# Patient Record
Sex: Female | Born: 1935 | Race: White | Hispanic: No | Marital: Married | Smoking: Never smoker
Health system: Southern US, Community
[De-identification: ages and names within clinical notes are randomized; demographics above are authoritative.]

## PROBLEM LIST (undated history)

## (undated) DIAGNOSIS — N393 Stress incontinence (female) (male): Secondary | ICD-10-CM

## (undated) DIAGNOSIS — M1990 Unspecified osteoarthritis, unspecified site: Secondary | ICD-10-CM

## (undated) DIAGNOSIS — S83289A Other tear of lateral meniscus, current injury, unspecified knee, initial encounter: Secondary | ICD-10-CM

## (undated) DIAGNOSIS — M25461 Effusion, right knee: Secondary | ICD-10-CM

## (undated) DIAGNOSIS — I10 Essential (primary) hypertension: Secondary | ICD-10-CM

## (undated) DIAGNOSIS — I739 Peripheral vascular disease, unspecified: Secondary | ICD-10-CM

## (undated) DIAGNOSIS — H269 Unspecified cataract: Secondary | ICD-10-CM

## (undated) DIAGNOSIS — Z8673 Personal history of transient ischemic attack (TIA), and cerebral infarction without residual deficits: Secondary | ICD-10-CM

## (undated) DIAGNOSIS — I341 Nonrheumatic mitral (valve) prolapse: Secondary | ICD-10-CM

## (undated) DIAGNOSIS — E785 Hyperlipidemia, unspecified: Secondary | ICD-10-CM

## (undated) DIAGNOSIS — K219 Gastro-esophageal reflux disease without esophagitis: Secondary | ICD-10-CM

## (undated) HISTORY — PX: BREAST CYST EXCISION: SHX579

## (undated) SURGERY — HEMIARTHROPLASTY, HIP, DIRECT ANTERIOR APPROACH, FOR FRACTURE
Anesthesia: Choice | Laterality: Right

---

## 1971-07-21 HISTORY — PX: VAGINAL HYSTERECTOMY: SUR661

## 1973-07-20 HISTORY — PX: CHOLECYSTECTOMY: SHX55

## 1998-10-29 ENCOUNTER — Encounter: Payer: Self-pay | Admitting: Internal Medicine

## 1998-10-29 ENCOUNTER — Ambulatory Visit (HOSPITAL_COMMUNITY): Admission: RE | Admit: 1998-10-29 | Discharge: 1998-10-29 | Payer: Self-pay | Admitting: Internal Medicine

## 1999-05-04 ENCOUNTER — Emergency Department (HOSPITAL_COMMUNITY): Admission: EM | Admit: 1999-05-04 | Discharge: 1999-05-04 | Payer: Self-pay | Admitting: Emergency Medicine

## 1999-05-27 ENCOUNTER — Encounter: Payer: Self-pay | Admitting: Internal Medicine

## 1999-05-27 ENCOUNTER — Encounter: Admission: RE | Admit: 1999-05-27 | Discharge: 1999-05-27 | Payer: Self-pay | Admitting: Internal Medicine

## 2000-01-26 ENCOUNTER — Ambulatory Visit (HOSPITAL_COMMUNITY): Admission: RE | Admit: 2000-01-26 | Discharge: 2000-01-26 | Payer: Self-pay | Admitting: Internal Medicine

## 2000-01-26 ENCOUNTER — Encounter: Payer: Self-pay | Admitting: Internal Medicine

## 2001-01-26 ENCOUNTER — Ambulatory Visit (HOSPITAL_COMMUNITY): Admission: RE | Admit: 2001-01-26 | Discharge: 2001-01-26 | Payer: Self-pay | Admitting: Internal Medicine

## 2001-01-26 ENCOUNTER — Encounter: Payer: Self-pay | Admitting: Internal Medicine

## 2002-05-19 ENCOUNTER — Encounter: Payer: Self-pay | Admitting: Family Medicine

## 2002-05-19 ENCOUNTER — Ambulatory Visit (HOSPITAL_COMMUNITY): Admission: RE | Admit: 2002-05-19 | Discharge: 2002-05-19 | Payer: Self-pay | Admitting: Family Medicine

## 2003-08-15 ENCOUNTER — Ambulatory Visit (HOSPITAL_COMMUNITY): Admission: RE | Admit: 2003-08-15 | Discharge: 2003-08-15 | Payer: Self-pay | Admitting: Internal Medicine

## 2003-10-14 ENCOUNTER — Inpatient Hospital Stay (HOSPITAL_COMMUNITY): Admission: EM | Admit: 2003-10-14 | Discharge: 2003-10-17 | Payer: Self-pay | Admitting: Emergency Medicine

## 2003-10-16 ENCOUNTER — Encounter (INDEPENDENT_AMBULATORY_CARE_PROVIDER_SITE_OTHER): Payer: Self-pay | Admitting: *Deleted

## 2004-01-24 ENCOUNTER — Ambulatory Visit (HOSPITAL_COMMUNITY): Admission: RE | Admit: 2004-01-24 | Discharge: 2004-01-24 | Payer: Self-pay | Admitting: Internal Medicine

## 2004-09-26 ENCOUNTER — Ambulatory Visit (HOSPITAL_COMMUNITY): Admission: RE | Admit: 2004-09-26 | Discharge: 2004-09-26 | Payer: Self-pay | Admitting: Internal Medicine

## 2005-06-24 ENCOUNTER — Encounter: Admission: RE | Admit: 2005-06-24 | Discharge: 2005-06-24 | Payer: Self-pay | Admitting: Internal Medicine

## 2005-10-20 ENCOUNTER — Encounter: Admission: RE | Admit: 2005-10-20 | Discharge: 2005-10-20 | Payer: Self-pay | Admitting: Internal Medicine

## 2005-11-02 ENCOUNTER — Ambulatory Visit (HOSPITAL_COMMUNITY): Admission: RE | Admit: 2005-11-02 | Discharge: 2005-11-02 | Payer: Self-pay | Admitting: Internal Medicine

## 2006-12-01 ENCOUNTER — Ambulatory Visit (HOSPITAL_COMMUNITY): Admission: RE | Admit: 2006-12-01 | Discharge: 2006-12-01 | Payer: Self-pay | Admitting: Internal Medicine

## 2006-12-28 ENCOUNTER — Encounter: Admission: RE | Admit: 2006-12-28 | Discharge: 2006-12-28 | Payer: Self-pay | Admitting: Internal Medicine

## 2008-04-11 ENCOUNTER — Ambulatory Visit (HOSPITAL_COMMUNITY): Admission: RE | Admit: 2008-04-11 | Discharge: 2008-04-11 | Payer: Self-pay | Admitting: Internal Medicine

## 2009-12-05 ENCOUNTER — Ambulatory Visit (HOSPITAL_COMMUNITY): Admission: RE | Admit: 2009-12-05 | Discharge: 2009-12-05 | Payer: Self-pay | Admitting: Internal Medicine

## 2009-12-12 ENCOUNTER — Encounter: Admission: RE | Admit: 2009-12-12 | Discharge: 2009-12-12 | Payer: Self-pay | Admitting: Internal Medicine

## 2010-08-09 ENCOUNTER — Encounter: Payer: Self-pay | Admitting: Internal Medicine

## 2010-08-10 ENCOUNTER — Encounter: Payer: Self-pay | Admitting: Internal Medicine

## 2010-12-05 NOTE — Consult Note (Signed)
NAMETAMICO, MUNDO                           ACCOUNT NO.:  [id number]   MEDICAL RECORD NO.:  [id number]                   PATIENT TYPE:  INP   LOCATION:  3030                                 FACILITY:  [facility]   PHYSICIAN:  Meade Maw, M.D.                 DATE OF BIRTH:  [birth year]   DATE OF CONSULTATION:  DATE OF DISCHARGE:                                   CONSULTATION   REFERRING PHYSICIAN:  Pramod P. Pearlean Brownie, MD   INDICATION FOR CONSULTATION:  Recent embolic stroke evaluation, per cardiac  source.   Lindsey Barnett is a 77 year old female who on October 14, 2003, presented with  sudden onset of right-sided hemiparesis. She was diagnosed with a left CVA.  Apparently she had a syncopal episode in November and December 2004, and was  never evaluated. She had no prior awareness of palpitations or  tachyarrhythmias prior to her syncopal episodes. She has had a previous  cardiac evaluation by Dr. Verdis Prime with a left heart catheterization in  1998. This was performed for chest pain and at this time she was found to  have normal course, normal EF at 76%. She has had recent fatigue and dyspnea  on exertion that has progressed over the past six months. She also notes a  generalized inferior chest tenderness almost daily. She feels that she is  increasingly fatigued at the end of a busy day. This episode of right  hemiparesis occurred while she was standing and cooking in her kitchen.  She  has been independent in performing her housework.   PAST MEDICAL HISTORY:  1. Hypertension.  2. GERD.  3. Status post cholecystectomy.  4. Status post hysterectomy .   OUTPATIENT MEDICATIONS:  1. Diovan 320 mg daily.  2. Prilosec p.r.n.  3. Aspirin 325 mg daily.  4. Premarin.   ALLERGIES:  CONTRAST DYE and CODEINE.   CURRENT MEDICATIONS:  1. Diovan 320 mg daily.  2. Restoril 2 mg p.o. q.h.s. p.r.n.  3. Tylenol p.r.n.  4. Reglan p.r.n.   FAMILY HISTORY:  Father passed in 36 from a  myocardial infarction. Mother  passed in 82 from multiple myocardial infarctions. One brother passed from  a myocardial infarction. One sister passed with myocardial infarction. She  has one son who has hypertension and dyslipidemia.   SOCIAL HISTORY:  She was married, mother of three, and retired from  __________ [place]. No history of tobacco.   REVIEW OF SYSTEMS:  She has had fatigue, chest tightness as noted, dyspnea  with exertion. No change in bowel or bladder habits. No peripheral edema.   PHYSICAL EXAMINATION:  GENERAL: A large female in no acute distress,  pleasant, conversation is appropriate.  VITAL SIGNS: Blood pressure 146/83, heart rate 55, respiratory rate 20, O2  saturation is 92% on room air.  HEENT:  Unremarkable.  NECK: Good carotid upstrokes. No carotid bruits.  PULMONARY: Breath sounds  that are equal and clear to auscultation. No use of  accessory muscles.  CARDIOVASCULAR: Normal S1 and S2. Regular rate and rhythm. No murmurs, rubs,  or gallops.  ABDOMEN: Soft and nontender.  No unusual bruits or pulsations are noted.  EXTREMITIES: No peripheral edema.  SKIN: Warm and dry.  NEUROLOGIC: Nonfocal.   ECG reveals sinus bradycardia at 57 per minute. Telemetry reveals sinus  rhythm at 50-60. Chest x-ray reveals no acute disease. CT scan of the head  is normal.  2-D echo is pending. MRA of the head reveals left hemispheric  CVA.   LABORATORY DATA:  White count 6.4, hospital course 37, platelet count  292,000. Potassium 3.6, creatinine 0.8, glucose 92, PTT 32, cysteine levels  pending. LFTs normal.   IMPRESSION:  1. Left cerebrovascular accident, treatment per radiology. The patient is     currently on IV heparin.  2. Chest pain, dyspnea on exertion. We will schedule for an adenosine     Cardiolite. Check her TSH.  3. Hypertension. Blood pressure is marginally controlled. We will continue     to monitor.  4. Syncope of questionable etiology. A stress  Cardiolite and echo as noted     above. Will defer to neurology as to whether the patient needs a carotid     study.                                               Meade Maw, M.D.    HP/MEDQ  D:  10/15/2003  T:  10/15/2003  Job:  [id number]   cc:   Pramod P. Pearlean Brownie, MD  Fax: [phone]   Lyn Records III, M.D.  [address]  [address]  [place]  [place] [address]  Fax: [phone]

## 2010-12-05 NOTE — H&P (Signed)
NAME:  TERI, LEGACY                           ACCOUNT NO.:  [id number]   MEDICAL RECORD NO.:  [id number]                   PATIENT TYPE:  INP   LOCATION:  3030                                 FACILITY:  [facility]   PHYSICIAN:  Marlan Palau, M.D.               DATE OF BIRTH:  1934-08-18   DATE OF ADMISSION:  10/14/2003  DATE OF DISCHARGE:                                HISTORY & PHYSICAL   HISTORY OF PRESENT ILLNESS:  Lindsey Barnett is a 76 year old, right handed  female born 1935-06-24 with a history of hypertension.  This patient  has been on chronic Premarin therapy and comes in today for an evaluation of  sudden onset of right sided numbness and weakness that began at home while  cooking.  The patient had maximal deficit at onset, was unable to walk  initially, denies headache, denies visual field changes and she did have  trouble with speech.  The patient gradually improved over one hour to an  hour and a half with only minimal residual tingling sensations on the left  forearm and left leg.  The patient has regained her strength and most of her  speech back.  The patient denies any similar episodes like this in the past.  CT scan of the brain is unremarkable on admission.  This patient comes into  the hospital for evaluation of a probable embolic TIA to the left brain.   PAST MEDICAL HISTORY:  1. History of new onset of the right hemiparesis/right hemisensory deficit     that is resolving, consistent with an embolic TIA.  2. Hypertension.  3. Gastroesophageal reflux disease.  4. Status post gallbladder surgery.  5. History of hysterectomy.   MEDICATIONS:  1. Diovan, unknown dose.  2. Premarin 0.9 mg daily.  3. Aspirin 81 mg a day.   ALLERGIES:  The patient states now CODEINE and IVP DYE.   She does not smoke or drink.   SOCIAL HISTORY:  This patient is married, lives in the [place] area.  She  has three children, one son age 4 with an MI.  She has another son  with  hypercholesterolemia.   FAMILY HISTORY:  Mother died with an MI.  Father died with an MI.  She has a  brother who died with an MI and a sister who died with an MI.  She has  another brother who is living with Crohn's disease.  Two sisters living, one  with multiple hip surgeries.   REVIEW OF SYMPTOMS:  Noted for no fever or chills.  The patient has had  problems with cough associated with gastroesophageal reflux disease.  She  coughs at night.  She does note some shortness of breath and dyspnea on  exertion.  She denies chest pain.  She has had pressure sensations in her  upper neck today.  She denies abdominal pain.  She has had  some problems  with constipation in the past.  She denies any trouble controlling the  bladder.  The patient has had a near syncopal or syncopal event in early  December 2004.   PHYSICAL EXAMINATION:  VITAL SIGNS:  Blood pressure 179/89; heart rate 58;  respiratory rate 18; temperature afebrile.  GENERAL:  This patient is a fairly well-developed white female who is alert  and cooperative at the time of the examination.  HEENT:  Atraumatic eyes.  Pupils equal, round and reactive to light.  Discs  are flat bilaterally.  NECK:  Supple.  No carotid bruits.  RESPIRATORY:  Clear.  CARDIOVASCULAR:  Regular rate and rhythm.  No obvious murmurs or rubs noted.  ABDOMEN:  Positive bowel sounds.  No organomegaly or tenderness noted.  EXTREMITIES:  Without significant edema.  NEUROLOGIC:  Cranial nerves as above.  Facial symmetry is present.  The  patient has good sensation and states pinprick and soft touch bilaterally.  There is good strength in the facial muscles and the muscles of the head,  trunk and shoulders bilaterally.  Speech is well enunciated and not aphasic.  Motor testing reveals 5/5 strength in all fours.  Good symmetric motor tones  noted throughout.  Sensory testing reveals minimal change in the left  forearm and left pretibial area on the right  compared to the left, but  vibratory sensation is symmetric in all fours.  The patient has good finger-  nose-finger and toe to finger bilaterally.  The patient was not ambulated.  No drift is seen.  Deep tendon reflexes remain symmetric and normal.  Toes  are downgoing bilaterally.   CT of the head is unremarkable as above.  Laboratory values reveal a sodium  of 138, potassium 3.6, chloride of 106, CO2 26, glucose 92, BUN of 10,  creatinine of 0.8, calcium 9.3, total protein 6.5, albumin 3.6, AST 27, ALT  of 19, alkaline phosphatase 45, total bili 0.8.  White count of 6.2,  hemoglobin 13.9, hematocrit 40.4, MCV of 89.5, platelets of 292.  INR of  0.9.   EKG reveals sinus bradycardia, first degree A-V block with a heart rate of  57, cannot rule out anterior infarct, __________ undetermined.  Chest x-ray  is pending.   IMPRESSION:  1. Probable transient ischemic attack event, embolic, involving the left     brain.  2. Hypertension.   This patient has had a resolving deficit at this point.  There is subjective  numbness of the right arm and leg, but for the most part, she is back to  normal.  Speech is somewhat hesitant, but not truly aphasic.  She has good  repetition, naming.  The patient will come in for further evaluation at this  point.   PLAN:  1. Admit to [facility], monitor bed.  2. MI of the brain.  3. MI angiogram intracranial and extracranial vessels.  4. A 2D echocardiogram.  5. IV heparin therapy.  I will follow the patient's course while in house.                                                Marlan Palau, M.D.    CKW/MEDQ  D:  10/14/2003  T:  10/15/2003  Job:  [id number]   cc:   Candyce Churn, M.D.  [address] [place]  [place] [address]  Fax: [phone]   [facility]  [address]  [address]

## 2010-12-05 NOTE — Discharge Summary (Signed)
NAME:  Lindsey, Barnett                           ACCOUNT NO.:  [id number]   MEDICAL RECORD NO.:  [id number]                   PATIENT TYPE:  INP   LOCATION:  3030                                 FACILITY:  [facility]   PHYSICIAN:  Pramod P. Pearlean Brownie, MD                 DATE OF BIRTH:  08/24/1934   DATE OF ADMISSION:  10/14/2003  DATE OF DISCHARGE:  10/17/2003                                 DISCHARGE SUMMARY   DIAGNOSES AT TIME OF DISCHARGE:  1. Right hemispheric transient ischemic attack.  2. Syncope, workup negative.  3. Chest pain and dyspnea on exertion, workup negative.  4. Hypokalemia, resolved.  5. Asymptomatic mild bradycardia.  6. Hyperlipidemia.  7. Hypertension.  8. Gastroesophageal reflux disease.  9. Status post cholecystectomy.  10.      Status post hysterectomy.   MEDICATIONS AT TIME OF DISCHARGE:  1. Aggrenox 1 daily x1 week, then increase to b.i.d.  2. Diovan 320 mg daily.  3. Hydrochlorothiazide 12.5 mg a day.  4. Zocor 20 mg a day.  5. Premarin as prior to admission.  6. Prilosec p.r.n.  7. Tylenol p.r.n. recommended taking prior to Aggrenox to prevent headache.   STUDIES PERFORMED:  1. CT of the head on admission was normal.  2. MRI of the brain was negative for acute infarct.  3. MRA of the brain was negative.  4. MRA of the neck was negative.  5. Chest x-ray showed no active disease.  6. Two-dimensional echocardiogram showed EF of 55% to 65% with no LV     regional wall motion abnormalities, no embolic source.  7. Transcranial Doppler not performed.  8. Carotid Doppler not performed.  9. EKG showed sinus bradycardia with first-degree A-V block, low-voltage     QRS, cannot rule out anterior infarct, age undetermined.  10.      Persantine Cardiolite shows significant gut uptake affecting     imaging, no ischemia, normal echo, no ST-T changes, no ectopy.   LABORATORY STUDIES:  Hemoglobin 13.9, hematocrit 41.2, white blood cells  7.33, platelets 323,000.   Heparin 0.57.  Chemistries normal.  Calcium  normal.  TSH normal.  Homocysteine normal.  Cholesterol 229, triglycerides  46, HDL 71 and LDL 149.  Liver function tests normal.   HISTORY OF PRESENT ILLNESS:  [name] is 76 year old right-handed  female with a history of hypertension.  She presents today for evaluation of  sudden-onset right-sided numbness and weakness that began at home while  cooking.  At maximal deficit, the patient was unable to walk initially,  denied headache, denied visual fields changes, but she did have trouble with  speech.  She gradually improved over 1 hour to 1 hour and a half with only  minimal residual tingling in her left forearm and left leg.  She has  regained most of her strength and speech back.  She denies similar episodes  in the past.  CT of the head was negative for hemorrhage.  She was admitted  for further stroke workup.  She was not a TPA or St. Jude candidate  secondary to lack of severity of symptoms.   HOSPITAL COURSE:  MRI was negative for acute stroke and it was felt that she  may have had a  TIA.  The patient was on aspirin prior to admission, so was  changed to Aggrenox for secondary stroke prevention.  Other risk factors  identified during hospitalization included hyperlipidemia for which she was  started on Zocor.  No other risk factors found.   The patient did have a history of recent syncope in the past that had not  been evaluated so a cardiology consult was called with Dr. Lyn Records,  who evaluated the patient.  Decision was to do a Persantine Cardiolite to  check for ischemia, which was negative, showing no ischemia or ectopy.  As  such, she needed no further cardiology workup.  Only addition made was that  of hydrochlorothiazide 12.5 mg daily.   CONDITION AT DISCHARGE:  Condition at discharge neurologically normal.   DISCHARGE PLAN:  1. Discharge home, no therapies needed.  2. Aggrenox for secondary stroke  prevention.  3. New statin for cholesterol control.  Will need followup in 6 weeks.  4. Follow up with Dr. Pearlean Brownie in 2 months; she is to call for an appointment.  5. Follow up with Dr. Kevan Ny in 1 month for cholesterol control.      Annie Main, N.P.                         Pramod P. Pearlean Brownie, MD    SB/MEDQ  D:  10/17/2003  T:  10/18/2003  Job:  [id number]   cc:   Pramod P. Pearlean Brownie, MD  Fax: [phone]   Candyce Churn, M.D.  [address] [place]  [place] [address]  Fax: [phone]   Lyn Records III, M.D.  [address]  [place]  [place] [address]  Fax: [phone]

## 2011-01-26 ENCOUNTER — Other Ambulatory Visit: Payer: Self-pay | Admitting: Internal Medicine

## 2011-01-26 DIAGNOSIS — Z1231 Encounter for screening mammogram for malignant neoplasm of breast: Secondary | ICD-10-CM

## 2011-01-29 ENCOUNTER — Ambulatory Visit
Admission: RE | Admit: 2011-01-29 | Discharge: 2011-01-29 | Disposition: A | Discharge status: Home / Self Care | Payer: Medicare Other | Source: Ambulatory Visit | Attending: Internal Medicine | Admitting: Internal Medicine

## 2011-01-29 DIAGNOSIS — Z1231 Encounter for screening mammogram for malignant neoplasm of breast: Secondary | ICD-10-CM

## 2011-02-03 ENCOUNTER — Other Ambulatory Visit: Payer: Self-pay | Admitting: Internal Medicine

## 2011-02-03 DIAGNOSIS — R928 Other abnormal and inconclusive findings on diagnostic imaging of breast: Secondary | ICD-10-CM

## 2011-02-04 ENCOUNTER — Ambulatory Visit
Admission: RE | Admit: 2011-02-04 | Discharge: 2011-02-04 | Disposition: A | Discharge status: Home / Self Care | Payer: Medicare Other | Source: Ambulatory Visit | Attending: Internal Medicine | Admitting: Internal Medicine

## 2011-02-04 DIAGNOSIS — R928 Other abnormal and inconclusive findings on diagnostic imaging of breast: Secondary | ICD-10-CM

## 2011-07-03 ENCOUNTER — Other Ambulatory Visit: Payer: Self-pay | Admitting: Internal Medicine

## 2011-07-03 DIAGNOSIS — N6009 Solitary cyst of unspecified breast: Secondary | ICD-10-CM

## 2011-08-05 ENCOUNTER — Ambulatory Visit
Admission: RE | Admit: 2011-08-05 | Discharge: 2011-08-05 | Disposition: A | Discharge status: Home / Self Care | Payer: Medicare Other | Source: Ambulatory Visit | Attending: Internal Medicine | Admitting: Internal Medicine

## 2011-08-05 DIAGNOSIS — N6009 Solitary cyst of unspecified breast: Secondary | ICD-10-CM

## 2011-08-31 ENCOUNTER — Other Ambulatory Visit: Payer: Self-pay | Admitting: Orthopedic Surgery

## 2011-09-11 ENCOUNTER — Encounter (HOSPITAL_BASED_OUTPATIENT_CLINIC_OR_DEPARTMENT_OTHER): Payer: Self-pay | Admitting: *Deleted

## 2011-09-11 NOTE — Progress Notes (Signed)
NPO AFTER MN. ARRIVES AT 0700. NEEDS ISTAT AND EKG. WILL TAKE PRILOSEC AM OF SURG W/ SIP OF WATER. 

## 2011-09-16 ENCOUNTER — Encounter (HOSPITAL_BASED_OUTPATIENT_CLINIC_OR_DEPARTMENT_OTHER): Payer: Self-pay | Admitting: *Deleted

## 2011-09-16 ENCOUNTER — Other Ambulatory Visit: Payer: Self-pay

## 2011-09-16 ENCOUNTER — Ambulatory Visit (HOSPITAL_BASED_OUTPATIENT_CLINIC_OR_DEPARTMENT_OTHER)
Admission: RE | Admit: 2011-09-16 | Discharge: 2011-09-16 | Disposition: A | Discharge status: Home / Self Care | Payer: Medicare Other | Source: Ambulatory Visit | Attending: Orthopedic Surgery | Admitting: Orthopedic Surgery

## 2011-09-16 ENCOUNTER — Encounter (HOSPITAL_BASED_OUTPATIENT_CLINIC_OR_DEPARTMENT_OTHER): Payer: Self-pay | Admitting: Anesthesiology

## 2011-09-16 ENCOUNTER — Encounter (HOSPITAL_BASED_OUTPATIENT_CLINIC_OR_DEPARTMENT_OTHER)
Admission: RE | Disposition: A | Discharge status: Home / Self Care | Payer: Self-pay | Source: Ambulatory Visit | Attending: Orthopedic Surgery

## 2011-09-16 ENCOUNTER — Ambulatory Visit (HOSPITAL_BASED_OUTPATIENT_CLINIC_OR_DEPARTMENT_OTHER): Payer: Medicare Other | Admitting: Anesthesiology

## 2011-09-16 DIAGNOSIS — M2240 Chondromalacia patellae, unspecified knee: Secondary | ICD-10-CM | POA: Insufficient documentation

## 2011-09-16 DIAGNOSIS — M25569 Pain in unspecified knee: Secondary | ICD-10-CM | POA: Insufficient documentation

## 2011-09-16 DIAGNOSIS — S83289A Other tear of lateral meniscus, current injury, unspecified knee, initial encounter: Secondary | ICD-10-CM | POA: Insufficient documentation

## 2011-09-16 DIAGNOSIS — K219 Gastro-esophageal reflux disease without esophagitis: Secondary | ICD-10-CM | POA: Insufficient documentation

## 2011-09-16 DIAGNOSIS — Z7982 Long term (current) use of aspirin: Secondary | ICD-10-CM | POA: Insufficient documentation

## 2011-09-16 DIAGNOSIS — E785 Hyperlipidemia, unspecified: Secondary | ICD-10-CM | POA: Insufficient documentation

## 2011-09-16 DIAGNOSIS — X58XXXA Exposure to other specified factors, initial encounter: Secondary | ICD-10-CM | POA: Insufficient documentation

## 2011-09-16 DIAGNOSIS — I10 Essential (primary) hypertension: Secondary | ICD-10-CM | POA: Insufficient documentation

## 2011-09-16 DIAGNOSIS — Z8673 Personal history of transient ischemic attack (TIA), and cerebral infarction without residual deficits: Secondary | ICD-10-CM | POA: Insufficient documentation

## 2011-09-16 DIAGNOSIS — Z79899 Other long term (current) drug therapy: Secondary | ICD-10-CM | POA: Insufficient documentation

## 2011-09-16 HISTORY — DX: Unspecified osteoarthritis, unspecified site: M19.90

## 2011-09-16 HISTORY — DX: Personal history of transient ischemic attack (TIA), and cerebral infarction without residual deficits: Z86.73

## 2011-09-16 HISTORY — DX: Hyperlipidemia, unspecified: E78.5

## 2011-09-16 HISTORY — DX: Essential (primary) hypertension: I10

## 2011-09-16 HISTORY — DX: Unspecified cataract: H26.9

## 2011-09-16 HISTORY — DX: Other tear of lateral meniscus, current injury, unspecified knee, initial encounter: S83.289A

## 2011-09-16 HISTORY — DX: Effusion, right knee: M25.461

## 2011-09-16 HISTORY — PX: KNEE ARTHROSCOPY: SHX127

## 2011-09-16 HISTORY — DX: Gastro-esophageal reflux disease without esophagitis: K21.9

## 2011-09-16 LAB — POCT I-STAT 4, (NA,K, GLUC, HGB,HCT)
Glucose, Bld: 84 mg/dL (ref 70–99)
HCT: 45 % (ref 36.0–46.0)
Hemoglobin: 15.3 g/dL — ABNORMAL HIGH (ref 12.0–15.0)
Potassium: 3.9 mEq/L (ref 3.5–5.1)
Sodium: 143 mEq/L (ref 135–145)

## 2011-09-16 SURGERY — ARTHROSCOPY, KNEE
Anesthesia: General | Site: Knee | Laterality: Right | Wound class: Clean

## 2011-09-16 MED ORDER — LIDOCAINE HCL (CARDIAC) 20 MG/ML IV SOLN
INTRAVENOUS | Status: DC | PRN
  Administered 2011-09-16: 80 mg via INTRAVENOUS

## 2011-09-16 MED ORDER — SODIUM CHLORIDE 0.9 % IR SOLN
Status: DC | PRN
  Administered 2011-09-16: 2

## 2011-09-16 MED ORDER — FENTANYL CITRATE 0.05 MG/ML IJ SOLN: 25.0000 ug | INTRAMUSCULAR | Status: DC | PRN

## 2011-09-16 MED ORDER — PROPOFOL 10 MG/ML IV EMUL
INTRAVENOUS | Status: DC | PRN
  Administered 2011-09-16: 180 mg via INTRAVENOUS

## 2011-09-16 MED ORDER — SODIUM CHLORIDE 0.9 % IV SOLN: INTRAVENOUS | Status: DC

## 2011-09-16 MED ORDER — METHOCARBAMOL 500 MG PO TABS
500.0000 mg | ORAL_TABLET | Freq: Three times a day (TID) | ORAL | Status: AC
  Administered 2011-09-16: 500 mg via ORAL

## 2011-09-16 MED ORDER — HYDROMORPHONE HCL 2 MG PO TABS
2.0000 mg | ORAL_TABLET | Freq: Four times a day (QID) | ORAL | Status: DC | PRN
  Administered 2011-09-16: 2 mg via ORAL

## 2011-09-16 MED ORDER — FENTANYL CITRATE 0.05 MG/ML IJ SOLN
INTRAMUSCULAR | Status: DC | PRN
  Administered 2011-09-16: 25 ug via INTRAVENOUS
  Administered 2011-09-16: 50 ug via INTRAVENOUS
  Administered 2011-09-16 (×2): 25 ug via INTRAVENOUS

## 2011-09-16 MED ORDER — HYDROMORPHONE HCL 2 MG PO TABS: 2.0000 mg | ORAL_TABLET | ORAL | Status: AC | PRN

## 2011-09-16 MED ORDER — DEXAMETHASONE SODIUM PHOSPHATE 10 MG/ML IJ SOLN
10.0000 mg | Freq: Once | INTRAMUSCULAR | Status: AC
  Administered 2011-09-16: 10 mg via INTRAVENOUS

## 2011-09-16 MED ORDER — METHOCARBAMOL 500 MG PO TABS: 500.0000 mg | ORAL_TABLET | Freq: Four times a day (QID) | ORAL | Status: AC

## 2011-09-16 MED ORDER — ONDANSETRON HCL 4 MG/2ML IJ SOLN
INTRAMUSCULAR | Status: DC | PRN
  Administered 2011-09-16: 4 mg via INTRAVENOUS

## 2011-09-16 MED ORDER — ACETAMINOPHEN 10 MG/ML IV SOLN
1000.0000 mg | Freq: Once | INTRAVENOUS | Status: AC
  Administered 2011-09-16: 1000 mg via INTRAVENOUS

## 2011-09-16 MED ORDER — CEFAZOLIN SODIUM 1-5 GM-% IV SOLN: 1.0000 g | INTRAVENOUS | Status: DC

## 2011-09-16 MED ORDER — TRAMADOL HCL 50 MG PO TABS: 50.0000 mg | ORAL_TABLET | Freq: Four times a day (QID) | ORAL | Status: AC | PRN

## 2011-09-16 MED ORDER — LACTATED RINGERS IV SOLN
INTRAVENOUS | Status: DC
  Administered 2011-09-16 (×2): via INTRAVENOUS

## 2011-09-16 MED ORDER — BUPIVACAINE-EPINEPHRINE 0.25% -1:200000 IJ SOLN
INTRAMUSCULAR | Status: DC | PRN
  Administered 2011-09-16: 20 mL

## 2011-09-16 MED ORDER — CHLORHEXIDINE GLUCONATE 4 % EX LIQD: 60.0000 mL | Freq: Once | CUTANEOUS | Status: DC

## 2011-09-16 MED ORDER — TRAMADOL HCL 50 MG PO TABS
50.0000 mg | ORAL_TABLET | Freq: Four times a day (QID) | ORAL | Status: DC | PRN
  Administered 2011-09-16: 50 mg via ORAL

## 2011-09-16 MED ORDER — PROMETHAZINE HCL 25 MG/ML IJ SOLN: 6.2500 mg | INTRAMUSCULAR | Status: DC | PRN

## 2011-09-16 SURGICAL SUPPLY — 36 items
BANDAGE ELASTIC 6 VELCRO ST LF (GAUZE/BANDAGES/DRESSINGS) ×2 IMPLANT
BLADE 4.2CUDA (BLADE) ×2 IMPLANT
BLADE CUDA SHAVER 3.5 (BLADE) IMPLANT
BLADE CUTTER GATOR 3.5 (BLADE) IMPLANT
CANISTER SUCT LVC 12 LTR MEDI- (MISCELLANEOUS) ×2 IMPLANT
CANISTER SUCTION 2500CC (MISCELLANEOUS) IMPLANT
CLOTH BEACON ORANGE TIMEOUT ST (SAFETY) ×2 IMPLANT
DRAPE ARTHROSCOPY W/POUCH 114 (DRAPES) ×2 IMPLANT
DRSG EMULSION OIL 3X3 NADH (GAUZE/BANDAGES/DRESSINGS) ×2 IMPLANT
DRSG PAD ABDOMINAL 8X10 ST (GAUZE/BANDAGES/DRESSINGS) ×2 IMPLANT
DURAPREP 26ML APPLICATOR (WOUND CARE) ×2 IMPLANT
ELECT MENISCUS 165MM 90D (ELECTRODE) IMPLANT
ELECT REM PT RETURN 9FT ADLT (ELECTROSURGICAL)
ELECTRODE REM PT RTRN 9FT ADLT (ELECTROSURGICAL) IMPLANT
GAUZE SPONGE 4X4 12PLY STRL LF (GAUZE/BANDAGES/DRESSINGS) ×2 IMPLANT
GLOVE BIO SURGEON STRL SZ8 (GLOVE) ×2 IMPLANT
GLOVE INDICATOR 6.5 STRL GRN (GLOVE) ×4 IMPLANT
GLOVE INDICATOR 8.0 STRL GRN (GLOVE) ×2 IMPLANT
GOWN PREVENTION PLUS LG XLONG (DISPOSABLE) ×4 IMPLANT
IV NS IRRIG 3000ML ARTHROMATIC (IV SOLUTION) ×2 IMPLANT
KNEE WRAP E Z 3 GEL PACK (MISCELLANEOUS) ×2 IMPLANT
PACK ARTHROSCOPY DSU (CUSTOM PROCEDURE TRAY) ×2 IMPLANT
PACK BASIN DAY SURGERY FS (CUSTOM PROCEDURE TRAY) ×2 IMPLANT
PADDING CAST ABS 4INX4YD NS (CAST SUPPLIES) ×1
PADDING CAST ABS COTTON 4X4 ST (CAST SUPPLIES) ×1 IMPLANT
PADDING CAST COTTON 6X4 STRL (CAST SUPPLIES) ×2 IMPLANT
PADDING WEBRIL 6 STERILE (GAUZE/BANDAGES/DRESSINGS) ×2 IMPLANT
PENCIL BUTTON HOLSTER BLD 10FT (ELECTRODE) IMPLANT
SET ARTHROSCOPY TUBING (MISCELLANEOUS) ×1
SET ARTHROSCOPY TUBING LN (MISCELLANEOUS) ×1 IMPLANT
SPONGE GAUZE 4X4 12PLY (GAUZE/BANDAGES/DRESSINGS) ×2 IMPLANT
SUT ETHILON 4 0 PS 2 18 (SUTURE) ×2 IMPLANT
TOWEL OR 17X24 6PK STRL BLUE (TOWEL DISPOSABLE) ×2 IMPLANT
WAND 30 DEG SABER W/CORD (SURGICAL WAND) IMPLANT
WAND 90 DEG TURBOVAC W/CORD (SURGICAL WAND) ×2 IMPLANT
WATER STERILE IRR 500ML POUR (IV SOLUTION) ×2 IMPLANT

## 2011-09-16 NOTE — Anesthesia Procedure Notes (Signed)
Procedure Name: LMA Insertion Date/Time: 09/16/2011 9:10 AM Performed by: Lorrin Jackson Pre-anesthesia Checklist: Patient identified, Emergency Drugs available, Suction available and Patient being monitored Patient Re-evaluated:Patient Re-evaluated prior to inductionOxygen Delivery Method: Circle System Utilized Preoxygenation: Pre-oxygenation with 100% oxygen Intubation Type: IV induction Ventilation: Mask ventilation without difficulty LMA: LMA with gastric port inserted LMA Size: 4.0 Number of attempts: 1 Placement Confirmation: positive ETCO2 Tube secured with: Tape Dental Injury: Teeth and Oropharynx as per pre-operative assessment  Comments: Inserted by Dr.Shuster

## 2011-09-16 NOTE — Anesthesia Postprocedure Evaluation (Signed)
  Anesthesia Post-op Note  Patient: Lindsey Barnett  Procedure(s) Performed: Procedure(s) (LRB): ARTHROSCOPY KNEE (Right)  Patient Location: PACU  Anesthesia Type: General  Level of Consciousness: oriented and sedated  Airway and Oxygen Therapy: Patient Spontanous Breathing  Post-op Pain: mild  Post-op Assessment: Post-op Vital signs reviewed, Patient's Cardiovascular Status Stable, Respiratory Function Stable and Patent Airway  Post-op Vital Signs: stable  Complications: No apparent anesthesia complications

## 2011-09-16 NOTE — Interval H&P Note (Signed)
History and Physical Interval Note:  09/16/2011 9:04 AM  Lindsey Barnett  has presented today for surgery, with the diagnosis of RIGHT KNEE LATERAL MENISCAL TEAR  The various methods of treatment have been discussed with the patient and family. After consideration of risks, benefits and other options for treatment, the patient has consented to  Procedure(s) (LRB): ARTHROSCOPY KNEE (Right) as a surgical intervention .  The patients' history has been reviewed, patient examined, no change in status, stable for surgery.  I have reviewed the patients' chart and labs.  Questions were answered to the patient's satisfaction.     Loanne Drilling

## 2011-09-16 NOTE — H&P (Signed)
  CC- Lindsey Barnett is a 77 y.o. female who presents with right knee pain.  HPI- . Knee Pain: Patient presents with knee pain involving the  right knee. Onset of the symptoms was several months ago. Inciting event: none known. Current symptoms include giving out, pain located laterally and stiffness. Pain is aggravated by rising after sitting, squatting and walking.  Patient has had no prior knee problems. Evaluation to date: MRI: abnormal lateral meniscal tear. Treatment to date: corticosteroid injection which was not very effective.  Past Medical History  Diagnosis Date  . Hypertension   . Hyperlipidemia   . History of TIA (transient ischemic attack) 2005--  NO RESIDUAL  . GERD (gastroesophageal reflux disease) OCCASIONAL  . Acute meniscal tear, lateral RIGHT  . Swelling of right knee joint   . Cataract immature   . Arthritis HIP    Past Surgical History  Procedure Date  . Cholecystectomy 1975  . Vaginal hysterectomy 1973    Prior to Admission medications   Medication Sig Start Date End Date Taking? Authorizing Provider  aspirin 81 MG tablet Take 81 mg by mouth daily.   Yes Historical Provider, MD  calcium carbonate (TUMS - DOSED IN MG ELEMENTAL CALCIUM) 500 MG chewable tablet Chew 1 tablet by mouth as needed.   Yes Historical Provider, MD  clopidogrel (PLAVIX) 75 MG tablet Take 75 mg by mouth daily.   Yes Historical Provider, MD  co-enzyme Q-10 30 MG capsule Take 30 mg by mouth 3 (three) times daily.   Yes Historical Provider, MD  estradiol (ESTRACE) 0.5 MG tablet Take 0.5 mg by mouth daily.   Yes Historical Provider, MD  Multiple Vitamin (MULTIVITAMIN) tablet Take 1 tablet by mouth daily.   Yes Historical Provider, MD  omeprazole (PRILOSEC) 20 MG capsule Take 20 mg by mouth as needed.   Yes Historical Provider, MD  rosuvastatin (CRESTOR) 10 MG tablet Take 10 mg by mouth daily.   Yes Historical Provider, MD  valsartan (DIOVAN) 160 MG tablet Take 160 mg by mouth daily.   Yes  Historical Provider, MD  vitamin B-12 (CYANOCOBALAMIN) 1000 MCG tablet Take 1,000 mcg by mouth 2 (two) times daily.   Yes Historical Provider, MD   Knee Exam antalgic gait, soft tissue tenderness over lateral joint line, collateral ligaments intact, negative Lachman sign, normal ipsilateral hip exam  Physical Examination: General appearance - alert, well appearing, and in no distress Mental status - alert, oriented to person, place, and time Chest - clear to auscultation, no wheezes, rales or rhonchi, symmetric air entry Heart - normal rate, regular rhythm, normal S1, S2, no murmurs, rubs, clicks or gallops Abdomen - soft, nontender, nondistended, no masses or organomegaly Neurological - alert, oriented, normal speech, no focal findings or movement disorder noted   Asessment/Plan---Right knee lateral meniscal tear- - Plan right knee arthroscopy with lateral meniscal debridement. Procedure risks and potential comps discussed with patient who elects to proceed. Goals are decreased pain and increased function with a high likelihood of achieving both

## 2011-09-16 NOTE — Op Note (Signed)
Preoperative diagnosis-  Right knee lateral meniscal tear  Postoperative diagnosis Right- knee lateral meniscal tear   Procedure- Right knee arthroscopy with lateral  Meniscal debridement   Surgeon- Gus Rankin. Vassie Kugel, MD  Anesthesia-General  EBL-  minimal Complications- None  Condition- PACU - hemodynamically stable.  Brief clinical note- -Lindsey Barnett is a 77 y.o.  female with a several month history of right knee pain and mechanical symptoms. Exam and history suggested lateral meniscal tear confirmed by MRI. The patient presents now for arthroscopy and debridement   Procedure in detail -       After successful administration of General anesthetic, a tourmiquet is placed high on the Right  thigh and the Right lower extremity is prepped and draped in the usual sterile fashion. Time out is performed by the surgical team. Standard superomedial and inferolateral portal sites are marked and incisions made with an 11 blade. The inflow cannula is passed through the superomedial portal and camera through the inferolateral portal and inflow is initiated. Arthroscopic visualization proceeds.      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 The medial and lateral gutters are visualized and there are  no loose bodies. Flexion and valgus force is applied to the knee and the medial compartment is entered. A spinal needle is passed into the joint through the site marked for the inferomedial portal. A small incision is made and the dilator passed into the joint. The findings for the medial compartment are normal .     The intercondylar notch is visualized and the ACL appears normal. 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 The tear is debrided to a stable  base with baskets and a shaver and sealed off with the Arthrocare. The cartilage on the femoral condyle was stable and did not need debridement.     The joint is again inspected and there are no other tears, defects or loose bodies identified. The arthroscopic equipment is then removed from the inferior portals which are closed with interrupted 4-0 nylon. 20 ml of .25% Marcaine with epinephrine are injected through the inflow cannula and the cannula is then removed and the portal closed with nylon. The incisions are cleaned and dried and a bulky sterile dressing is applied. The patient is then awakened and transported to recovery in stable condition.   09/16/2011, 9:42 AM

## 2011-09-16 NOTE — Anesthesia Preprocedure Evaluation (Signed)
Anesthesia Evaluation  Patient identified by MRN, date of birth, ID band Patient awake    Reviewed: Allergy & Precautions, H&P , NPO status , Patient's Chart, lab work & pertinent test results, reviewed documented beta blocker date and time   Airway Mallampati: II TM Distance: >3 FB Neck ROM: Full    Dental  (+) Teeth Intact and Dental Advisory Given   Pulmonary neg pulmonary ROS,  clear to auscultation        Cardiovascular hypertension, Pt. on medications Regular Normal Denies cardiac symptoms   Neuro/Psych Negative Neurological ROS  Negative Psych ROS   GI/Hepatic negative GI ROS, Neg liver ROS,   Endo/Other  Negative Endocrine ROS  Renal/GU negative Renal ROS  Genitourinary negative   Musculoskeletal negative musculoskeletal ROS (+)   Abdominal   Peds negative pediatric ROS (+)  Hematology negative hematology ROS (+)   Anesthesia Other Findings   Reproductive/Obstetrics negative OB ROS                           Anesthesia Physical Anesthesia Plan  ASA: II  Anesthesia Plan: General   Post-op Pain Management:    Induction: Intravenous  Airway Management Planned: LMA  Additional Equipment:   Intra-op Plan:   Post-operative Plan: Extubation in OR  Informed Consent:   Dental advisory given  Plan Discussed with: CRNA and Surgeon  Anesthesia Plan Comments:         Anesthesia Quick Evaluation

## 2011-09-16 NOTE — Transfer of Care (Signed)
Immediate Anesthesia Transfer of Care Note  Patient: Lindsey Barnett  Procedure(s) Performed: Procedure(s) (LRB): ARTHROSCOPY KNEE (Right)  Patient Location: Patient transported to PACU with oxygen via face mask at 4 Liters / Min  Anesthesia Type: General  Level of Consciousness: awake and alert   Airway & Oxygen Therapy: Patient Spontanous Breathing and Patient connected to face mask oxygen  Post-op Assessment: Report given to PACU RN and Post -op Vital signs reviewed and stable  Post vital signs: Reviewed and stable  Dentition: Teeth and oropharynx remain in pre-op condition  Complications: No apparent anesthesia complications

## 2011-09-17 ENCOUNTER — Encounter (HOSPITAL_BASED_OUTPATIENT_CLINIC_OR_DEPARTMENT_OTHER): Payer: Self-pay | Admitting: Orthopedic Surgery

## 2012-02-25 ENCOUNTER — Other Ambulatory Visit: Payer: Self-pay | Admitting: Internal Medicine

## 2012-02-25 DIAGNOSIS — N63 Unspecified lump in unspecified breast: Secondary | ICD-10-CM

## 2012-03-07 ENCOUNTER — Ambulatory Visit
Admission: RE | Admit: 2012-03-07 | Discharge: 2012-03-07 | Disposition: A | Discharge status: Home / Self Care | Payer: Medicare Other | Source: Ambulatory Visit | Attending: Internal Medicine | Admitting: Internal Medicine

## 2012-03-07 ENCOUNTER — Other Ambulatory Visit: Payer: Self-pay | Admitting: Internal Medicine

## 2012-03-07 DIAGNOSIS — N63 Unspecified lump in unspecified breast: Secondary | ICD-10-CM

## 2013-04-21 ENCOUNTER — Other Ambulatory Visit: Payer: Self-pay

## 2013-04-21 DIAGNOSIS — Z1231 Encounter for screening mammogram for malignant neoplasm of breast: Secondary | ICD-10-CM

## 2013-04-24 ENCOUNTER — Ambulatory Visit
Admission: RE | Admit: 2013-04-24 | Discharge: 2013-04-24 | Disposition: A | Discharge status: Home / Self Care | Payer: Medicare Other | Source: Ambulatory Visit

## 2013-04-24 DIAGNOSIS — Z1231 Encounter for screening mammogram for malignant neoplasm of breast: Secondary | ICD-10-CM

## 2013-08-30 ENCOUNTER — Other Ambulatory Visit: Payer: Self-pay | Admitting: Internal Medicine

## 2013-08-30 ENCOUNTER — Ambulatory Visit
Admission: RE | Admit: 2013-08-30 | Discharge: 2013-08-30 | Disposition: A | Discharge status: Home / Self Care | Payer: Medicare Other | Source: Ambulatory Visit | Attending: Internal Medicine | Admitting: Internal Medicine

## 2013-08-30 DIAGNOSIS — R05 Cough: Secondary | ICD-10-CM

## 2013-08-30 DIAGNOSIS — R059 Cough, unspecified: Secondary | ICD-10-CM

## 2014-01-02 ENCOUNTER — Other Ambulatory Visit: Payer: Self-pay | Admitting: Internal Medicine

## 2014-01-02 DIAGNOSIS — I672 Cerebral atherosclerosis: Secondary | ICD-10-CM

## 2014-01-09 ENCOUNTER — Ambulatory Visit
Admission: RE | Admit: 2014-01-09 | Discharge: 2014-01-09 | Disposition: A | Discharge status: Home / Self Care | Payer: Medicare Other | Source: Ambulatory Visit | Attending: Internal Medicine | Admitting: Internal Medicine

## 2014-01-09 DIAGNOSIS — I672 Cerebral atherosclerosis: Secondary | ICD-10-CM

## 2014-01-09 MED ORDER — GADOBENATE DIMEGLUMINE 529 MG/ML IV SOLN
15.0000 mL | Freq: Once | INTRAVENOUS | Status: AC | PRN
  Administered 2014-01-09: 15 mL via INTRAVENOUS

## 2014-05-18 ENCOUNTER — Other Ambulatory Visit: Payer: Self-pay

## 2014-05-18 DIAGNOSIS — Z1231 Encounter for screening mammogram for malignant neoplasm of breast: Secondary | ICD-10-CM

## 2014-06-05 ENCOUNTER — Ambulatory Visit: Payer: Medicare Other

## 2014-08-02 ENCOUNTER — Ambulatory Visit
Admission: RE | Admit: 2014-08-02 | Discharge: 2014-08-02 | Disposition: A | Discharge status: Home / Self Care | Payer: Medicare Other | Source: Ambulatory Visit

## 2014-08-02 DIAGNOSIS — Z1231 Encounter for screening mammogram for malignant neoplasm of breast: Secondary | ICD-10-CM

## 2015-01-14 ENCOUNTER — Telehealth: Payer: Self-pay | Admitting: Surgery

## 2015-01-14 ENCOUNTER — Encounter (HOSPITAL_COMMUNITY): Payer: Self-pay | Admitting: Emergency Medicine

## 2015-01-14 ENCOUNTER — Emergency Department (HOSPITAL_COMMUNITY)
Admission: EM | Admit: 2015-01-14 | Discharge: 2015-01-14 | Disposition: A | Discharge status: Home / Self Care | Payer: Medicare Other | Attending: Emergency Medicine | Admitting: Emergency Medicine

## 2015-01-14 ENCOUNTER — Emergency Department (HOSPITAL_COMMUNITY): Payer: Medicare Other

## 2015-01-14 DIAGNOSIS — M1990 Unspecified osteoarthritis, unspecified site: Secondary | ICD-10-CM | POA: Insufficient documentation

## 2015-01-14 DIAGNOSIS — E785 Hyperlipidemia, unspecified: Secondary | ICD-10-CM | POA: Insufficient documentation

## 2015-01-14 DIAGNOSIS — I10 Essential (primary) hypertension: Secondary | ICD-10-CM | POA: Insufficient documentation

## 2015-01-14 DIAGNOSIS — K219 Gastro-esophageal reflux disease without esophagitis: Secondary | ICD-10-CM | POA: Insufficient documentation

## 2015-01-14 DIAGNOSIS — Z7982 Long term (current) use of aspirin: Secondary | ICD-10-CM | POA: Diagnosis not present

## 2015-01-14 DIAGNOSIS — M62838 Other muscle spasm: Secondary | ICD-10-CM | POA: Diagnosis not present

## 2015-01-14 DIAGNOSIS — Z79899 Other long term (current) drug therapy: Secondary | ICD-10-CM | POA: Insufficient documentation

## 2015-01-14 DIAGNOSIS — Z8673 Personal history of transient ischemic attack (TIA), and cerebral infarction without residual deficits: Secondary | ICD-10-CM | POA: Diagnosis not present

## 2015-01-14 DIAGNOSIS — Z7902 Long term (current) use of antithrombotics/antiplatelets: Secondary | ICD-10-CM | POA: Diagnosis not present

## 2015-01-14 DIAGNOSIS — M542 Cervicalgia: Secondary | ICD-10-CM | POA: Diagnosis present

## 2015-01-14 MED ORDER — KETOROLAC TROMETHAMINE 30 MG/ML IJ SOLN: 30.0000 mg | Freq: Once | INTRAMUSCULAR | Status: DC

## 2015-01-14 MED ORDER — MORPHINE SULFATE 4 MG/ML IJ SOLN
4.0000 mg | Freq: Once | INTRAMUSCULAR | Status: AC
  Administered 2015-01-14: 4 mg via INTRAMUSCULAR
  Filled 2015-01-14: qty 1

## 2015-01-14 MED ORDER — HYDROCODONE-ACETAMINOPHEN 5-325 MG PO TABS: 1.0000 | ORAL_TABLET | ORAL | Status: DC | PRN

## 2015-01-14 MED ORDER — DIAZEPAM 5 MG PO TABS
5.0000 mg | ORAL_TABLET | Freq: Once | ORAL | Status: AC
  Administered 2015-01-14: 5 mg via ORAL
  Filled 2015-01-14: qty 1

## 2015-01-14 NOTE — ED Notes (Signed)
Pt reports waking with neck pain yesterday morning. States took 4 ibuprofen yesterday morning with little relief and called her doctor. Pts doctor wrote for flexeril. Pt denies relief of symptoms. States it hurts to turn her head. Denies fever. Pt awake, alert, oriented x4, ambulatory.

## 2015-01-14 NOTE — Care Management (Signed)
ED CM received call concerning missing prescription from ED visit today. While on phone prescription located. No further ED CM needs identified.

## 2015-01-14 NOTE — ED Provider Notes (Signed)
CSN: [id number]     Arrival date & time 01/14/15  0930 History   First MD Initiated Contact with Patient 01/14/15 0945     Chief Complaint  Patient presents with  . Neck Pain     (Consider location/radiation/quality/duration/timing/severity/associated sxs/prior Treatment) The history is provided by the patient.     Pt p/w left sided/posterior neck pain the she woke up with yesterday morning.  Pain is "excrutiating" with any movement, cough, or bump in the road while in the car.  Radiates into her left scalp with any movement.  Has had associated mild tingling in her left fingers.  Has been using heating pad and flexeril from PCP office.  Has used three doses of flexeril 10mg  with no change in pain.  Denies trauma, injury, weakness of the extremities, hx neck pain, fever, CP, SOB.    Past Medical History  Diagnosis Date  . Hypertension   . Hyperlipidemia   . History of TIA (transient ischemic attack) 2005--  NO RESIDUAL  . GERD (gastroesophageal reflux disease) OCCASIONAL  . Acute meniscal tear, lateral RIGHT  . Swelling of right knee joint   . Cataract immature   . Arthritis HIP   Past Surgical History  Procedure Laterality Date  . Cholecystectomy  1975  . Vaginal hysterectomy  1973  . Knee arthroscopy  09/16/2011    Procedure: ARTHROSCOPY KNEE;  Surgeon: Gearlean Alf, MD;  Location: [facility];  Service: Orthopedics;  Laterality: Right;  WITH lateral meniscal debridement   History reviewed. No pertinent family history. History  Substance Use Topics  . Smoking status: Never Smoker   . Smokeless tobacco: Never Used  . Alcohol Use: Yes     Comment: OCCASIONAL   OB History    No data available     Review of Systems  Constitutional: Negative for fever and chills.  Respiratory: Negative for shortness of breath.   Cardiovascular: Negative for chest pain.  Musculoskeletal: Positive for neck pain and neck stiffness. Negative for myalgias, back pain and  arthralgias.  Skin: Negative for color change and wound.  Allergic/Immunologic: Negative for immunocompromised state.  Neurological: Negative for weakness and numbness.  Hematological: Does not bruise/bleed easily.  Psychiatric/Behavioral: Negative for self-injury.      Allergies  Codeine; Contrast media; and Sodium tetradecyl sulfate  Home Medications   Prior to Admission medications   Medication Sig Start Date End Date Taking? Authorizing Provider  aspirin 81 MG tablet Take 81 mg by mouth daily.   Yes Historical Provider, MD  calcium carbonate (TUMS - DOSED IN MG ELEMENTAL CALCIUM) 500 MG chewable tablet Chew 1 tablet by mouth as needed.   Yes Historical Provider, MD  clopidogrel (PLAVIX) 75 MG tablet Take 75 mg by mouth daily.   Yes Historical Provider, MD  co-enzyme Q-10 30 MG capsule Take 30 mg by mouth daily.    Yes Historical Provider, MD  cyclobenzaprine (FLEXERIL) 10 MG tablet Take 10 mg by mouth 3 (three) times daily as needed for muscle spasms.   Yes Historical Provider, MD  estradiol (ESTRACE) 0.5 MG tablet Take 0.5 mg by mouth daily.   Yes Historical Provider, MD  furosemide (LASIX) 20 MG tablet Take 20 mg by mouth daily.   Yes Historical Provider, MD  Multiple Vitamin (MULTIVITAMIN) tablet Take 1 tablet by mouth at bedtime.    Yes Historical Provider, MD  omeprazole (PRILOSEC) 20 MG capsule Take 20 mg by mouth as needed (acid reflux).    Yes Historical Provider,  MD  rosuvastatin (CRESTOR) 10 MG tablet Take 10 mg by mouth 3 (three) times a week. mon wed fri   Yes Historical Provider, MD  valsartan (DIOVAN) 160 MG tablet Take 160 mg by mouth daily.   Yes Historical Provider, MD  vitamin B-12 (CYANOCOBALAMIN) 1000 MCG tablet Take 1,000 mcg by mouth 2 (two) times daily.    Historical Provider, MD   BP 147/73 mmHg  Pulse 59  Temp(Src) 97.4 F (36.3 C) (Oral)  Resp 16  SpO2 99% Physical Exam  Constitutional: She appears well-developed and well-nourished. No distress.    HENT:  Head: Normocephalic and atraumatic.    Neck: Neck supple.  Pulmonary/Chest: Effort normal.  Musculoskeletal:       Cervical back: She exhibits decreased range of motion, swelling, pain and spasm. She exhibits no bony tenderness, no deformity and no laceration.  Spine nontender, no crepitus, or stepoffs.  Upper extremities:  Strength 5/5, sensation intact, distal pulses intact.     Neurological: She is alert.  Skin: She is not diaphoretic.  Nursing note and vitals reviewed.   ED Course  Procedures (including critical care time) Labs Review Labs Reviewed - No data to display  Imaging Review Dg Cervical Spine Complete  01/14/2015   CLINICAL DATA:  Initial encounter for left neck pain with no known injury.  EXAM: CERVICAL SPINE  4+ VIEWS  COMPARISON:  No comparison studies available.  FINDINGS: Mild loss of disc height is seen at C4-5 C5-6. The trace retrolisthesis at C4-C5 is compatible with the lumbar the disc 9. Facets are well aligned bilaterally. No substantial facet osteoarthritis. No bony neural foraminal encroachment on either side. Normal cervical lordosis is preserved. No prevertebral soft tissue swelling.  IMPRESSION: Loss of disc height at C4-5 and C5-6.  Otherwise unremarkable.   Electronically Signed   By: Misty Stanley M.D.   On: 01/14/2015 11:33     EKG Interpretation None      MDM   Final diagnoses:  Neck muscle spasm    Afebrile, nontoxic patient with left sided neck pain and muscle spasm without injury.  Neurovascularly intact.  Definite muscle spasm on exam.  Xray remarkable for loss of disc height C4-5, C5-6.   D/C home with ice pack, norco, PCP follow up.  Discussed prescription options with patient and husband at length given patient's prior reaction to codeine several decades ago and also given her age. Pt advised of the increased risk of falls.  Discussed result, findings, treatment, and follow up  with patient.  Pt given return precautions.  Pt  verbalizes understanding and agrees with plan.       Clayton Bibles, PA-C 01/14/15 [place], MD 01/15/15 [phone]

## 2015-01-14 NOTE — Discharge Instructions (Signed)
Read the information below.  Use the prescribed medication as directed.  Please discuss all new medications with your pharmacist.  Do not take additional tylenol while taking the prescribed pain medication to avoid overdose.  You may return to the Emergency Department at any time for worsening condition or any new symptoms that concern you.   If you develop fevers, loss of control of bowel or bladder, weakness or numbness in your arms or legs, or are unable to walk, return to the ER for a recheck.    Muscle Cramps and Spasms Muscle cramps and spasms occur when a muscle or muscles tighten and you have no control over this tightening (involuntary muscle contraction). They are a common problem and can develop in any muscle. The most common place is in the calf muscles of the leg. Both muscle cramps and muscle spasms are involuntary muscle contractions, but they also have differences:   Muscle cramps are sporadic and painful. They may last a few seconds to a quarter of an hour. Muscle cramps are often more forceful and last longer than muscle spasms.  Muscle spasms may or may not be painful. They may also last just a few seconds or much longer. CAUSES  It is uncommon for cramps or spasms to be due to a serious underlying problem. In many cases, the cause of cramps or spasms is unknown. Some common causes are:   Overexertion.   Overuse from repetitive motions (doing the same thing over and over).   Remaining in a certain position for a long period of time.   Improper preparation, form, or technique while performing a sport or activity.   Dehydration.   Injury.   Side effects of some medicines.   Abnormally low levels of the salts and ions in your blood (electrolytes), especially potassium and calcium. This could happen if you are taking water pills (diuretics) or you are pregnant.  Some underlying medical problems can make it more likely to develop cramps or spasms. These include, but  are not limited to:   Diabetes.   Parkinson disease.   Hormone disorders, such as thyroid problems.   Alcohol abuse.   Diseases specific to muscles, joints, and bones.   Blood vessel disease where not enough blood is getting to the muscles.  HOME CARE INSTRUCTIONS   Stay well hydrated. Drink enough water and fluids to keep your urine clear or pale yellow.  It may be helpful to massage, stretch, and relax the affected muscle.  For tight or tense muscles, use a warm towel, heating pad, or hot shower water directed to the affected area.  If you are sore or have pain after a cramp or spasm, applying ice to the affected area may relieve discomfort.  Put ice in a plastic bag.  Place a towel between your skin and the bag.  Leave the ice on for 15-20 minutes, 03-04 times a day.  Medicines used to treat a known cause of cramps or spasms may help reduce their frequency or severity. Only take over-the-counter or prescription medicines as directed by your caregiver. SEEK MEDICAL CARE IF:  Your cramps or spasms get more severe, more frequent, or do not improve over time.  MAKE SURE YOU:   Understand these instructions.  Will watch your condition.  Will get help right away if you are not doing well or get worse. Document Released: 12/26/2001 Document Revised: 10/31/2012 Document Reviewed: 06/22/2012 [facility] Patient Information 2015 [place], [place]. This information is not intended to replace  advice given to you by your health care provider. Make sure you discuss any questions you have with your health care provider.

## 2015-08-20 ENCOUNTER — Other Ambulatory Visit: Payer: Self-pay | Admitting: Internal Medicine

## 2015-08-20 ENCOUNTER — Ambulatory Visit
Admission: RE | Admit: 2015-08-20 | Discharge: 2015-08-20 | Disposition: A | Discharge status: Home / Self Care | Payer: Medicare Other | Source: Ambulatory Visit | Attending: Internal Medicine | Admitting: Internal Medicine

## 2015-08-20 DIAGNOSIS — M545 Low back pain: Secondary | ICD-10-CM

## 2015-09-21 ENCOUNTER — Emergency Department (HOSPITAL_COMMUNITY): Payer: Medicare Other

## 2015-09-21 ENCOUNTER — Encounter (HOSPITAL_COMMUNITY): Payer: Self-pay | Admitting: Emergency Medicine

## 2015-09-21 ENCOUNTER — Inpatient Hospital Stay (HOSPITAL_COMMUNITY)
Admission: EM | Admit: 2015-09-21 | Discharge: 2015-09-24 | DRG: 470 | Disposition: A | Discharge status: Home Health | Payer: Medicare Other | Attending: Internal Medicine | Admitting: Internal Medicine

## 2015-09-21 DIAGNOSIS — K219 Gastro-esophageal reflux disease without esophagitis: Secondary | ICD-10-CM

## 2015-09-21 DIAGNOSIS — S72009A Fracture of unspecified part of neck of unspecified femur, initial encounter for closed fracture: Secondary | ICD-10-CM | POA: Diagnosis present

## 2015-09-21 DIAGNOSIS — S79919A Unspecified injury of unspecified hip, initial encounter: Secondary | ICD-10-CM | POA: Insufficient documentation

## 2015-09-21 DIAGNOSIS — Z8673 Personal history of transient ischemic attack (TIA), and cerebral infarction without residual deficits: Secondary | ICD-10-CM

## 2015-09-21 DIAGNOSIS — R001 Bradycardia, unspecified: Secondary | ICD-10-CM | POA: Diagnosis present

## 2015-09-21 DIAGNOSIS — I10 Essential (primary) hypertension: Secondary | ICD-10-CM

## 2015-09-21 DIAGNOSIS — H269 Unspecified cataract: Secondary | ICD-10-CM | POA: Diagnosis present

## 2015-09-21 DIAGNOSIS — Z419 Encounter for procedure for purposes other than remedying health state, unspecified: Secondary | ICD-10-CM

## 2015-09-21 DIAGNOSIS — M1990 Unspecified osteoarthritis, unspecified site: Secondary | ICD-10-CM | POA: Diagnosis present

## 2015-09-21 DIAGNOSIS — E785 Hyperlipidemia, unspecified: Secondary | ICD-10-CM | POA: Diagnosis present

## 2015-09-21 DIAGNOSIS — Z79899 Other long term (current) drug therapy: Secondary | ICD-10-CM | POA: Diagnosis not present

## 2015-09-21 DIAGNOSIS — M25551 Pain in right hip: Secondary | ICD-10-CM | POA: Diagnosis present

## 2015-09-21 DIAGNOSIS — Y92009 Unspecified place in unspecified non-institutional (private) residence as the place of occurrence of the external cause: Secondary | ICD-10-CM

## 2015-09-21 DIAGNOSIS — W07XXXA Fall from chair, initial encounter: Secondary | ICD-10-CM | POA: Diagnosis present

## 2015-09-21 DIAGNOSIS — E876 Hypokalemia: Secondary | ICD-10-CM | POA: Diagnosis not present

## 2015-09-21 DIAGNOSIS — D649 Anemia, unspecified: Secondary | ICD-10-CM | POA: Diagnosis present

## 2015-09-21 DIAGNOSIS — S72011A Unspecified intracapsular fracture of right femur, initial encounter for closed fracture: Secondary | ICD-10-CM | POA: Diagnosis present

## 2015-09-21 DIAGNOSIS — S72019A Unspecified intracapsular fracture of unspecified femur, initial encounter for closed fracture: Secondary | ICD-10-CM | POA: Diagnosis present

## 2015-09-21 DIAGNOSIS — S72001A Fracture of unspecified part of neck of right femur, initial encounter for closed fracture: Secondary | ICD-10-CM | POA: Diagnosis not present

## 2015-09-21 DIAGNOSIS — Z01811 Encounter for preprocedural respiratory examination: Secondary | ICD-10-CM

## 2015-09-21 DIAGNOSIS — Z7902 Long term (current) use of antithrombotics/antiplatelets: Secondary | ICD-10-CM | POA: Diagnosis not present

## 2015-09-21 DIAGNOSIS — S79911A Unspecified injury of right hip, initial encounter: Secondary | ICD-10-CM

## 2015-09-21 LAB — TYPE AND SCREEN
ABO/RH(D): A POS
Antibody Screen: NEGATIVE

## 2015-09-21 LAB — CBC WITH DIFFERENTIAL/PLATELET
BASOS ABS: 0 10*3/uL (ref 0.0–0.1)
BASOS PCT: 0 %
Eosinophils Absolute: 0.1 10*3/uL (ref 0.0–0.7)
Eosinophils Relative: 1 %
HEMATOCRIT: 37.7 % (ref 36.0–46.0)
HEMOGLOBIN: 12.4 g/dL (ref 12.0–15.0)
Lymphocytes Relative: 10 %
Lymphs Abs: 1.2 10*3/uL (ref 0.7–4.0)
MCH: 29.2 pg (ref 26.0–34.0)
MCHC: 32.9 g/dL (ref 30.0–36.0)
MCV: 88.9 fL (ref 78.0–100.0)
MONO ABS: 0.8 10*3/uL (ref 0.1–1.0)
Monocytes Relative: 6 %
NEUTROS ABS: 10.5 10*3/uL — AB (ref 1.7–7.7)
NEUTROS PCT: 83 %
Platelets: 269 10*3/uL (ref 150–400)
RBC: 4.24 MIL/uL (ref 3.87–5.11)
RDW: 14.5 % (ref 11.5–15.5)
WBC: 12.7 10*3/uL — ABNORMAL HIGH (ref 4.0–10.5)

## 2015-09-21 LAB — BASIC METABOLIC PANEL
ANION GAP: 12 (ref 5–15)
BUN: 18 mg/dL (ref 6–20)
CALCIUM: 9.3 mg/dL (ref 8.9–10.3)
CO2: 26 mmol/L (ref 22–32)
Chloride: 104 mmol/L (ref 101–111)
Creatinine, Ser: 0.88 mg/dL (ref 0.44–1.00)
Glucose, Bld: 136 mg/dL — ABNORMAL HIGH (ref 65–99)
Potassium: 3.4 mmol/L — ABNORMAL LOW (ref 3.5–5.1)
Sodium: 142 mmol/L (ref 135–145)

## 2015-09-21 LAB — PROTIME-INR
INR: 0.96 (ref 0.00–1.49)
PROTHROMBIN TIME: 13 s (ref 11.6–15.2)

## 2015-09-21 MED ORDER — HYDROMORPHONE HCL 1 MG/ML IJ SOLN
1.0000 mg | Freq: Once | INTRAMUSCULAR | Status: AC
  Administered 2015-09-22: 1 mg via INTRAVENOUS
  Filled 2015-09-21: qty 1

## 2015-09-21 MED ORDER — ONDANSETRON HCL 4 MG/2ML IJ SOLN
4.0000 mg | Freq: Once | INTRAMUSCULAR | Status: AC
  Administered 2015-09-21: 4 mg via INTRAVENOUS
  Filled 2015-09-21: qty 2

## 2015-09-21 MED ORDER — FENTANYL CITRATE (PF) 100 MCG/2ML IJ SOLN
50.0000 ug | INTRAMUSCULAR | Status: AC | PRN
  Administered 2015-09-21 (×2): 50 ug via INTRAVENOUS
  Filled 2015-09-21 (×2): qty 2

## 2015-09-21 NOTE — ED Provider Notes (Signed)
CSN: [id number]     Arrival date & time 09/21/15  2023 History   First MD Initiated Contact with Patient 09/21/15 2029     Chief Complaint  Patient presents with  . Fall     (Consider location/radiation/quality/duration/timing/severity/associated sxs/prior Treatment) HPI  81 year old female presents after a fall off a stool. She was climbing down and missed a step. Landed on left side. Did not hit head. No headache. Severe right leg/hip pain. Unable to move it due to pain. Nauseated, no vomiting. Pain somewhat better after fentanyl and zofran by EMS. No weakness/numbness.  Past Medical History  Diagnosis Date  . Hypertension   . Hyperlipidemia   . History of TIA (transient ischemic attack) 2005--  NO RESIDUAL  . GERD (gastroesophageal reflux disease) OCCASIONAL  . Acute meniscal tear, lateral RIGHT  . Swelling of right knee joint   . Cataract immature   . Arthritis HIP   Past Surgical History  Procedure Laterality Date  . Cholecystectomy  1975  . Vaginal hysterectomy  1973  . Knee arthroscopy  09/16/2011    Procedure: ARTHROSCOPY KNEE;  Surgeon: Gearlean Alf, MD;  Location: [facility];  Service: Orthopedics;  Laterality: Right;  WITH lateral meniscal debridement   No family history on file. Social History  Substance Use Topics  . Smoking status: Never Smoker   . Smokeless tobacco: Never Used  . Alcohol Use: Yes     Comment: OCCASIONAL   OB History    No data available     Review of Systems  Cardiovascular: Negative for chest pain.  Gastrointestinal: Positive for nausea. Negative for vomiting and abdominal pain.  Musculoskeletal: Positive for arthralgias.  Neurological: Negative for dizziness, syncope, weakness, numbness and headaches.  All other systems reviewed and are negative.     Allergies  Codeine; Contrast media; and Sodium tetradecyl sulfate  Home Medications   Prior to Admission medications   Medication Sig Start Date End Date  Taking? Authorizing Provider  aspirin 81 MG tablet Take 81 mg by mouth daily.    Historical Provider, MD  calcium carbonate (TUMS - DOSED IN MG ELEMENTAL CALCIUM) 500 MG chewable tablet Chew 1 tablet by mouth as needed.    Historical Provider, MD  clopidogrel (PLAVIX) 75 MG tablet Take 75 mg by mouth daily.    Historical Provider, MD  co-enzyme Q-10 30 MG capsule Take 30 mg by mouth daily.     Historical Provider, MD  cyclobenzaprine (FLEXERIL) 10 MG tablet Take 10 mg by mouth 3 (three) times daily as needed for muscle spasms.    Historical Provider, MD  estradiol (ESTRACE) 0.5 MG tablet Take 0.5 mg by mouth daily.    Historical Provider, MD  furosemide (LASIX) 20 MG tablet Take 20 mg by mouth daily.    Historical Provider, MD  HYDROcodone-acetaminophen (NORCO/VICODIN) 5-325 MG per tablet Take 1-2 tablets by mouth every 4 (four) hours as needed for moderate pain or severe pain. 01/14/15   Clayton Bibles, PA-C  Multiple Vitamin (MULTIVITAMIN) tablet Take 1 tablet by mouth at bedtime.     Historical Provider, MD  omeprazole (PRILOSEC) 20 MG capsule Take 20 mg by mouth as needed (acid reflux).     Historical Provider, MD  rosuvastatin (CRESTOR) 10 MG tablet Take 10 mg by mouth 3 (three) times a week. mon wed fri    Historical Provider, MD  valsartan (DIOVAN) 160 MG tablet Take 160 mg by mouth daily.    Historical Provider, MD  vitamin B-12 (  CYANOCOBALAMIN) 1000 MCG tablet Take 1,000 mcg by mouth 2 (two) times daily.    Historical Provider, MD   BP 123/61 mmHg  Pulse 58  Temp(Src) 97.5 F (36.4 C) (Oral)  Resp 18  Ht 5\' 7"  (1.702 m)  Wt 170 lb (77.111 kg)  BMI 26.62 kg/m2  SpO2 95% Physical Exam  Constitutional: She is oriented to person, place, and time. She appears well-developed and well-nourished.  Laying in left lateral decubitus position  HENT:  Head: Normocephalic and atraumatic.  Right Ear: External ear normal.  Left Ear: External ear normal.  Nose: Nose normal.  Eyes: Right eye  exhibits no discharge. Left eye exhibits no discharge.  Cardiovascular: Normal rate, regular rhythm and normal heart sounds.   Pulses:      Dorsalis pedis pulses are 2+ on the right side, and 2+ on the left side.  Pulmonary/Chest: Effort normal and breath sounds normal.  Abdominal: Soft. She exhibits no distension. There is no tenderness.  Musculoskeletal:       Right hip: She exhibits decreased range of motion and tenderness.       Right knee: No tenderness found.       Right upper leg: She exhibits no tenderness.  Neurological: She is alert and oriented to person, place, and time.  Skin: Skin is warm and dry.  Nursing note and vitals reviewed.   ED Course  Procedures (including critical care time) Labs Review Labs Reviewed  BASIC METABOLIC PANEL - Abnormal; Notable for the following:    Potassium 3.4 (*)    Glucose, Bld 136 (*)    All other components within normal limits  CBC WITH DIFFERENTIAL/PLATELET - Abnormal; Notable for the following:    WBC 12.7 (*)    Neutro Abs 10.5 (*)    All other components within normal limits  PROTIME-INR  TYPE AND SCREEN  ABO/RH    Imaging Review Dg Hip Unilat With Pelvis 2-3 Views Right  09/21/2015  CLINICAL DATA:  Fall. EXAM: DG HIP (WITH OR WITHOUT PELVIS) 2-3V RIGHT COMPARISON:  None. FINDINGS: There is an acute subcapital femoral neck fracture identified involving the right proximal femur. No dislocations identified. IMPRESSION: 1. Acute subcapital femoral neck fracture involves the proximal right femur. Electronically Signed   By: Kerby Moors M.D.   On: 09/21/2015 21:32   I have personally reviewed and evaluated these images and lab results as part of my medical decision-making.   EKG Interpretation None      MDM   Final diagnoses:  Subcapital fracture of hip, right, closed, initial encounter [facility])    Patient with a mechanical fall and subsequent subcapital right hip fracture. Discussed with Dr. Berenice Primas who will come and  evaluate patient for operation for tomorrow morning. She will be kept nothing by mouth after midnight. Dr. Eulas Post of the hospitalist will admit. No head injury or headache.    Sherwood Gambler, MD 09/21/15 [phone]

## 2015-09-21 NOTE — Consult Note (Signed)
Reason for Consult:right femoral neck fracture Referring Physician: Hospitalists  Lindsey Barnett is an 81 y.o. female.  HPI: the patient is a 81 year old female with a history of following her today.  She suffered a right femoral neck fracture with displacement.  She'll be admitted to the medical service and we are consult for management of her femoral neck fracture.  Past Medical History  Diagnosis Date  . Hypertension   . Hyperlipidemia   . History of TIA (transient ischemic attack) 2005--  NO RESIDUAL  . GERD (gastroesophageal reflux disease) OCCASIONAL  . Acute meniscal tear, lateral RIGHT  . Swelling of right knee joint   . Cataract immature   . Arthritis HIP    Past Surgical History  Procedure Laterality Date  . Cholecystectomy  1975  . Vaginal hysterectomy  1973  . Knee arthroscopy  09/16/2011    Procedure: ARTHROSCOPY KNEE;  Surgeon: Gearlean Alf, MD;  Location: [facility];  Service: Orthopedics;  Laterality: Right;  WITH lateral meniscal debridement    No family history on file.  Social History:  reports that she has never smoked. She has never used smokeless tobacco. She reports that she drinks alcohol. She reports that she does not use illicit drugs.  Allergies:  Allergies  Allergen Reactions  . Codeine Other (See Comments)    STOMACHE SPASMS  . Contrast Media [Iodinated Diagnostic Agents] Hives  . Iodine Other (See Comments)    Hives?  . Sodium Tetradecyl Sulfate Other (See Comments)    Medications: I have reviewed the patient's current medications.  No results found for this or any previous visit (from the past 48 hour(s)).  Dg Hip Unilat With Pelvis 2-3 Views Right  09/21/2015  CLINICAL DATA:  Fall. EXAM: DG HIP (WITH OR WITHOUT PELVIS) 2-3V RIGHT COMPARISON:  None. FINDINGS: There is an acute subcapital femoral neck fracture identified involving the right proximal femur. No dislocations identified. IMPRESSION: 1. Acute subcapital femoral  neck fracture involves the proximal right femur. Electronically Signed   By: Kerby Moors M.D.   On: 09/21/2015 21:32    ROS  ROS: I have reviewed the patient's review of systems thoroughly and there are no positive responses as relates to the HPI. EXAM: Blood pressure 120/61, pulse 55, temperature 97.5 F (36.4 C), temperature source Oral, resp. rate 12, height 5\' 7"  (1.702 m), weight 77.111 kg (170 lb), SpO2 99 %. Physical Exam Well-developed well-nourished patient in no acute distress. Alert and oriented x3 HEENT:within normal limits Cardiac: Regular rate and rhythm Pulmonary: Lungs clear to auscultation Abdomen: Soft and nontender.  Normal active bowel sounds  Musculoskeletal: (right hip actually rotated and shortened.  Pain with all range of motion.) Assessment/Plan: 81 year old female who fell today suffering a femoral neck fracture.//I think at this point hemiarthroplasty from an anterior approach is appropriate.  I will allow her to be admitted to medicine and cleared.  I will put her on the operating room schedule for once her clearance has been achieved.  Currently I plan on doing her Sunday morning at 7:30.I have had a prolonged discussion with the patient regarding the risk and benefits of the surgical procedure.  The patient understands the risks include but are not limited to bleeding, infection and failure of the surgery to cure the problem and need for further surgery.  The patient understands there is a slight risk of death at the time of surgery.  The patient understands these risks along with the potential benefits and wishes  to proceed with surgical intervention.   The patient will be followed in the office in the postoperative period.  Lindsey Barnett 09/21/2015, 10:10 PM

## 2015-09-21 NOTE — ED Notes (Signed)
Per EMS, pt fell from a 2 step stool and landed on right side.  She is unable to place any pressure on left side, EMS bound her hips.  250mg  of fentenal was given in route along w/ 4 of zofran, pain from a 9 to a 5.  Pt on plavix for hx of stroke.

## 2015-09-21 NOTE — ED Notes (Signed)
Patient transported to X-ray 

## 2015-09-22 ENCOUNTER — Inpatient Hospital Stay: Admit: 2015-09-22 | Payer: Self-pay | Admitting: Orthopedic Surgery

## 2015-09-22 ENCOUNTER — Inpatient Hospital Stay (HOSPITAL_COMMUNITY): Payer: Medicare Other

## 2015-09-22 ENCOUNTER — Encounter (HOSPITAL_COMMUNITY)
Admission: EM | Disposition: A | Discharge status: Home Health | Payer: Self-pay | Source: Home / Self Care | Attending: Internal Medicine

## 2015-09-22 ENCOUNTER — Inpatient Hospital Stay (HOSPITAL_COMMUNITY): Payer: Medicare Other | Admitting: Anesthesiology

## 2015-09-22 ENCOUNTER — Encounter (HOSPITAL_COMMUNITY): Payer: Self-pay | Admitting: Anesthesiology

## 2015-09-22 DIAGNOSIS — S72001A Fracture of unspecified part of neck of right femur, initial encounter for closed fracture: Secondary | ICD-10-CM

## 2015-09-22 DIAGNOSIS — S72009A Fracture of unspecified part of neck of unspecified femur, initial encounter for closed fracture: Secondary | ICD-10-CM | POA: Diagnosis present

## 2015-09-22 DIAGNOSIS — S79911A Unspecified injury of right hip, initial encounter: Secondary | ICD-10-CM

## 2015-09-22 DIAGNOSIS — E876 Hypokalemia: Secondary | ICD-10-CM

## 2015-09-22 DIAGNOSIS — S79919A Unspecified injury of unspecified hip, initial encounter: Secondary | ICD-10-CM | POA: Insufficient documentation

## 2015-09-22 HISTORY — PX: ANTERIOR APPROACH HEMI HIP ARTHROPLASTY: SHX6690

## 2015-09-22 LAB — BASIC METABOLIC PANEL
ANION GAP: 10 (ref 5–15)
BUN: 15 mg/dL (ref 6–20)
CO2: 26 mmol/L (ref 22–32)
Calcium: 8.9 mg/dL (ref 8.9–10.3)
Chloride: 104 mmol/L (ref 101–111)
Creatinine, Ser: 1.01 mg/dL — ABNORMAL HIGH (ref 0.44–1.00)
GFR calc non Af Amer: 52 mL/min — ABNORMAL LOW (ref >60)
GLUCOSE: 138 mg/dL — AB (ref 65–99)
POTASSIUM: 3.5 mmol/L (ref 3.5–5.1)
Sodium: 140 mmol/L (ref 135–145)

## 2015-09-22 LAB — HEPATIC FUNCTION PANEL
ALBUMIN: 3.5 g/dL (ref 3.5–5.0)
ALK PHOS: 40 U/L (ref 38–126)
ALT: 16 U/L (ref 14–54)
AST: 31 U/L (ref 15–41)
BILIRUBIN DIRECT: 0.1 mg/dL (ref 0.1–0.5)
BILIRUBIN TOTAL: 0.8 mg/dL (ref 0.3–1.2)
Indirect Bilirubin: 0.7 mg/dL (ref 0.3–0.9)
Total Protein: 6 g/dL — ABNORMAL LOW (ref 6.5–8.1)

## 2015-09-22 LAB — URINALYSIS, ROUTINE W REFLEX MICROSCOPIC
Bilirubin Urine: NEGATIVE
GLUCOSE, UA: NEGATIVE mg/dL
Hgb urine dipstick: NEGATIVE
Ketones, ur: NEGATIVE mg/dL
LEUKOCYTES UA: NEGATIVE
NITRITE: NEGATIVE
PH: 5.5 (ref 5.0–8.0)
PROTEIN: NEGATIVE mg/dL
Specific Gravity, Urine: 1.021 (ref 1.005–1.030)

## 2015-09-22 LAB — ABO/RH: ABO/RH(D): A POS

## 2015-09-22 LAB — CBC
HEMATOCRIT: 37.8 % (ref 36.0–46.0)
HEMOGLOBIN: 12.2 g/dL (ref 12.0–15.0)
MCH: 28.8 pg (ref 26.0–34.0)
MCHC: 32.3 g/dL (ref 30.0–36.0)
MCV: 89.4 fL (ref 78.0–100.0)
Platelets: 271 10*3/uL (ref 150–400)
RBC: 4.23 MIL/uL (ref 3.87–5.11)
RDW: 14.6 % (ref 11.5–15.5)
WBC: 10.4 10*3/uL (ref 4.0–10.5)

## 2015-09-22 LAB — SURGICAL PCR SCREEN
MRSA, PCR: NEGATIVE
STAPHYLOCOCCUS AUREUS: NEGATIVE

## 2015-09-22 SURGERY — HEMIARTHROPLASTY, HIP, DIRECT ANTERIOR APPROACH, FOR FRACTURE
Anesthesia: General | Laterality: Right

## 2015-09-22 MED ORDER — PROPOFOL 10 MG/ML IV BOLUS
INTRAVENOUS | Status: DC | PRN
  Administered 2015-09-22: 150 mg via INTRAVENOUS

## 2015-09-22 MED ORDER — CEFAZOLIN SODIUM-DEXTROSE 2-3 GM-% IV SOLR
2.0000 g | Freq: Four times a day (QID) | INTRAVENOUS | Status: AC
  Administered 2015-09-22 (×2): 2 g via INTRAVENOUS
  Filled 2015-09-22 (×2): qty 50

## 2015-09-22 MED ORDER — OXYCODONE HCL 5 MG PO TABS
5.0000 mg | ORAL_TABLET | ORAL | Status: DC | PRN
Start: 2015-09-22 — End: 2015-09-22

## 2015-09-22 MED ORDER — BUPIVACAINE HCL 0.5 % IJ SOLN
INTRAMUSCULAR | Status: DC | PRN
  Administered 2015-09-22: 20 mL

## 2015-09-22 MED ORDER — PHENYLEPHRINE HCL 10 MG/ML IJ SOLN
INTRAMUSCULAR | Status: DC | PRN
  Administered 2015-09-22 (×3): 80 ug via INTRAVENOUS

## 2015-09-22 MED ORDER — MIDAZOLAM HCL 5 MG/5ML IJ SOLN
INTRAMUSCULAR | Status: DC | PRN
  Administered 2015-09-22: 1 mg via INTRAVENOUS

## 2015-09-22 MED ORDER — 0.9 % SODIUM CHLORIDE (POUR BTL) OPTIME
TOPICAL | Status: DC | PRN
  Administered 2015-09-22: 400 mL

## 2015-09-22 MED ORDER — DOCUSATE SODIUM 100 MG PO CAPS
100.0000 mg | ORAL_CAPSULE | Freq: Two times a day (BID) | ORAL | Status: DC
  Administered 2015-09-22 – 2015-09-24 (×4): 100 mg via ORAL
  Filled 2015-09-22 (×4): qty 1

## 2015-09-22 MED ORDER — ONDANSETRON HCL 4 MG/2ML IJ SOLN: 4.0000 mg | Freq: Once | INTRAMUSCULAR | Status: DC

## 2015-09-22 MED ORDER — HYDROCODONE-ACETAMINOPHEN 5-325 MG PO TABS
1.0000 | ORAL_TABLET | ORAL | Status: DC | PRN
  Administered 2015-09-23 – 2015-09-24 (×6): 2 via ORAL
  Filled 2015-09-22 (×6): qty 2

## 2015-09-22 MED ORDER — FENTANYL CITRATE (PF) 250 MCG/5ML IJ SOLN
INTRAMUSCULAR | Status: AC
  Filled 2015-09-22: qty 5

## 2015-09-22 MED ORDER — KETOROLAC TROMETHAMINE 30 MG/ML IJ SOLN
15.0000 mg | Freq: Three times a day (TID) | INTRAMUSCULAR | Status: DC
  Administered 2015-09-22 – 2015-09-24 (×6): 15 mg via INTRAVENOUS
  Filled 2015-09-22 (×6): qty 1

## 2015-09-22 MED ORDER — ONDANSETRON HCL 4 MG/2ML IJ SOLN: 4.0000 mg | Freq: Four times a day (QID) | INTRAMUSCULAR | Status: DC | PRN

## 2015-09-22 MED ORDER — FENTANYL CITRATE (PF) 100 MCG/2ML IJ SOLN
INTRAMUSCULAR | Status: DC | PRN
  Administered 2015-09-22: 50 ug via INTRAVENOUS
  Administered 2015-09-22: 100 ug via INTRAVENOUS
  Administered 2015-09-22 (×2): 50 ug via INTRAVENOUS

## 2015-09-22 MED ORDER — MAGNESIUM HYDROXIDE 400 MG/5ML PO SUSP: 30.0000 mL | Freq: Every day | ORAL | Status: DC | PRN

## 2015-09-22 MED ORDER — PROPOFOL 10 MG/ML IV BOLUS
INTRAVENOUS | Status: AC
  Filled 2015-09-22: qty 20

## 2015-09-22 MED ORDER — ROCURONIUM BROMIDE 100 MG/10ML IV SOLN
INTRAVENOUS | Status: DC | PRN
  Administered 2015-09-22: 40 mg via INTRAVENOUS

## 2015-09-22 MED ORDER — METHOCARBAMOL 1000 MG/10ML IJ SOLN
500.0000 mg | Freq: Four times a day (QID) | INTRAVENOUS | Status: DC | PRN
  Administered 2015-09-22: 500 mg via INTRAVENOUS
  Filled 2015-09-22 (×2): qty 5

## 2015-09-22 MED ORDER — FENTANYL CITRATE (PF) 100 MCG/2ML IJ SOLN
INTRAMUSCULAR | Status: AC
  Filled 2015-09-22: qty 2

## 2015-09-22 MED ORDER — HYDROMORPHONE HCL 1 MG/ML IJ SOLN: 0.5000 mg | INTRAMUSCULAR | Status: DC | PRN

## 2015-09-22 MED ORDER — CEFAZOLIN SODIUM-DEXTROSE 2-3 GM-% IV SOLR
INTRAVENOUS | Status: AC
  Filled 2015-09-22: qty 50

## 2015-09-22 MED ORDER — ONDANSETRON HCL 4 MG/2ML IJ SOLN
INTRAMUSCULAR | Status: DC | PRN
  Administered 2015-09-22: 4 mg via INTRAVENOUS

## 2015-09-22 MED ORDER — METHOCARBAMOL 500 MG PO TABS
500.0000 mg | ORAL_TABLET | Freq: Four times a day (QID) | ORAL | Status: DC | PRN
  Filled 2015-09-22: qty 1

## 2015-09-22 MED ORDER — SUGAMMADEX SODIUM 200 MG/2ML IV SOLN
INTRAVENOUS | Status: DC | PRN
  Administered 2015-09-22: 150 mg via INTRAVENOUS

## 2015-09-22 MED ORDER — CEFAZOLIN SODIUM-DEXTROSE 2-3 GM-% IV SOLR
INTRAVENOUS | Status: DC | PRN
  Administered 2015-09-22: 2 g via INTRAVENOUS

## 2015-09-22 MED ORDER — SODIUM CHLORIDE 0.9 % IJ SOLN
INTRAMUSCULAR | Status: AC
  Filled 2015-09-22: qty 10

## 2015-09-22 MED ORDER — TRANEXAMIC ACID 1000 MG/10ML IV SOLN
1000.0000 mg | INTRAVENOUS | Status: AC
  Administered 2015-09-22: 1000 mg via INTRAVENOUS
  Filled 2015-09-22: qty 10

## 2015-09-22 MED ORDER — ALUM & MAG HYDROXIDE-SIMETH 200-200-20 MG/5ML PO SUSP: 30.0000 mL | ORAL | Status: DC | PRN

## 2015-09-22 MED ORDER — ACETAMINOPHEN 325 MG PO TABS: 650.0000 mg | ORAL_TABLET | Freq: Four times a day (QID) | ORAL | Status: DC | PRN

## 2015-09-22 MED ORDER — EPHEDRINE SULFATE 50 MG/ML IJ SOLN
INTRAMUSCULAR | Status: AC
  Filled 2015-09-22: qty 1

## 2015-09-22 MED ORDER — BUPIVACAINE HCL (PF) 0.5 % IJ SOLN
INTRAMUSCULAR | Status: AC
  Filled 2015-09-22: qty 10

## 2015-09-22 MED ORDER — MIDAZOLAM HCL 2 MG/2ML IJ SOLN
INTRAMUSCULAR | Status: AC
  Filled 2015-09-22: qty 2

## 2015-09-22 MED ORDER — PROMETHAZINE HCL 25 MG/ML IJ SOLN: 6.2500 mg | INTRAMUSCULAR | Status: DC | PRN

## 2015-09-22 MED ORDER — ENOXAPARIN SODIUM 40 MG/0.4ML ~~LOC~~ SOLN: 40.0000 mg | SUBCUTANEOUS | Status: DC

## 2015-09-22 MED ORDER — SUGAMMADEX SODIUM 200 MG/2ML IV SOLN
INTRAVENOUS | Status: AC
  Filled 2015-09-22: qty 2

## 2015-09-22 MED ORDER — ONDANSETRON HCL 4 MG/2ML IJ SOLN
INTRAMUSCULAR | Status: AC
  Filled 2015-09-22: qty 2

## 2015-09-22 MED ORDER — IRBESARTAN 150 MG PO TABS
150.0000 mg | ORAL_TABLET | Freq: Every day | ORAL | Status: DC
  Administered 2015-09-23 – 2015-09-24 (×2): 150 mg via ORAL
  Filled 2015-09-22 (×2): qty 1

## 2015-09-22 MED ORDER — FERROUS SULFATE 325 (65 FE) MG PO TABS
325.0000 mg | ORAL_TABLET | Freq: Two times a day (BID) | ORAL | Status: DC
  Administered 2015-09-23 – 2015-09-24 (×3): 325 mg via ORAL
  Filled 2015-09-22 (×3): qty 1

## 2015-09-22 MED ORDER — POLYETHYLENE GLYCOL 3350 17 G PO PACK: 17.0000 g | PACK | Freq: Every day | ORAL | Status: DC | PRN

## 2015-09-22 MED ORDER — KETOROLAC TROMETHAMINE 30 MG/ML IJ SOLN
INTRAMUSCULAR | Status: AC
  Filled 2015-09-22: qty 1

## 2015-09-22 MED ORDER — SUCCINYLCHOLINE CHLORIDE 20 MG/ML IJ SOLN
INTRAMUSCULAR | Status: DC | PRN
  Administered 2015-09-22: 100 mg via INTRAVENOUS

## 2015-09-22 MED ORDER — ROCURONIUM BROMIDE 50 MG/5ML IV SOLN
INTRAVENOUS | Status: AC
  Filled 2015-09-22: qty 1

## 2015-09-22 MED ORDER — FENTANYL CITRATE (PF) 100 MCG/2ML IJ SOLN
25.0000 ug | INTRAMUSCULAR | Status: DC | PRN
  Administered 2015-09-22: 50 ug via INTRAVENOUS
  Administered 2015-09-22 (×2): 25 ug via INTRAVENOUS

## 2015-09-22 MED ORDER — LIDOCAINE HCL (CARDIAC) 20 MG/ML IV SOLN
INTRAVENOUS | Status: DC | PRN
  Administered 2015-09-22: 70 mg via INTRAVENOUS

## 2015-09-22 MED ORDER — ACETAMINOPHEN 650 MG RE SUPP: 650.0000 mg | Freq: Four times a day (QID) | RECTAL | Status: DC | PRN

## 2015-09-22 MED ORDER — PHENYLEPHRINE HCL 10 MG/ML IJ SOLN
10.0000 mg | INTRAVENOUS | Status: DC | PRN
  Administered 2015-09-22: 25 ug/min via INTRAVENOUS

## 2015-09-22 MED ORDER — LACTATED RINGERS IV SOLN
INTRAVENOUS | Status: DC | PRN
  Administered 2015-09-22 (×2): via INTRAVENOUS

## 2015-09-22 MED ORDER — TRANEXAMIC ACID 1000 MG/10ML IV SOLN
1000.0000 mg | Freq: Once | INTRAVENOUS | Status: AC
  Administered 2015-09-22: 1000 mg via INTRAVENOUS
  Filled 2015-09-22: qty 10

## 2015-09-22 MED ORDER — LIDOCAINE HCL (CARDIAC) 20 MG/ML IV SOLN
INTRAVENOUS | Status: AC
  Filled 2015-09-22: qty 5

## 2015-09-22 MED ORDER — PANTOPRAZOLE SODIUM 40 MG PO TBEC
40.0000 mg | DELAYED_RELEASE_TABLET | Freq: Every day | ORAL | Status: DC
  Administered 2015-09-23 – 2015-09-24 (×2): 40 mg via ORAL
  Filled 2015-09-22 (×2): qty 1

## 2015-09-22 MED ORDER — BUPIVACAINE-EPINEPHRINE (PF) 0.5% -1:200000 IJ SOLN
INTRAMUSCULAR | Status: AC
  Filled 2015-09-22: qty 30

## 2015-09-22 MED ORDER — CLOPIDOGREL BISULFATE 75 MG PO TABS
75.0000 mg | ORAL_TABLET | Freq: Every day | ORAL | Status: DC
  Administered 2015-09-22 – 2015-09-24 (×3): 75 mg via ORAL
  Filled 2015-09-22 (×3): qty 1

## 2015-09-22 MED ORDER — SUCCINYLCHOLINE CHLORIDE 20 MG/ML IJ SOLN
INTRAMUSCULAR | Status: AC
  Filled 2015-09-22: qty 1

## 2015-09-22 MED ORDER — BUPIVACAINE HCL (PF) 0.5 % IJ SOLN
INTRAMUSCULAR | Status: AC
  Filled 2015-09-22: qty 30

## 2015-09-22 MED ORDER — BUPIVACAINE LIPOSOME 1.3 % IJ SUSP
20.0000 mL | INTRAMUSCULAR | Status: AC
  Administered 2015-09-22: 20 mL
  Filled 2015-09-22: qty 20

## 2015-09-22 MED ORDER — POTASSIUM CHLORIDE IN NACL 20-0.9 MEQ/L-% IV SOLN
INTRAVENOUS | Status: DC
  Administered 2015-09-22 – 2015-09-23 (×3): via INTRAVENOUS
  Filled 2015-09-22 (×3): qty 1000

## 2015-09-22 SURGICAL SUPPLY — 47 items
BNDG COHESIVE 6X5 TAN STRL LF (GAUZE/BANDAGES/DRESSINGS) ×3 IMPLANT
CAPT HIP HEMI 1 ×3 IMPLANT
CELLS DAT CNTRL 66122 CELL SVR (MISCELLANEOUS) ×1 IMPLANT
COVER SURGICAL LIGHT HANDLE (MISCELLANEOUS) ×3 IMPLANT
DRAPE C-ARM 42X72 X-RAY (DRAPES) ×3 IMPLANT
DRAPE IMP U-DRAPE 54X76 (DRAPES) ×3 IMPLANT
DRAPE STERI IOBAN 125X83 (DRAPES) ×3 IMPLANT
DRAPE U-SHAPE 47X51 STRL (DRAPES) ×9 IMPLANT
DRSG AQUACEL AG ADV 3.5X10 (GAUZE/BANDAGES/DRESSINGS) ×3 IMPLANT
DRSG MEPILEX BORDER 4X8 (GAUZE/BANDAGES/DRESSINGS) IMPLANT
DURAPREP 26ML APPLICATOR (WOUND CARE) ×3 IMPLANT
ELECT BLADE 4.0 EZ CLEAN MEGAD (MISCELLANEOUS) ×3
ELECT REM PT RETURN 9FT ADLT (ELECTROSURGICAL) ×3
ELECTRODE BLDE 4.0 EZ CLN MEGD (MISCELLANEOUS) ×1 IMPLANT
ELECTRODE REM PT RTRN 9FT ADLT (ELECTROSURGICAL) ×1 IMPLANT
FACESHIELD WRAPAROUND (MASK) ×3 IMPLANT
GLOVE SKINSENSE NS SZ7.5 (GLOVE) ×4
GLOVE SKINSENSE STRL SZ7.5 (GLOVE) ×2 IMPLANT
GLOVE SURG SYN 7.5  E (GLOVE) ×2
GLOVE SURG SYN 7.5 E (GLOVE) ×1 IMPLANT
GOWN SRG XL XLNG 56XLVL 4 (GOWN DISPOSABLE) ×1 IMPLANT
GOWN STRL NON-REIN XL XLG LVL4 (GOWN DISPOSABLE) ×2
GOWN STRL REUS W/ TWL LRG LVL3 (GOWN DISPOSABLE) IMPLANT
GOWN STRL REUS W/TWL LRG LVL3 (GOWN DISPOSABLE)
HANDPIECE INTERPULSE COAX TIP (DISPOSABLE) ×2
KIT BASIN OR (CUSTOM PROCEDURE TRAY) ×3 IMPLANT
MARKER SKIN DUAL TIP RULER LAB (MISCELLANEOUS) IMPLANT
PACK TOTAL JOINT (CUSTOM PROCEDURE TRAY) ×3 IMPLANT
PACK UNIVERSAL I (CUSTOM PROCEDURE TRAY) IMPLANT
PADDING CAST COTTON 6X4 STRL (CAST SUPPLIES) IMPLANT
RTRCTR WOUND ALEXIS 18CM MED (MISCELLANEOUS) ×3
SAW OSC TIP CART 19.5X105X1.3 (SAW) ×3 IMPLANT
SEALER BIPOLAR AQUA 6.0 (INSTRUMENTS) IMPLANT
SET HNDPC FAN SPRY TIP SCT (DISPOSABLE) ×1 IMPLANT
SOLUTION BETADINE 4OZ (MISCELLANEOUS) ×3 IMPLANT
SUT ETHIBOND 2 V 37 (SUTURE) ×3 IMPLANT
SUT ETHIBOND NAB CT1 #1 30IN (SUTURE) ×3 IMPLANT
SUT ETHILON 3 0 FSL (SUTURE) IMPLANT
SUT VIC AB 0 CT1 27 (SUTURE) ×2
SUT VIC AB 0 CT1 27XBRD ANBCTR (SUTURE) ×1 IMPLANT
SUT VIC AB 1 CT1 27 (SUTURE) ×2
SUT VIC AB 1 CT1 27XBRD ANBCTR (SUTURE) ×1 IMPLANT
SUT VIC AB 2-0 CT1 27 (SUTURE) ×2
SUT VIC AB 2-0 CT1 TAPERPNT 27 (SUTURE) ×1 IMPLANT
SYR 20CC LL (SYRINGE) IMPLANT
TOWEL OR 17X26 10 PK STRL BLUE (TOWEL DISPOSABLE) ×3 IMPLANT
TRAY FOLEY CATH 16FR SILVER (SET/KITS/TRAYS/PACK) ×3 IMPLANT

## 2015-09-22 NOTE — Brief Op Note (Signed)
09/21/2015 - 09/22/2015  9:07 AM  PATIENT:  Lindsey Barnett  81 y.o. female  PRE-OPERATIVE DIAGNOSIS:  RIGHT FEMORAL NECK FRACTURE  POST-OPERATIVE DIAGNOSIS:  RIGHT FEMORAL NECK FRACTURE  PROCEDURE:  Procedure(s): ANTERIOR APPROACH HEMI HIP ARTHROPLASTY (Left)  SURGEON:  Surgeon(s) and Role:    * Dorna Leitz, MD - Primary  PHYSICIAN ASSISTANT:   ASSISTANTS: bethune   ANESTHESIA:   general  EBL:  Total I/O In: 1000 [I.V.:1000] Out: -   BLOOD ADMINISTERED:none  DRAINS: none   LOCAL MEDICATIONS USED:  OTHER experel  SPECIMEN:  No Specimen  DISPOSITION OF SPECIMEN:  N/A  COUNTS:  YES  TOURNIQUET:  * No tourniquets in log *  DICTATION: .Other Dictation: Dictation Number [id number]  PLAN OF CARE: Admit to inpatient   PATIENT DISPOSITION:  PACU - hemodynamically stable.   Delay start of Pharmacological VTE agent (>24hrs) due to surgical blood loss or risk of bleeding: no

## 2015-09-22 NOTE — Transfer of Care (Signed)
Immediate Anesthesia Transfer of Care Note  Patient: Lindsey Barnett  Procedure(s) Performed: Procedure(s): ANTERIOR APPROACH HEMI HIP ARTHROPLASTY (Right)  Patient Location: PACU  Anesthesia Type:General  Level of Consciousness: awake, alert , oriented and patient cooperative  Airway & Oxygen Therapy: Patient Spontanous Breathing and Patient connected to nasal cannula oxygen  Post-op Assessment: Report given to RN, Post -op Vital signs reviewed and stable and Patient moving all extremities  Post vital signs: Reviewed and stable  Last Vitals:  Filed Vitals:   09/22/15 0038 09/22/15 0524  BP: 149/57 117/54  Pulse: 55 65  Temp: 36.6 C 36.6 C  Resp: 16 16    Complications: No apparent anesthesia complications

## 2015-09-22 NOTE — Anesthesia Postprocedure Evaluation (Signed)
Anesthesia Post Note  Patient: Lindsey Barnett  Procedure(s) Performed: Procedure(s) (LRB): ANTERIOR APPROACH HEMI HIP ARTHROPLASTY (Right)  Anesthesia Type: General Level of consciousness: awake Pain management: pain level controlled Vital Signs Assessment: post-procedure vital signs reviewed and stable Respiratory status: spontaneous breathing Cardiovascular status: stable Anesthetic complications: no    Last Vitals:  Filed Vitals:   09/22/15 1034 09/22/15 1116  BP: 115/55 130/48  Pulse: 54 59  Temp: 36.1 C 36.4 C  Resp: 14 16    Last Pain:  Filed Vitals:   09/22/15 1116  PainSc: 2                  EDWARDS,Sylver Vantassell

## 2015-09-22 NOTE — Anesthesia Preprocedure Evaluation (Signed)
Anesthesia Evaluation  Patient identified by MRN, date of birth, ID band Patient awake    Reviewed: Allergy & Precautions, NPO status   Airway Mallampati: II  TM Distance: >3 FB Neck ROM: Full    Dental   Pulmonary neg pulmonary ROS,    breath sounds clear to auscultation       Cardiovascular hypertension,  Rhythm:Regular Rate:Normal     Neuro/Psych    GI/Hepatic Neg liver ROS, GERD  ,  Endo/Other  negative endocrine ROS  Renal/GU negative Renal ROS     Musculoskeletal   Abdominal   Peds  Hematology   Anesthesia Other Findings   Reproductive/Obstetrics                             Anesthesia Physical Anesthesia Plan  ASA: III  Anesthesia Plan: General   Post-op Pain Management:    Induction: Intravenous  Airway Management Planned: Oral ETT  Additional Equipment:   Intra-op Plan:   Post-operative Plan: Possible Post-op intubation/ventilation  Informed Consent: I have reviewed the patients History and Physical, chart, labs and discussed the procedure including the risks, benefits and alternatives for the proposed anesthesia with the patient or authorized representative who has indicated his/her understanding and acceptance.   Dental advisory given  Plan Discussed with: CRNA and Anesthesiologist  Anesthesia Plan Comments:         Anesthesia Quick Evaluation

## 2015-09-22 NOTE — Progress Notes (Signed)
Admitted after midnight. Chart reviewed. Patient examined. Doing well postop.  Doree Barthel, MD [facility] [email or web address] password [facility]

## 2015-09-22 NOTE — H&P (Signed)
[facility] History and Physical  PENINA SODER [id number] DOB: [birth year] DOA: 09/21/2015  PCP: Henrine Screws, MD   Chief Complaint: Right hip pain  HPI: Lindsey Barnett is a 81 y.o. woman with a history of HTN, HLD, TIA in 2005, and GERD who feels that she was in her baseline state of health until she had an accidental fall in her home this evening, while trying to clean out a linen closet.  She immediately developed right hip pain.  She denies head trauma or loss of consciousness.  No preceding chest pain, pressure, or tightness.  No palpitations. No headache.  She had a procedure on Wednesday for varicose veins with [facility].  She has not been on antibiotics in the past 30 days.  She has a history of UTI, but she denies lower urinary tract symptoms at this time.  No unexplained fatigue, shortness of breath, chest pain, or fluid retention.  ED evaluation shows acute subcapital femoral neck fracture involving the proximal right femur.  Hospitalist asked to admit.  Orthopedic surgery consult pending.  Patient is NPO; anticipating surgery in the morning.  Review of Systems: 12 systems reviewed and negative except as stated in HPI.  Past Medical History  Diagnosis Date  . Hypertension   . Hyperlipidemia   . History of TIA (transient ischemic attack) 2005--  NO RESIDUAL  . GERD (gastroesophageal reflux disease) OCCASIONAL  . Acute meniscal tear, lateral RIGHT  . Swelling of right knee joint   . Cataract immature   . Arthritis HIP   Past Surgical History  Procedure Laterality Date  . Cholecystectomy  1975  . Vaginal hysterectomy  1973  . Knee arthroscopy  09/16/2011    Procedure: ARTHROSCOPY KNEE;  Surgeon: Gearlean Alf, MD;  [facility];  Service: Orthopedics;  Laterality: Right;  WITH lateral meniscal debridement   Social History:  Social History   Social History Narrative  She has never smoked.  She will have an occasional glass  of wine.  No illicit drug use.  She is married.  She has three children, one deceased.  Allergies  Allergen Reactions  . Codeine Other (See Comments)    STOMACHE SPASMS  . Contrast Media [Iodinated Diagnostic Agents] Hives  . Iodine Other (See Comments)    Hives?  . Sodium Tetradecyl Sulfate Other (See Comments)    Family History: Both parents had heart disease.  One brother had Crohn's disease.  Prior to Admission medications   Medication Sig Start Date End Date Taking? Authorizing Provider  Cholecalciferol 2000 units CAPS Take 2,000 Units by mouth daily.   Yes Historical Provider, MD  clopidogrel (PLAVIX) 75 MG tablet Take 75 mg by mouth daily.   Yes Historical Provider, MD  Coenzyme Q10 (COQ10) 200 MG CAPS Take 200 mg by mouth daily.    Yes Historical Provider, MD  estradiol (ESTRACE) 0.5 MG tablet Take 0.5 mg by mouth daily.   Yes Historical Provider, MD  HYDROcodone-acetaminophen (NORCO/VICODIN) 5-325 MG per tablet Take 1-2 tablets by mouth every 4 (four) hours as needed for moderate pain or severe pain. 01/14/15  Yes Clayton Bibles, PA-C  Multiple Vitamin (MULTIVITAMIN) tablet Take 1 tablet by mouth at bedtime.    Yes Historical Provider, MD  pantoprazole (PROTONIX) 40 MG tablet Take 40 mg by mouth daily. 08/28/15  Yes Historical Provider, MD  valsartan (DIOVAN) 160 MG tablet Take 160 mg by mouth daily.   Yes Historical Provider, MD   Physical Exam:  Filed Vitals:   09/21/15 2036 09/21/15 2145 09/21/15 2300 09/21/15 2330  BP: 123/61 120/61 131/62 127/60  Pulse: 58 55 64 56  Temp: 97.5 F (36.4 C)     TempSrc: Oral     Resp: 18 12 14 13   Height: 5\' 7"  (1.702 m)     Weight: 77.111 kg (170 lb)     SpO2:  99% 100% 99%     General:  Awake and alert.  Oriented x4.  In pain but she is hemodynamically stable.  Eyes: PERRL bilaterally  ENT: Mucous membranes are dry.  Neck: Supple.   Cardiovascular: NR/RR.  No LE edema.  Palpable distal pulses bilaterally.  Respiratory: CTA  bilaterally listening anteriorly.  Abdomen: Soft/NT/ND.  Bowel sounds are present.  No guarding.  Skin: Warm and dry.  Musculoskeletal: Pain with movement in right leg.  Psychiatric: Normal affect.  Neurologic: No focal deficits.   Labs on Admission:  Basic Metabolic Panel:  Recent Labs Lab 09/21/15 2210  NA 142  K 3.4*  CL 104  CO2 26  GLUCOSE 136*  BUN 18  CREATININE 0.88  CALCIUM 9.3   CBC:  Recent Labs Lab 09/21/15 2210  WBC 12.7*  NEUTROABS 10.5*  HGB 12.4  HCT 37.7  MCV 88.9  PLT 269   Radiological Exams on Admission: Dg Hip Unilat With Pelvis 2-3 Views Right  09/21/2015  CLINICAL DATA:  Fall. EXAM: DG HIP (WITH OR WITHOUT PELVIS) 2-3V RIGHT COMPARISON:  None. FINDINGS: There is an acute subcapital femoral neck fracture identified involving the right proximal femur. No dislocations identified. IMPRESSION: 1. Acute subcapital femoral neck fracture involves the proximal right femur. Electronically Signed   By: Kerby Moors M.D.   On: 09/21/2015 21:32    EKG: PENDING  Assessment/Plan Principal Problem:   Subcapital fracture of hip (HCC) Active Problems:   Hypokalemia   HTN (hypertension)   GERD (gastroesophageal reflux disease)   Hip fracture requiring operative repair ([place])  Admit to telemetry (the patient has had intermittent bradycardia in the ED)  Right femoral neck fracture --NPO now.  Pre-op chest xray, EKG, U/A, hepatic function panel pending. --Orthopedic consult called from the ED --Patient appears to be low risk for perioperative cardiac events.  She understands that she must have surgery to have any chance of regaining her prior level of function.  She is willing to proceed. --Analgesics and anti-emetics as needed --SubQ lovenox ordered to start TOMORROW (after surgery) for DVT prophylaxis.  SCDs also ordered. --Defer PT/OT orders until after surgery  Mild hypokalemia noted --NS with 51mEq KCl at 75cc/hr ordered now.  Repeat BMP at  5AM.  Can likely D/C fluids on call to the OR.  HTN --Continue ARB  GERD --Continue PPI  History of TIA but no CVA --Plavix ON HOLD until after surgery  Code Status: FULL Family Communication: Patient's son Rosalita Chessman at bedside tonight Disposition Plan: Expect her to be here at least two midnights, will likely need placement for rehab  Time spent: 55 minutes  [employer] [facility]  09/22/2015, 12:07 AM

## 2015-09-22 NOTE — Anesthesia Procedure Notes (Signed)
Procedure Name: Intubation Date/Time: 09/22/2015 7:49 AM Performed by: Rebekah Chesterfield L Pre-anesthesia Checklist: Patient identified, Emergency Drugs available, Suction available, Patient being monitored and Timeout performed Patient Re-evaluated:Patient Re-evaluated prior to inductionOxygen Delivery Method: Circle system utilized Preoxygenation: Pre-oxygenation with 100% oxygen Intubation Type: IV induction, Cricoid Pressure applied and Rapid sequence Laryngoscope Size: Mac and 3 Grade View: Grade II Tube type: Oral Tube size: 7.5 mm Number of attempts: 1 Airway Equipment and Method: Stylet Placement Confirmation: ETT inserted through vocal cords under direct vision,  positive ETCO2 and breath sounds checked- equal and bilateral Secured at: 21 cm Tube secured with: Tape Dental Injury: Teeth and Oropharynx as per pre-operative assessment

## 2015-09-22 NOTE — Discharge Instructions (Signed)

## 2015-09-23 ENCOUNTER — Encounter (HOSPITAL_COMMUNITY): Payer: Self-pay | Admitting: Orthopedic Surgery

## 2015-09-23 DIAGNOSIS — I10 Essential (primary) hypertension: Secondary | ICD-10-CM

## 2015-09-23 LAB — CBC
HCT: 31.9 % — ABNORMAL LOW (ref 36.0–46.0)
HEMOGLOBIN: 10.5 g/dL — AB (ref 12.0–15.0)
MCH: 29.8 pg (ref 26.0–34.0)
MCHC: 32.9 g/dL (ref 30.0–36.0)
MCV: 90.6 fL (ref 78.0–100.0)
Platelets: 202 10*3/uL (ref 150–400)
RBC: 3.52 MIL/uL — ABNORMAL LOW (ref 3.87–5.11)
RDW: 15 % (ref 11.5–15.5)
WBC: 7.9 10*3/uL (ref 4.0–10.5)

## 2015-09-23 LAB — BASIC METABOLIC PANEL
ANION GAP: 6 (ref 5–15)
BUN: 7 mg/dL (ref 6–20)
CALCIUM: 7.9 mg/dL — AB (ref 8.9–10.3)
CO2: 25 mmol/L (ref 22–32)
Chloride: 109 mmol/L (ref 101–111)
Creatinine, Ser: 0.86 mg/dL (ref 0.44–1.00)
GLUCOSE: 108 mg/dL — AB (ref 65–99)
Potassium: 3.5 mmol/L (ref 3.5–5.1)
Sodium: 140 mmol/L (ref 135–145)

## 2015-09-23 MED ORDER — PNEUMOCOCCAL VAC POLYVALENT 25 MCG/0.5ML IJ INJ: 0.5000 mL | INJECTION | INTRAMUSCULAR | Status: DC

## 2015-09-23 NOTE — Evaluation (Signed)
Occupational Therapy Evaluation and Discharge Patient Details Name: Lindsey Barnett MRN: [id number] DOB: 20-Jun-1935 Today's Date: 09/23/2015    History of Present Illness 81 yo admitted after fall off stool at home with right hip hemiarthroplasty direct anterior approach. PMHx. HTN, GERD   Clinical Impression   This 81 yo female admitted with above presents to acute OT with all education completed with pt and her husband. No further OT needs, we will sign off.    Follow Up Recommendations  No OT follow up    Equipment Recommendations  3 in 1 bedside comode       Precautions / Restrictions Precautions Precautions: None Restrictions Weight Bearing Restrictions: No RLE Weight Bearing: Weight bearing as tolerated      Mobility Bed Mobility Overal bed mobility: Needs Assistance Bed Mobility: Supine to Sit     Supine to sit: Min assist     General bed mobility comments: only needed A for RLE  Transfers Overall transfer level: Needs assistance Equipment used: Rolling walker (2 wheeled) Transfers: Sit to/from Stand Sit to Stand: Supervision                   ADL Overall ADL's : Needs assistance/impaired Eating/Feeding: Independent;Sitting   Grooming: Supervision/safety;Standing   Upper Body Bathing: Set up;Sitting   Lower Body Bathing: Minimal assistance (S sit<>stand)   Upper Body Dressing : Set up;Sitting   Lower Body Dressing: Minimal assistance (S sit<>stand)   Toilet Transfer: Ambulation;RW;BSC   Toileting- Clothing Manipulation and Hygiene: Supervision/safety;Sit to/from stand   Tub/ Shower Transfer: Walk-in shower;Supervision/safety;Ambulation;Rolling walker;3 in 1   Functional mobility during ADLs: Supervision/safety  I discussed with pt and husband the most efficient way to get dressed.                 Pertinent Vitals/Pain Pain Assessment: 0-10 Pain Score: 2  Pain Location: right thigh Pain Descriptors / Indicators: Sore Pain  Intervention(s): Monitored during session;Repositioned     Hand Dominance Right   Extremity/Trunk Assessment Upper Extremity Assessment Upper Extremity Assessment: Overall WFL for tasks assessed           Communication Communication Communication: No difficulties   Cognition Arousal/Alertness: Awake/alert Behavior During Therapy: WFL for tasks assessed/performed Overall Cognitive Status: Within Functional Limits for tasks assessed                                Home Living Family/patient expects to be discharged to:: Private residence Living Arrangements: Spouse/significant other Available Help at Discharge: Family;Available 24 hours/day Type of Home: House Home Access: Stairs to enter [employer] of Steps: 1   Home Layout: Two level;Bed/bath upstairs     Bathroom Shower/Tub: Walk-in shower;Door   Bathroom Toilet: Handicapped height     Home Equipment: Hand held shower head          Prior Functioning/Environment Level of Independence: Independent             OT Diagnosis: Generalized weakness;Acute pain         OT Goals(Current goals can be found in the care plan section) Acute Rehab OT Goals Patient Stated Goal: home probably tomorrow  OT Frequency:                End of Session Equipment Utilized During Treatment: Gait belt;Rolling walker  Activity Tolerance: Patient tolerated treatment well Patient left: in chair;with call bell/phone within reach   Time: [id number] OT Time Calculation (  min): 27 min Charges:  OT General Charges $OT Visit: 1 Procedure OT Evaluation $OT Eval Moderate Complexity: 1 Procedure OT Treatments $Self Care/Home Management : 8-22 mins     Almon Register [id number] 09/23/2015, 3:22 PM

## 2015-09-23 NOTE — Op Note (Signed)
NAMEKERSEY, CZYZEWSKI                 ACCOUNT NO.:  [id number]  MEDICAL RECORD NO.:  [id number]  LOCATION:  [facility]                         FACILITY:  [facility]  PHYSICIAN:  Alta Corning, M.D.   DATE OF BIRTH:  1935/01/08  DATE OF PROCEDURE:  09/22/2015 DATE OF DISCHARGE:                              OPERATIVE REPORT   PREOPERATIVE DIAGNOSIS:  Displaced femoral neck fracture, right.  POSTOPERATIVE DIAGNOSIS:  Displaced femoral neck fracture, right.  PROCEDURE: 1. Right hemiarthroplasty from an anterior approach with a Corail stem size 12, a 45-mm -3 neck ball monopolar.   2. Interpretation of multiple intraoperative fluoroscopic images.  SURGEON:  Alta Corning, M.D.  ASSISTANT:  Gary Fleet, P.A.  ANESTHESIA:  General.  BRIEF HISTORY:  Ms. Nabarro is a 81 year old female with a history of falling off a step stool.  She suffered a right femoral neck fracture, displaced.  She was admitted to the hospital and medically cleared, and once this was done, she was taken to the operating room for hemiarthroplasty.  Because of her activity level and relatively young age, we felt that anterior approach was appropriate and this was chosen to be used.  DESCRIPTION OF PROCEDURE:  The patient was brought to the operating room.  After adequate anesthesia was obtained with general anesthetic, the patient was placed supine on the operating table.  The right hip was prepped and draped in usual sterile fashion after being placed on the Hana bed.  Once this was done, an incision was made for an anterior approach to the hip, subcutaneous tissues down the level of the tensor fascia.  Tensor fascia was identified and divided.  Allis clamp was put in place.  The muscles were retracted off the fascia and retractors put in place around the femoral neck.  The vessels were cauterized and the capsule was opened and tagged.  The anterior capsule was completely taken down.  Once this was completed, attention  was turned towards provisional neck cut which was made and this segment of the neck was removed.  The head was then removed with a corkscrew and the acetabulum was inspected and noted to be normal.  We irrigated thoroughly, removed all the bone fragments that were around.  At this point, the hip was externally rotated, and the arm for the Hana bed was put in place, and the femur put it down and over position.  I sequentially rasped up to a level of 12, 12 was a really good fit calcar plane.  I put a 36 ball and then checked leg lengths, it was a little bit long at that point, so we took the 12 down a little bit further, did the match on a 45 ball and came to a -3 neck ball which really got [place] the appropriate offset and was about 3-4 mm long but I think it is about as good as we could do without wanting to drive the stem down any further.  At this point, the trials were removed and the final size 12 Corail stem was put in place. A 45-mm head ball with a -3 was placed and this was reduced.  Leg lengths were checked  again.  She was probably 2-3 mm long but actually looked quite good.  I irrigated thoroughly, closed the capsule and closed the tensor fascia with 0 Vicryl running, skin with 2-0 Vicryl, and then skin staples.  Sterile compressive dressing was applied, and the patient was taken to recovery where she was noted to be in satisfactory condition.  Estimated blood loss for the procedure was 200 mL.  Multiple intraoperative fluoroscopic images were taken to assess adequacy of stem size and leg length.     Alta Corning, M.D.     Corliss Skains  D:  09/22/2015  T:  09/22/2015  Job:  [id number]

## 2015-09-23 NOTE — Progress Notes (Signed)
Orthopedic Tech Progress Note Patient Details:  LAKINYA HUNER 1935-03-18 [id number]  Patient ID: Jessica Priest, female   DOB: 08/14/1934, 81 y.o.   MRN: [id number]   [place] Pink 09/23/2015, 1:26 PMTrapeze bar

## 2015-09-23 NOTE — Progress Notes (Signed)
[facility] PROGRESS NOTE  Lindsey Barnett [id number] DOB: 1935-01-15 DOA: 09/21/2015 PCP: Henrine Screws, MD  Assessment/Plan:  Principal Problem:   Subcapital fracture of hip ([place]) Active Problems:   Hypokalemia   HTN (hypertension)   GERD (gastroesophageal reflux disease) Expected postop anemia  Doing well postop. Monitor h/h. Eventually, home with PT  HPI/Subjective: Pain controlled. Walked with PT  Objective: Filed Vitals:   09/23/15 0143 09/23/15 0532  BP: 112/55 113/50  Pulse: 81 78  Temp: 98.9 F (37.2 C) 99 F (37.2 C)  Resp: 16 16    Intake/Output Summary (Last 24 hours) at 09/23/15 1401 Last data filed at 09/23/15 0900  Gross per 24 hour  Intake 2842.5 ml  Output    300 ml  Net 2542.5 ml   Filed Weights   09/21/15 2036 09/22/15 0038  Weight: 77.111 kg (170 lb) 77.7 kg (171 lb 4.8 oz)    Exam:   General:  A and o, comfortable  Cardiovascular: RRR without MGR  Respiratory: CTA without WRR  Abdomen: S, NT, ND  Ext: no CCE. Dressing in place  Basic Metabolic Panel:  Recent Labs Lab 09/21/15 2210 09/22/15 0428 09/23/15 0557  NA 142 140 140  K 3.4* 3.5 3.5  CL 104 104 109  CO2 26 26 25   GLUCOSE 136* 138* 108*  BUN 18 15 7   CREATININE 0.88 1.01* 0.86  CALCIUM 9.3 8.9 7.9*   Liver Function Tests:  Recent Labs Lab 09/22/15 0159  AST 31  ALT 16  ALKPHOS 40  BILITOT 0.8  PROT 6.0*  ALBUMIN 3.5   No results for input(s): LIPASE, AMYLASE in the last 168 hours. No results for input(s): AMMONIA in the last 168 hours. CBC:  Recent Labs Lab 09/21/15 2210 09/22/15 0428 09/23/15 0557  WBC 12.7* 10.4 7.9  NEUTROABS 10.5*  --   --   HGB 12.4 12.2 10.5*  HCT 37.7 37.8 31.9*  MCV 88.9 89.4 90.6  PLT 269 271 202   Cardiac Enzymes: No results for input(s): CKTOTAL, CKMB, CKMBINDEX, TROPONINI in the last 168 hours. BNP (last 3 results) No results for input(s): BNP in the last 8760 hours.  ProBNP (last 3  results) No results for input(s): PROBNP in the last 8760 hours.  CBG: No results for input(s): GLUCAP in the last 168 hours.  Recent Results (from the past 240 hour(s))  Surgical pcr screen     Status: None   Collection Time: 09/22/15  6:42 AM  Result Value Ref Range Status   MRSA, PCR NEGATIVE NEGATIVE Final   Staphylococcus aureus NEGATIVE NEGATIVE Final    Comment:        The Xpert SA Assay (FDA approved for NASAL specimens in patients over 41 years of age), is one component of a comprehensive surveillance program.  Test performance has been validated by [facility] for patients greater than or equal to 23 year old. It is not intended to diagnose infection nor to guide or monitor treatment.      Studies: Dg Chest Port 1 View  09/22/2015  CLINICAL DATA:  Preoperative chest radiograph.  Initial encounter. EXAM: PORTABLE CHEST 1 VIEW COMPARISON:  Chest radiograph performed 08/30/2013 FINDINGS: The lungs are well-aerated and clear. There is no evidence of focal opacification, pleural effusion or pneumothorax. The cardiomediastinal silhouette is borderline normal in size. No acute osseous abnormalities are seen. IMPRESSION: No acute cardiopulmonary process seen. Electronically Signed   By: Garald Balding M.D.   On: 09/22/2015 06:31  Dg Hip Operative Unilat With Pelvis Right  09/22/2015  CLINICAL DATA:  RIGHT hip replacement, RIGHT femoral neck fracture EXAM: OPERATIVE RIGHT HIP (WITH PELVIS IF PERFORMED)  VIEWS TECHNIQUE: Fluoroscopic spot image(s) were submitted for interpretation post-operatively. FLUOROSCOPY TIME:  0 minutes 6 seconds Images obtained:  2 COMPARISON:  Preoperative images 3 10/2015 FINDINGS: Interval resection of RIGHT femoral head and placement of a RIGHT hip prosthesis. No acute fracture or dislocation. Bones appear demineralized. No acute complication seen. IMPRESSION: RIGHT hip prosthesis without acute complication. Electronically Signed   By: Lavonia Dana M.D.    On: 09/22/2015 09:12   Dg Hip Unilat With Pelvis 2-3 Views Right  09/22/2015  CLINICAL DATA:  Right hip arthroplasty EXAM: DG HIP (WITH OR WITHOUT PELVIS) 2-3V RIGHT COMPARISON:  09/21/2015 FINDINGS: Interval right hip arthroplasty, components project in expected location. No fracture or dislocation. IMPRESSION: Right hip arthroplasty without apparent complication. Electronically Signed   By: Lucrezia Europe M.D.   On: 09/22/2015 10:40   Dg Hip Unilat With Pelvis 2-3 Views Right  09/21/2015  CLINICAL DATA:  Fall. EXAM: DG HIP (WITH OR WITHOUT PELVIS) 2-3V RIGHT COMPARISON:  None. FINDINGS: There is an acute subcapital femoral neck fracture identified involving the right proximal femur. No dislocations identified. IMPRESSION: 1. Acute subcapital femoral neck fracture involves the proximal right femur. Electronically Signed   By: Kerby Moors M.D.   On: 09/21/2015 21:32    Scheduled Meds: . clopidogrel  75 mg Oral Daily  . docusate sodium  100 mg Oral BID  . ferrous sulfate  325 mg Oral BID WC  . irbesartan  150 mg Oral Daily  . ketorolac  15 mg Intravenous 3 times per day  . ondansetron (ZOFRAN) IV  4 mg Intravenous Once  . pantoprazole  40 mg Oral Daily  . [START ON 09/24/2015] pneumococcal 23 valent vaccine  0.5 mL Intramuscular Tomorrow-1000   Continuous Infusions: . 0.9 % NaCl with KCl 20 mEq / L 75 mL/hr at 09/23/15 0427    Time spent: 15 minutes  [facility] [email or web address], password [facility] 09/23/2015, 2:01 PM  LOS: 2 days

## 2015-09-23 NOTE — Clinical Social Work Note (Signed)
CSW received referral for SNF.  Case discussed with case manager, and plan is to discharge home.  CSW to sign off please re-consult if social work needs arise.  Mcgwire Dasaro R. Rodrickus Min, MSW, LCSWA [phone]  

## 2015-09-23 NOTE — Evaluation (Signed)
Physical Therapy Evaluation Patient Details Name: Lindsey Barnett MRN: [id number] DOB: 23-Mar-1935 Today's Date: 09/23/2015   History of Present Illness  81 yo admitted after fall off stool at home with right hip hemiarthroplasty direct anterior approach. PMHx. HTN, GERD  Clinical Impression  Pt very pleasant and eager to move. Performing transfers and gait with limited assist. Pt with decreased ROM, strength, transfers and gait who will benefit from acute therapy to maximize mobility, function and gait to return home. Recommend continued mobility with nursing and will attempt stairs next session for return home.     Follow Up Recommendations Home health PT    Equipment Recommendations  3in1 (PT);Rolling walker with 5" wheels    Recommendations for Other Services       Precautions / Restrictions Restrictions Weight Bearing Restrictions: Yes RLE Weight Bearing: Weight bearing as tolerated      Mobility  Bed Mobility Overal bed mobility: Needs Assistance Bed Mobility: Supine to Sit     Supine to sit: Min assist     General bed mobility comments: assist for moving RLE to EOB with cues  Transfers Overall transfer level: Needs assistance   Transfers: Sit to/from Stand Sit to Stand: Supervision         General transfer comment: cues for hand placement and RLE positioning to ease hip flexion  Ambulation/Gait Ambulation/Gait assistance: Supervision Ambulation Distance (Feet): 150 Feet Assistive device: Rolling walker (2 wheeled) Gait Pattern/deviations: Step-through pattern;Decreased stride length   Gait velocity interpretation: Below normal speed for age/gender General Gait Details: cues for position in RW, pt denied attempting stairs at this time  Stairs            Wheelchair Mobility    Modified Rankin (Stroke Patients Only)       Balance                                             Pertinent Vitals/Pain Pain Assessment: 0-10 Pain  Score: 2  Pain Location: right thigh Pain Descriptors / Indicators: Sore Pain Intervention(s): Limited activity within patient's tolerance;Monitored during session;Repositioned;Patient requesting pain meds-RN notified    Home Living Family/patient expects to be discharged to:: Private residence Living Arrangements: Spouse/significant other Available Help at Discharge: Family;Available 24 hours/day Type of Home: House Home Access: Stairs to enter   [employer] of Steps: 1 Home Layout: Two level;Bed/bath upstairs Home Equipment: None      Prior Function Level of Independence: Independent               Hand Dominance        Extremity/Trunk Assessment   Upper Extremity Assessment: Overall WFL for tasks assessed           Lower Extremity Assessment: RLE deficits/detail RLE Deficits / Details: decreased ROM and strength secondary to post op pain    Cervical / Trunk Assessment: Normal  Communication   Communication: No difficulties  Cognition Arousal/Alertness: Awake/alert Behavior During Therapy: WFL for tasks assessed/performed Overall Cognitive Status: Within Functional Limits for tasks assessed                      General Comments      Exercises        Assessment/Plan    PT Assessment Patient needs continued PT services  PT Diagnosis Difficulty walking;Acute pain   PT Problem List Decreased strength;Decreased  activity tolerance;Decreased mobility;Pain;Decreased knowledge of use of DME  PT Treatment Interventions DME instruction;Gait training;Stair training;Functional mobility training;Therapeutic activities;Therapeutic exercise;Patient/family education   PT Goals (Current goals can be found in the Care Plan section) Acute Rehab PT Goals Patient Stated Goal: return home PT Goal Formulation: With patient Time For Goal Achievement: 09/30/15 Potential to Achieve Goals: Good    Frequency Min 6X/week   Barriers to discharge         Co-evaluation               End of Session Equipment Utilized During Treatment: Gait belt Activity Tolerance: Patient tolerated treatment well Patient left: in chair;with call bell/phone within reach;with chair alarm set Nurse Communication: Mobility status         Time: [id number] PT Time Calculation (min) (ACUTE ONLY): 25 min   Charges:   PT Evaluation $PT Eval Moderate Complexity: 1 Procedure PT Treatments $Gait Training: 8-22 mins   PT G CodesMelford Aase 09/23/2015, 9:24 AM Elwyn Reach, [place]

## 2015-09-23 NOTE — Progress Notes (Signed)
Subjective: 1 Day Post-Op Procedure(s) (LRB): ANTERIOR APPROACH HEMI HIP ARTHROPLASTY (Right) Patient reports pain as mild. Did Very well with physical therapy. Taking by mouth and voiding okay.  Objective: Vital signs in last 24 hours: Temp:  [98.2 F (36.8 C)-99 F (37.2 C)] 98.2 F (36.8 C) (03/06 1431) Pulse Rate:  [72-81] 72 (03/06 1431) Resp:  [16] 16 (03/06 1431) BP: (112-124)/(50-56) 124/55 mmHg (03/06 1431) SpO2:  [95 %-99 %] 95 % (03/06 1431)  Intake/Output from previous day: 03/05 0701 - 03/06 0700 In: 2240 [P.O.:240; I.V.:2000] Out: 600 [Urine:400; Blood:200] Intake/Output this shift: Total I/O In: 2842.5 [P.O.:480; I.V.:2362.5] Out: -    Recent Labs  09/21/15 2210 09/22/15 0428 09/23/15 0557  HGB 12.4 12.2 10.5*    Recent Labs  09/22/15 0428 09/23/15 0557  WBC 10.4 7.9  RBC 4.23 3.52*  HCT 37.8 31.9*  PLT 271 202    Recent Labs  09/22/15 0428 09/23/15 0557  NA 140 140  K 3.5 3.5  CL 104 109  CO2 26 25  BUN 15 7  CREATININE 1.01* 0.86  GLUCOSE 138* 108*  CALCIUM 8.9 7.9*    Recent Labs  09/21/15 2210  INR 0.96  Right hip exam:  Neurovascular intact Sensation intact distally Intact pulses distally Dorsiflexion/Plantar flexion intact Incision: dressing C/D/I Compartment soft  Assessment/Plan: 1 Day Post-Op Procedure(s) (LRB): ANTERIOR APPROACH HEMI HIP ARTHROPLASTY (Right) Plan: Aspirin 325 mg twice daily with SCDs for DVT prophylaxis. Up with therapy Plan for discharge tomorrow Discharge home with home health tomorrow  Erlene Senters 09/23/2015, 3:02 PM

## 2015-09-23 NOTE — Progress Notes (Signed)
Utilization review completed.  

## 2015-09-23 NOTE — Care Management Note (Addendum)
Case Management Note  Patient Details  Name: NYASHIA HIRSCHY MRN: [id number] Date of Birth: October 09, 1934  Subjective/Objective:         Admitted with fracture of right femur, s/p right hip hemi-arthroplasty           Action/Plan: Spoke with patient and her husband about discharge plan. They will review the list of home health agencies and decide which one they will work with. Will follow up with them on 09/24/15 to set up home health. Contacted [facility] with [facility] and requested rolling walker and 3N1 be delivered to patient's room.   09/24/15 Patient chose [facility], contacted [facility] with [facility] and set up [place]. Rolling walker and 3N1 have been delivered to patient's room.    Expected Discharge Date:                  Expected Discharge Plan:  [place]  In-House Referral:  NA  Discharge planning Services  CM Consult  Post Acute Care Choice:  Durable Medical Equipment, Home Health Choice offered to:  Patient  DME Arranged:  3-N-1, Walker rolling DME Agency:  [place]:  PT [facility] Agency:     Status of Service:  In process, will continue to follow  Medicare Important Message Given:    Date Medicare IM Given:    Medicare IM give by:    Date Additional Medicare IM Given:    Additional Medicare Important Message give by:     If discussed at [place] of Stay Meetings, dates discussed:    Additional Comments:  Nila Nephew, RN 09/23/2015, 3:03 PM

## 2015-09-24 DIAGNOSIS — S72011A Unspecified intracapsular fracture of right femur, initial encounter for closed fracture: Principal | ICD-10-CM

## 2015-09-24 LAB — CBC
HCT: 30.4 % — ABNORMAL LOW (ref 36.0–46.0)
Hemoglobin: 10.1 g/dL — ABNORMAL LOW (ref 12.0–15.0)
MCH: 30.1 pg (ref 26.0–34.0)
MCHC: 33.2 g/dL (ref 30.0–36.0)
MCV: 90.5 fL (ref 78.0–100.0)
PLATELETS: 192 10*3/uL (ref 150–400)
RBC: 3.36 MIL/uL — AB (ref 3.87–5.11)
RDW: 15.1 % (ref 11.5–15.5)
WBC: 7.9 10*3/uL (ref 4.0–10.5)

## 2015-09-24 MED ORDER — HYDROCODONE-ACETAMINOPHEN 5-325 MG PO TABS: 1.0000 | ORAL_TABLET | Freq: Four times a day (QID) | ORAL | Status: DC | PRN

## 2015-09-24 MED ORDER — POLYETHYLENE GLYCOL 3350 17 G PO PACK: 17.0000 g | PACK | Freq: Every day | ORAL | Status: DC

## 2015-09-24 NOTE — Progress Notes (Signed)
Subjective: 2 Days Post-Op Procedure(s) (LRB): ANTERIOR APPROACH HEMI HIP ARTHROPLASTY (Right) Patient reports pain as mild.  She is doing well. She is taking by mouth and voiding okay. Ready to go home. She was on Plavix and aspirin preoperatively.  Objective: Vital signs in last 24 hours: Temp:  [98 F (36.7 C)-98.8 F (37.1 C)] 98 F (36.7 C) (03/07 0500) Pulse Rate:  [70-79] 79 (03/07 0500) Resp:  [16-17] 17 (03/07 0500) BP: (112-124)/(54-63) 112/63 mmHg (03/07 0500) SpO2:  [95 %] 95 % (03/07 0500)  Intake/Output from previous day: 03/06 0701 - 03/07 0700 In: 2842.5 [P.O.:480; I.V.:2362.5] Out: -  Intake/Output this shift:     Recent Labs  09/21/15 2210 09/22/15 0428 09/23/15 0557 09/24/15 0540  HGB 12.4 12.2 10.5* 10.1*    Recent Labs  09/23/15 0557 09/24/15 0540  WBC 7.9 7.9  RBC 3.52* 3.36*  HCT 31.9* 30.4*  PLT 202 192    Recent Labs  09/22/15 0428 09/23/15 0557  NA 140 140  K 3.5 3.5  CL 104 109  CO2 26 25  BUN 15 7  CREATININE 1.01* 0.86  GLUCOSE 138* 108*  CALCIUM 8.9 7.9*    Recent Labs  09/21/15 2210  INR 0.96   Right hip exam: Neurovascular intact Sensation intact distally Intact pulses distally Dorsiflexion/Plantar flexion intact Incision: dressing C/D/I Compartment soft  Assessment/Plan: 2 Days Post-Op Procedure(s) (LRB): ANTERIOR APPROACH HEMI HIP ARTHROPLASTY (Right) Plan: Plavix and aspirin for DVT prophylaxis. She was on this preoperatively. Weight-bear as tolerated on right. Rx for Norco 5 mg as needed for pain. Discharge home with home health Follow-up with Dr. Berenice Primas in 2 weeks.  Makaila Windle G 09/24/2015, 9:39 AM

## 2015-09-24 NOTE — Progress Notes (Signed)
Pt ready for d/c home per MD. Necessary equipment has been delivered to pt's room, cleared by PT/OT. Discharge teaching and prescriptions reviewed with pt and her husband, they denied any questions. Belongings gathered and will be sent with pt.   [place], Jerry Caras

## 2015-09-24 NOTE — Progress Notes (Signed)
Physical Therapy Treatment Patient Details Name: Lindsey Barnett MRN: [id number] DOB: 1935-03-09 Today's Date: 09/24/2015    History of Present Illness 81 yo admitted after fall off stool at home with right hip hemiarthroplasty direct anterior approach. PMHx. HTN, GERD    PT Comments    Patient is making good progress with PT.  Stair training complete. From a mobility standpoint anticipate patient will be ready for DC home when medically ready.     Follow Up Recommendations  Home health PT     Equipment Recommendations  3in1 (PT);Rolling walker with 5" wheels    Recommendations for Other Services       Precautions / Restrictions Precautions Precautions: None Restrictions Weight Bearing Restrictions: No RLE Weight Bearing: Weight bearing as tolerated    Mobility  Bed Mobility Overal bed mobility: Needs Assistance Bed Mobility: Supine to Sit     Supine to sit: Supervision     General bed mobility comments: cues for technique; HOB flat and no use of bedrails  Transfers Overall transfer level: Needs assistance Equipment used: Rolling walker (2 wheeled) Transfers: Sit to/from Stand Sit to Stand: Supervision         General transfer comment: cues for safe hand placement with some carry over noted from previous session; supervision for safety  Ambulation/Gait Ambulation/Gait assistance: Supervision Ambulation Distance (Feet): 250 Feet Assistive device: Rolling walker (2 wheeled) Gait Pattern/deviations: Step-through pattern;Decreased stride length     General Gait Details: cues for position of RW   Stairs Stairs: Yes Stairs assistance: Min guard Stair Management: No rails;Backwards;With walker;One rail Right;Sideways Number of Stairs: 12 (10 sideways; 2 backwards) General stair comments: educated on technique and sequencing; pt with good safety awareness and no unsteadiness  Wheelchair Mobility    Modified Rankin (Stroke Patients Only)       Balance  Overall balance assessment: Needs assistance Sitting-balance support: No upper extremity supported;Feet supported Sitting balance-Leahy Scale: Good     Standing balance support: No upper extremity supported Standing balance-Leahy Scale: Fair                      Cognition Arousal/Alertness: Awake/alert Behavior During Therapy: WFL for tasks assessed/performed Overall Cognitive Status: Within Functional Limits for tasks assessed                      Exercises General Exercises - Lower Extremity Quad Sets: AROM;Both;10 reps;Supine Heel Slides: AROM;Right;10 reps;Supine Hip ABduction/ADduction: AROM;Right;10 reps;Supine    General Comments General comments (skin integrity, edema, etc.): reviewed HEP and given handout      Pertinent Vitals/Pain Pain Assessment: No/denies pain Pain Location: R thigh Pain Descriptors / Indicators: Tightness Pain Intervention(s): Monitored during session;Premedicated before session;Repositioned    Home Living                      Prior Function            PT Goals (current goals can now be found in the care plan section) Acute Rehab PT Goals Patient Stated Goal: go home Progress towards PT goals: Progressing toward goals    Frequency  Min 6X/week    PT Plan Current plan remains appropriate    Co-evaluation             End of Session Equipment Utilized During Treatment: Gait belt Activity Tolerance: Patient tolerated treatment well Patient left: in chair;with call bell/phone within reach;with chair alarm set     Time: [phone]  PT Time Calculation (min) (ACUTE ONLY): 31 min  Charges:  $Gait Training: 8-22 mins $Therapeutic Exercise: 8-22 mins                    G Codes:      [place] April, PTA Pager: [phone]   09/24/2015, 9:26 AM

## 2015-09-24 NOTE — Discharge Summary (Signed)
Physician Discharge Summary  Lindsey Barnett [id number] DOB: 1935/03/06 DOA: 09/21/2015  PCP: Henrine Screws, MD  Admit date: 09/21/2015 Discharge date: 09/24/2015  Time spent: greater than 30 minutes  Recommendations for Outpatient Follow-up:  1. Home with PT   Discharge Diagnoses:  Principal Problem:   Subcapital fracture of hip (HCC) Active Problems:   Hypokalemia   HTN (hypertension)   GERD (gastroesophageal reflux disease) Postoperative anemia   Discharge Condition: stable  Diet recommendation: heart healthy  Filed Weights   09/21/15 2036 09/22/15 0038  Weight: 77.111 kg (170 lb) 77.7 kg (171 lb 4.8 oz)    History of present illness:  81 y.o. woman with a history of HTN, HLD, TIA in 2005, and GERD who feels that she was in her baseline state of health until she had an accidental fall in her home this evening, while trying to clean out a linen closet. She immediately developed right hip pain. She denies head trauma or loss of consciousness. No preceding chest pain, pressure, or tightness. No palpitations. No headache. She had a procedure on Wednesday for varicose veins with [facility].   Hospital Course:  Admitted to hospitalists. Orthopedics consulted. Had uneventful right hemiarthroplasty. Did well with PT, able to ambulate with PT and cleared by orthopedics for discharge home with home PT  Procedures:  Right hemiarthroplasty   Consultations:  Orthopedics, Dr. Berenice Primas  Discharge Exam: Filed Vitals:   09/23/15 2042 09/24/15 0500  BP: 112/54 112/63  Pulse: 70 79  Temp: 98.8 F (37.1 C) 98 F (36.7 C)  Resp: 17 17    General: a and o Cardiovascular: RRR Respiratory: CTA abd S, NT, ND Dressing CDI. No edema  Discharge Instructions   Discharge Instructions    Increase activity slowly    Complete by:  As directed           Current Discharge Medication List    START taking these medications   Details  polyethylene glycol  (MIRALAX / GLYCOLAX) packet Take 17 g by mouth daily. Qty: 14 each, Refills: 0      CONTINUE these medications which have CHANGED   Details  HYDROcodone-acetaminophen (NORCO/VICODIN) 5-325 MG tablet Take 1-2 tablets by mouth every 6 (six) hours as needed for severe pain. Qty: 50 tablet, Refills: 0      CONTINUE these medications which have NOT CHANGED   Details  Cholecalciferol 2000 units CAPS Take 2,000 Units by mouth daily.    clopidogrel (PLAVIX) 75 MG tablet Take 75 mg by mouth daily.    Coenzyme Q10 (COQ10) 200 MG CAPS Take 200 mg by mouth daily.     estradiol (ESTRACE) 0.5 MG tablet Take 0.5 mg by mouth daily.    Multiple Vitamin (MULTIVITAMIN) tablet Take 1 tablet by mouth at bedtime.     pantoprazole (PROTONIX) 40 MG tablet Take 40 mg by mouth daily.    valsartan (DIOVAN) 160 MG tablet Take 160 mg by mouth daily.       Allergies  Allergen Reactions  . Codeine Other (See Comments)    STOMACHE SPASMS  . Contrast Media [Iodinated Diagnostic Agents] Hives  . Iodine Other (See Comments)    Hives?  . Sodium Tetradecyl Sulfate Other (See Comments)   Follow-up Information    Follow up with GRAVES,JOHN L, MD. Schedule an appointment as soon as possible for a visit in 2 weeks.   Specialty:  Orthopedic Surgery   Contact information:   [place] [address] [phone]  Follow up with [facility].   Why:  They will contact you to shcedule home therapy visits.   Contact information:   [place] [address] [phone]        The results of significant diagnostics from this hospitalization (including imaging, microbiology, ancillary and laboratory) are listed below for reference.    Significant Diagnostic Studies: Dg Chest Port 1 View  09/22/2015  CLINICAL DATA:  Preoperative chest radiograph.  Initial encounter. EXAM: PORTABLE CHEST 1 VIEW COMPARISON:  Chest radiograph performed 08/30/2013 FINDINGS: The lungs  are well-aerated and clear. There is no evidence of focal opacification, pleural effusion or pneumothorax. The cardiomediastinal silhouette is borderline normal in size. No acute osseous abnormalities are seen. IMPRESSION: No acute cardiopulmonary process seen. Electronically Signed   By: Garald Balding M.D.   On: 09/22/2015 06:31   Dg Hip Operative Unilat With Pelvis Right  09/22/2015  CLINICAL DATA:  RIGHT hip replacement, RIGHT femoral neck fracture EXAM: OPERATIVE RIGHT HIP (WITH PELVIS IF PERFORMED)  VIEWS TECHNIQUE: Fluoroscopic spot image(s) were submitted for interpretation post-operatively. FLUOROSCOPY TIME:  0 minutes 6 seconds Images obtained:  2 COMPARISON:  Preoperative images 3 10/2015 FINDINGS: Interval resection of RIGHT femoral head and placement of a RIGHT hip prosthesis. No acute fracture or dislocation. Bones appear demineralized. No acute complication seen. IMPRESSION: RIGHT hip prosthesis without acute complication. Electronically Signed   By: Lavonia Dana M.D.   On: 09/22/2015 09:12   Dg Hip Unilat With Pelvis 2-3 Views Right  09/22/2015  CLINICAL DATA:  Right hip arthroplasty EXAM: DG HIP (WITH OR WITHOUT PELVIS) 2-3V RIGHT COMPARISON:  09/21/2015 FINDINGS: Interval right hip arthroplasty, components project in expected location. No fracture or dislocation. IMPRESSION: Right hip arthroplasty without apparent complication. Electronically Signed   By: Lucrezia Europe M.D.   On: 09/22/2015 10:40   Dg Hip Unilat With Pelvis 2-3 Views Right  09/21/2015  CLINICAL DATA:  Fall. EXAM: DG HIP (WITH OR WITHOUT PELVIS) 2-3V RIGHT COMPARISON:  None. FINDINGS: There is an acute subcapital femoral neck fracture identified involving the right proximal femur. No dislocations identified. IMPRESSION: 1. Acute subcapital femoral neck fracture involves the proximal right femur. Electronically Signed   By: Kerby Moors M.D.   On: 09/21/2015 21:32    Microbiology: Recent Results (from the past 240 hour(s))   Surgical pcr screen     Status: None   Collection Time: 09/22/15  6:42 AM  Result Value Ref Range Status   MRSA, PCR NEGATIVE NEGATIVE Final   Staphylococcus aureus NEGATIVE NEGATIVE Final    Comment:        The Xpert SA Assay (FDA approved for NASAL specimens in patients over 38 years of age), is one component of a comprehensive surveillance program.  Test performance has been validated by [facility] for patients greater than or equal to 39 year old. It is not intended to diagnose infection nor to guide or monitor treatment.      Labs: Basic Metabolic Panel:  Recent Labs Lab 09/21/15 2210 09/22/15 0428 09/23/15 0557  NA 142 140 140  K 3.4* 3.5 3.5  CL 104 104 109  CO2 26 26 25   GLUCOSE 136* 138* 108*  BUN 18 15 7   CREATININE 0.88 1.01* 0.86  CALCIUM 9.3 8.9 7.9*   Liver Function Tests:  Recent Labs Lab 09/22/15 0159  AST 31  ALT 16  ALKPHOS 40  BILITOT 0.8  PROT 6.0*  ALBUMIN 3.5   No results  for input(s): LIPASE, AMYLASE in the last 168 hours. No results for input(s): AMMONIA in the last 168 hours. CBC:  Recent Labs Lab 09/21/15 2210 09/22/15 0428 09/23/15 0557 09/24/15 0540  WBC 12.7* 10.4 7.9 7.9  NEUTROABS 10.5*  --   --   --   HGB 12.4 12.2 10.5* 10.1*  HCT 37.7 37.8 31.9* 30.4*  MCV 88.9 89.4 90.6 90.5  PLT 269 271 202 192   Cardiac Enzymes: No results for input(s): CKTOTAL, CKMB, CKMBINDEX, TROPONINI in the last 168 hours. BNP: BNP (last 3 results) No results for input(s): BNP in the last 8760 hours.  ProBNP (last 3 results) No results for input(s): PROBNP in the last 8760 hours.  CBG: No results for input(s): GLUCAP in the last 168 hours.     Signed:  Delfina Redwood MD [facility] 09/24/2015, 10:54 AM

## 2015-09-29 ENCOUNTER — Ambulatory Visit (HOSPITAL_COMMUNITY)
Admission: RE | Admit: 2015-09-29 | Discharge: 2015-09-29 | Disposition: A | Discharge status: Home / Self Care | Payer: Medicare Other | Source: Ambulatory Visit | Attending: Physician Assistant | Admitting: Physician Assistant

## 2015-09-29 ENCOUNTER — Other Ambulatory Visit: Payer: Self-pay | Admitting: Physician Assistant

## 2015-09-29 DIAGNOSIS — M79604 Pain in right leg: Secondary | ICD-10-CM

## 2015-09-29 DIAGNOSIS — M79661 Pain in right lower leg: Secondary | ICD-10-CM

## 2015-09-29 DIAGNOSIS — S72001S Fracture of unspecified part of neck of right femur, sequela: Secondary | ICD-10-CM

## 2015-09-29 DIAGNOSIS — I83891 Varicose veins of right lower extremities with other complications: Secondary | ICD-10-CM | POA: Insufficient documentation

## 2015-09-29 DIAGNOSIS — M7989 Other specified soft tissue disorders: Secondary | ICD-10-CM

## 2015-09-29 NOTE — Progress Notes (Addendum)
*  Preliminary Results* Right lower extremity venous duplex completed. Right lower extremity is negative for deep vein thrombosis. 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 There is no evidence of right Baker's cyst.   Preliminary results discussed with Kirstin Shepperson.  09/29/2015 1:23 PM  Maudry Mayhew, RVT, RDCS, RDMS

## 2015-11-06 ENCOUNTER — Other Ambulatory Visit: Payer: Self-pay | Admitting: Orthopedic Surgery

## 2015-11-06 DIAGNOSIS — M25551 Pain in right hip: Secondary | ICD-10-CM

## 2015-11-08 ENCOUNTER — Ambulatory Visit
Admission: RE | Admit: 2015-11-08 | Discharge: 2015-11-08 | Disposition: A | Discharge status: Home / Self Care | Payer: Medicare Other | Source: Ambulatory Visit | Attending: Orthopedic Surgery | Admitting: Orthopedic Surgery

## 2015-11-08 DIAGNOSIS — M25551 Pain in right hip: Secondary | ICD-10-CM

## 2016-02-17 ENCOUNTER — Other Ambulatory Visit: Payer: Self-pay | Admitting: Internal Medicine

## 2016-02-17 DIAGNOSIS — Z1231 Encounter for screening mammogram for malignant neoplasm of breast: Secondary | ICD-10-CM

## 2016-02-20 ENCOUNTER — Other Ambulatory Visit: Payer: Self-pay | Admitting: Physician Assistant

## 2016-02-25 NOTE — Patient Instructions (Addendum)
DUCHESS MARCUM  02/25/2016   Your procedure is scheduled on: 03/06/2016   Report to [facility] Main  Entrance take [place]  elevators to 3rd floor to  [place] at  [place] AM.  Call this number if you have problems the morning of surgery [phone]   Remember: ONLY 1 PERSON MAY GO WITH YOU TO SHORT STAY TO GET  READY MORNING OF YOUR SURGERY.  Do not eat food or drink liquids :After Midnight.     Take these medicines the morning of surgery with A SIP OF WATER: protonix                                 You may not have any metal on your body including hair pins and              piercings  Do not wear jewelry, make-up, lotions, powders or perfumes, deodorant             Do not wear nail polish.  Do not shave  48 hours prior to surgery.               Do not bring valuables to the hospital. [address].  Contacts, dentures or bridgework may not be worn into surgery.  Leave suitcase in the car. After surgery it may be brought to your room.        Special Instructions: coughing and deep breathing exercises, leg exercises               Please read over the following fact sheets you were given: _____________________________________________________________________             [facility] - Preparing for Surgery Before surgery, you can play an important role.  Because skin is not sterile, your skin needs to be as free of germs as possible.  You can reduce the number of germs on your skin by washing with CHG (chlorahexidine gluconate) soap before surgery.  CHG is an antiseptic cleaner which kills germs and bonds with the skin to continue killing germs even after washing. Please DO NOT use if you have an allergy to CHG or antibacterial soaps.  If your skin becomes reddened/irritated stop using the CHG and inform your nurse when you arrive at Short Stay. Do not shave (including legs and underarms) for at least 48 hours  prior to the first CHG shower.  You may shave your face/neck. Please follow these instructions carefully:  1.  Shower with CHG Soap the night before surgery and the  morning of Surgery.  2.  If you choose to wash your hair, wash your hair first as usual with your  normal  shampoo.  3.  After you shampoo, rinse your hair and body thoroughly to remove the  shampoo.                           4.  Use CHG as you would any other liquid soap.  You can apply chg directly  to the skin and wash                       Gently with a scrungie or  clean washcloth.  5.  Apply the CHG Soap to your body ONLY FROM THE NECK DOWN.   Do not use on face/ open                           Wound or open sores. Avoid contact with eyes, ears mouth and genitals (private parts).                       Wash face,  Genitals (private parts) with your normal soap.             6.  Wash thoroughly, paying special attention to the area where your surgery  will be performed.  7.  Thoroughly rinse your body with warm water from the neck down.  8.  DO NOT shower/wash with your normal soap after using and rinsing off  the CHG Soap.                9.  Pat yourself dry with a clean towel.            10.  Wear clean pajamas.            11.  Place clean sheets on your bed the night of your first shower and do not  sleep with pets. Day of Surgery : Do not apply any lotions/deodorants the morning of surgery.  Please wear clean clothes to the hospital/surgery center.  FAILURE TO FOLLOW THESE INSTRUCTIONS MAY RESULT IN THE CANCELLATION OF YOUR SURGERY PATIENT SIGNATURE_________________________________  NURSE SIGNATURE__________________________________  ________________________________________________________________________  WHAT IS A BLOOD TRANSFUSION? Blood Transfusion Information  A transfusion is the replacement of blood or some of its parts. Blood is made up of multiple cells which provide different functions.  Red blood cells carry  oxygen and are used for blood loss replacement.  White blood cells fight against infection.  Platelets control bleeding.  Plasma helps clot blood.  Other blood products are available for specialized needs, such as hemophilia or other clotting disorders. BEFORE THE TRANSFUSION  Who gives blood for transfusions?   Healthy volunteers who are fully evaluated to make sure their blood is safe. This is blood bank blood. Transfusion therapy is the safest it has ever been in the practice of medicine. Before blood is taken from a donor, a complete history is taken to make sure that person has no history of diseases nor engages in risky social behavior (examples are intravenous drug use or sexual activity with multiple partners). The donor's travel history is screened to minimize risk of transmitting infections, such as malaria. The donated blood is tested for signs of infectious diseases, such as HIV and hepatitis. The blood is then tested to be sure it is compatible with you in order to minimize the chance of a transfusion reaction. If you or a relative donates blood, this is often done in anticipation of surgery and is not appropriate for emergency situations. It takes many days to process the donated blood. RISKS AND COMPLICATIONS Although transfusion therapy is very safe and saves many lives, the main dangers of transfusion include:   Getting an infectious disease.  Developing a transfusion reaction. This is an allergic reaction to something in the blood you were given. Every precaution is taken to prevent this. The decision to have a blood transfusion has been considered carefully by your caregiver before blood is given. Blood is not given unless the benefits outweigh the risks. AFTER THE TRANSFUSION  Right after receiving a blood transfusion, you will usually feel much better and more energetic. This is especially true if your red blood cells have gotten low (anemic). The transfusion raises the  level of the red blood cells which carry oxygen, and this usually causes an energy increase.  The nurse administering the transfusion will monitor you carefully for complications. HOME CARE INSTRUCTIONS  No special instructions are needed after a transfusion. You may find your energy is better. Speak with your caregiver about any limitations on activity for underlying diseases you may have. SEEK MEDICAL CARE IF:   Your condition is not improving after your transfusion.  You develop redness or irritation at the intravenous (IV) site. SEEK IMMEDIATE MEDICAL CARE IF:  Any of the following symptoms occur over the next 12 hours:  Shaking chills.  You have a temperature by mouth above 102 F (38.9 C), not controlled by medicine.  Chest, back, or muscle pain.  People around you feel you are not acting correctly or are confused.  Shortness of breath or difficulty breathing.  Dizziness and fainting.  You get a rash or develop hives.  You have a decrease in urine output.  Your urine turns a dark color or changes to pink, red, or brown. Any of the following symptoms occur over the next 10 days:  You have a temperature by mouth above 102 F (38.9 C), not controlled by medicine.  Shortness of breath.  Weakness after normal activity.  The white part of the eye turns yellow (jaundice).  You have a decrease in the amount of urine or are urinating less often.  Your urine turns a dark color or changes to pink, red, or brown. Document Released: 07/03/2000 Document Revised: 09/28/2011 Document Reviewed: 02/20/2008 ExitCare Patient Information 2014 [place].  _______________________________________________________________________  Incentive Spirometer  An incentive spirometer is a tool that can help keep your lungs clear and active. This tool measures how well you are filling your lungs with each breath. Taking long deep breaths may help reverse or decrease the chance of  developing breathing (pulmonary) problems (especially infection) following:  A long period of time when you are unable to move or be active. BEFORE THE PROCEDURE   If the spirometer includes an indicator to show your best effort, your nurse or respiratory therapist will set it to a desired goal.  If possible, sit up straight or lean slightly forward. Try not to slouch.  Hold the incentive spirometer in an upright position. INSTRUCTIONS FOR USE  1. Sit on the edge of your bed if possible, or sit up as far as you can in bed or on a chair. 2. Hold the incentive spirometer in an upright position. 3. Breathe out normally. 4. Place the mouthpiece in your mouth and seal your lips tightly around it. 5. Breathe in slowly and as deeply as possible, raising the piston or the ball toward the top of the column. 6. Hold your breath for 3-5 seconds or for as long as possible. Allow the piston or ball to fall to the bottom of the column. 7. Remove the mouthpiece from your mouth and breathe out normally. 8. Rest for a few seconds and repeat Steps 1 through 7 at least 10 times every 1-2 hours when you are awake. Take your time and take a few normal breaths between deep breaths. 9. The spirometer may include an indicator to show your best effort. Use the indicator as a goal to work toward during each repetition. 10. After  each set of 10 deep breaths, practice coughing to be sure your lungs are clear. If you have an incision (the cut made at the time of surgery), support your incision when coughing by placing a pillow or rolled up towels firmly against it. Once you are able to get out of bed, walk around indoors and cough well. You may stop using the incentive spirometer when instructed by your caregiver.  RISKS AND COMPLICATIONS  Take your time so you do not get dizzy or light-headed.  If you are in pain, you may need to take or ask for pain medication before doing incentive spirometry. It is harder to take a  deep breath if you are having pain. AFTER USE  Rest and breathe slowly and easily.  It can be helpful to keep track of a log of your progress. Your caregiver can provide you with a simple table to help with this. If you are using the spirometer at home, follow these instructions: [place] IF:   You are having difficultly using the spirometer.  You have trouble using the spirometer as often as instructed.  Your pain medication is not giving enough relief while using the spirometer.  You develop fever of 100.5 F (38.1 C) or higher. SEEK IMMEDIATE MEDICAL CARE IF:   You cough up bloody sputum that had not been present before.  You develop fever of 102 F (38.9 C) or greater.  You develop worsening pain at or near the incision site. MAKE SURE YOU:   Understand these instructions.  Will watch your condition.  Will get help right away if you are not doing well or get worse. Document Released: 11/16/2006 Document Revised: 09/28/2011 Document Reviewed: 01/17/2007 [facility] Patient Information 2014 [place], [place].   ________________________________________________________________________

## 2016-02-27 ENCOUNTER — Ambulatory Visit
Admission: RE | Admit: 2016-02-27 | Discharge: 2016-02-27 | Disposition: A | Discharge status: Home / Self Care | Payer: Medicare Other | Source: Ambulatory Visit | Attending: Internal Medicine | Admitting: Internal Medicine

## 2016-02-27 DIAGNOSIS — Z1231 Encounter for screening mammogram for malignant neoplasm of breast: Secondary | ICD-10-CM

## 2016-02-28 ENCOUNTER — Encounter (HOSPITAL_COMMUNITY)
Admission: RE | Admit: 2016-02-28 | Discharge: 2016-02-28 | Disposition: A | Discharge status: Home / Self Care | Payer: Medicare Other | Source: Ambulatory Visit | Attending: Orthopaedic Surgery | Admitting: Orthopaedic Surgery

## 2016-02-28 ENCOUNTER — Encounter (HOSPITAL_COMMUNITY): Payer: Self-pay

## 2016-02-28 DIAGNOSIS — Z01812 Encounter for preprocedural laboratory examination: Secondary | ICD-10-CM | POA: Insufficient documentation

## 2016-02-28 HISTORY — DX: Peripheral vascular disease, unspecified: I73.9

## 2016-02-28 HISTORY — DX: Nonrheumatic mitral (valve) prolapse: I34.1

## 2016-02-28 HISTORY — DX: Stress incontinence (female) (male): N39.3

## 2016-02-28 LAB — BASIC METABOLIC PANEL
Anion gap: 5 (ref 5–15)
BUN: 23 mg/dL — AB (ref 6–20)
CALCIUM: 9.2 mg/dL (ref 8.9–10.3)
CO2: 27 mmol/L (ref 22–32)
CREATININE: 0.79 mg/dL (ref 0.44–1.00)
Chloride: 108 mmol/L (ref 101–111)
GFR calc Af Amer: >60 mL/min (ref >60)
GLUCOSE: 94 mg/dL (ref 65–99)
Potassium: 4 mmol/L (ref 3.5–5.1)
Sodium: 140 mmol/L (ref 135–145)

## 2016-02-28 LAB — SURGICAL PCR SCREEN
MRSA, PCR: NEGATIVE
STAPHYLOCOCCUS AUREUS: NEGATIVE

## 2016-02-28 LAB — ABO/RH: ABO/RH(D): A POS

## 2016-02-28 NOTE — Progress Notes (Signed)
EKG-09/22/15-EPIC  CXR-1V-09/22/15-EPIC  CBC/DIFF 02/14/16 on chart

## 2016-02-28 NOTE — Progress Notes (Signed)
Patient reports she has not been given instructions regarding Plavix in regards to surgery.  Note placed on preop instructions for patient to call surgeon's office to obtain Plavix instructions and patient given verbal instructions.  Patient instructed to call surgeon's office on 02/28/2016.

## 2016-02-28 NOTE — Progress Notes (Signed)
BMP done 02/28/16 faxed via EPIC to Dr Zollie Beckers.

## 2016-03-05 NOTE — Anesthesia Preprocedure Evaluation (Addendum)
Anesthesia Evaluation  Patient identified by MRN, date of birth, ID band Patient awake    Reviewed: Allergy & Precautions, NPO status , Patient's Chart, lab work & pertinent test results  Airway Mallampati: II  TM Distance: >3 FB Neck ROM: Full    Dental   Pulmonary neg pulmonary ROS,    breath sounds clear to auscultation       Cardiovascular hypertension, Pt. on medications  Rhythm:Regular Rate:Normal     Neuro/Psych TIA (no residual deficits)   GI/Hepatic Neg liver ROS, GERD  Medicated and Controlled,  Endo/Other  negative endocrine ROS  Renal/GU negative Renal ROS     Musculoskeletal  (+) Arthritis ,   Abdominal   Peds  Hematology   Anesthesia Other Findings   Reproductive/Obstetrics                             Anesthesia Physical  Anesthesia Plan  ASA: III  Anesthesia Plan: Spinal   Post-op Pain Management:    Induction:   Airway Management Planned: Nasal Cannula  Additional Equipment:   Intra-op Plan:   Post-operative Plan:   Informed Consent:   Plan Discussed with: CRNA, Anesthesiologist and Surgeon  Anesthesia Plan Comments:        Anesthesia Quick Evaluation Plavix has been held for 7 days, will proceed with spinal

## 2016-03-06 ENCOUNTER — Inpatient Hospital Stay (HOSPITAL_COMMUNITY): Payer: Medicare Other

## 2016-03-06 ENCOUNTER — Inpatient Hospital Stay (HOSPITAL_COMMUNITY): Payer: Medicare Other | Admitting: Anesthesiology

## 2016-03-06 ENCOUNTER — Inpatient Hospital Stay (HOSPITAL_COMMUNITY)
Admission: RE | Admit: 2016-03-06 | Discharge: 2016-03-09 | DRG: 468 | Disposition: A | Discharge status: Home Health | Payer: Medicare Other | Source: Ambulatory Visit | Attending: Orthopaedic Surgery | Admitting: Orthopaedic Surgery

## 2016-03-06 ENCOUNTER — Encounter (HOSPITAL_COMMUNITY): Payer: Self-pay | Admitting: Certified Registered Nurse Anesthetist

## 2016-03-06 ENCOUNTER — Encounter (HOSPITAL_COMMUNITY)
Admission: RE | Disposition: A | Discharge status: Home Health | Payer: Self-pay | Source: Ambulatory Visit | Attending: Orthopaedic Surgery

## 2016-03-06 DIAGNOSIS — X58XXXA Exposure to other specified factors, initial encounter: Secondary | ICD-10-CM | POA: Diagnosis present

## 2016-03-06 DIAGNOSIS — I739 Peripheral vascular disease, unspecified: Secondary | ICD-10-CM | POA: Diagnosis present

## 2016-03-06 DIAGNOSIS — M25451 Effusion, right hip: Secondary | ICD-10-CM | POA: Diagnosis present

## 2016-03-06 DIAGNOSIS — Y792 Prosthetic and other implants, materials and accessory orthopedic devices associated with adverse incidents: Secondary | ICD-10-CM | POA: Diagnosis present

## 2016-03-06 DIAGNOSIS — I10 Essential (primary) hypertension: Secondary | ICD-10-CM | POA: Diagnosis present

## 2016-03-06 DIAGNOSIS — Z7982 Long term (current) use of aspirin: Secondary | ICD-10-CM | POA: Diagnosis not present

## 2016-03-06 DIAGNOSIS — E785 Hyperlipidemia, unspecified: Secondary | ICD-10-CM | POA: Diagnosis present

## 2016-03-06 DIAGNOSIS — Y848 Other medical procedures as the cause of abnormal reaction of the patient, or of later complication, without mention of misadventure at the time of the procedure: Secondary | ICD-10-CM | POA: Diagnosis present

## 2016-03-06 DIAGNOSIS — Z91041 Radiographic dye allergy status: Secondary | ICD-10-CM

## 2016-03-06 DIAGNOSIS — K219 Gastro-esophageal reflux disease without esophagitis: Secondary | ICD-10-CM | POA: Diagnosis present

## 2016-03-06 DIAGNOSIS — Z885 Allergy status to narcotic agent status: Secondary | ICD-10-CM

## 2016-03-06 DIAGNOSIS — T8484XA Pain due to internal orthopedic prosthetic devices, implants and grafts, initial encounter: Principal | ICD-10-CM

## 2016-03-06 DIAGNOSIS — T8484XD Pain due to internal orthopedic prosthetic devices, implants and grafts, subsequent encounter: Secondary | ICD-10-CM

## 2016-03-06 DIAGNOSIS — Z7902 Long term (current) use of antithrombotics/antiplatelets: Secondary | ICD-10-CM

## 2016-03-06 DIAGNOSIS — S838X1A Sprain of other specified parts of right knee, initial encounter: Secondary | ICD-10-CM | POA: Diagnosis present

## 2016-03-06 DIAGNOSIS — Z8673 Personal history of transient ischemic attack (TIA), and cerebral infarction without residual deficits: Secondary | ICD-10-CM

## 2016-03-06 DIAGNOSIS — T84060A Wear of articular bearing surface of internal prosthetic right hip joint, initial encounter: Secondary | ICD-10-CM | POA: Diagnosis present

## 2016-03-06 DIAGNOSIS — M659 Synovitis and tenosynovitis, unspecified: Secondary | ICD-10-CM | POA: Diagnosis present

## 2016-03-06 DIAGNOSIS — Z888 Allergy status to other drugs, medicaments and biological substances status: Secondary | ICD-10-CM

## 2016-03-06 DIAGNOSIS — Z419 Encounter for procedure for purposes other than remedying health state, unspecified: Secondary | ICD-10-CM

## 2016-03-06 DIAGNOSIS — Z96641 Presence of right artificial hip joint: Secondary | ICD-10-CM

## 2016-03-06 DIAGNOSIS — M25551 Pain in right hip: Secondary | ICD-10-CM | POA: Diagnosis present

## 2016-03-06 DIAGNOSIS — M1611 Unilateral primary osteoarthritis, right hip: Secondary | ICD-10-CM | POA: Diagnosis present

## 2016-03-06 DIAGNOSIS — Z96649 Presence of unspecified artificial hip joint: Secondary | ICD-10-CM

## 2016-03-06 HISTORY — PX: TOTAL HIP ARTHROPLASTY: SHX124

## 2016-03-06 LAB — TYPE AND SCREEN
ABO/RH(D): A POS
Antibody Screen: NEGATIVE

## 2016-03-06 SURGERY — ARTHROPLASTY, HIP, TOTAL, ANTERIOR APPROACH
Anesthesia: Spinal | Site: Hip | Laterality: Right

## 2016-03-06 MED ORDER — ONDANSETRON HCL 4 MG PO TABS
4.0000 mg | ORAL_TABLET | Freq: Four times a day (QID) | ORAL | Status: DC | PRN
  Administered 2016-03-08: 4 mg via ORAL
  Filled 2016-03-06: qty 1

## 2016-03-06 MED ORDER — DOCUSATE SODIUM 100 MG PO CAPS
100.0000 mg | ORAL_CAPSULE | Freq: Two times a day (BID) | ORAL | Status: DC
  Administered 2016-03-06 – 2016-03-09 (×5): 100 mg via ORAL
  Filled 2016-03-06 (×6): qty 1

## 2016-03-06 MED ORDER — SODIUM CHLORIDE 0.9 % IR SOLN
Status: DC | PRN
  Administered 2016-03-06: 1000 mL

## 2016-03-06 MED ORDER — LACTATED RINGERS IV SOLN
INTRAVENOUS | Status: DC
  Administered 2016-03-06 (×4): via INTRAVENOUS

## 2016-03-06 MED ORDER — PROMETHAZINE HCL 25 MG/ML IJ SOLN
6.2500 mg | INTRAMUSCULAR | Status: DC | PRN
  Administered 2016-03-06: 6.25 mg via INTRAVENOUS

## 2016-03-06 MED ORDER — SODIUM CHLORIDE 0.9 % IV SOLN
1000.0000 mg | INTRAVENOUS | Status: AC
  Administered 2016-03-06: 1000 mg via INTRAVENOUS
  Filled 2016-03-06: qty 10
  Filled 2016-03-06: qty 1100

## 2016-03-06 MED ORDER — FENTANYL CITRATE (PF) 100 MCG/2ML IJ SOLN
INTRAMUSCULAR | Status: AC
  Filled 2016-03-06: qty 2

## 2016-03-06 MED ORDER — SODIUM CHLORIDE 0.9 % IJ SOLN
INTRAMUSCULAR | Status: AC
  Filled 2016-03-06: qty 10

## 2016-03-06 MED ORDER — OXYCODONE HCL 5 MG PO TABS
5.0000 mg | ORAL_TABLET | ORAL | Status: DC | PRN
  Administered 2016-03-06: 5 mg via ORAL
  Administered 2016-03-06 – 2016-03-08 (×10): 10 mg via ORAL
  Administered 2016-03-08: 5 mg via ORAL
  Administered 2016-03-09 (×2): 10 mg via ORAL
  Filled 2016-03-06 (×2): qty 2
  Filled 2016-03-06: qty 1
  Filled 2016-03-06 (×11): qty 2

## 2016-03-06 MED ORDER — ASPIRIN 81 MG PO CHEW
81.0000 mg | CHEWABLE_TABLET | Freq: Every day | ORAL | Status: DC
  Administered 2016-03-06 – 2016-03-09 (×4): 81 mg via ORAL
  Filled 2016-03-06 (×4): qty 1

## 2016-03-06 MED ORDER — FENTANYL CITRATE (PF) 100 MCG/2ML IJ SOLN
25.0000 ug | INTRAMUSCULAR | Status: DC | PRN
  Administered 2016-03-06: 50 ug via INTRAVENOUS

## 2016-03-06 MED ORDER — 0.9 % SODIUM CHLORIDE (POUR BTL) OPTIME
TOPICAL | Status: DC | PRN
  Administered 2016-03-06: 1000 mL

## 2016-03-06 MED ORDER — FUROSEMIDE 20 MG PO TABS: 20.0000 mg | ORAL_TABLET | Freq: Every day | ORAL | Status: DC | PRN

## 2016-03-06 MED ORDER — DIPHENHYDRAMINE HCL 12.5 MG/5ML PO ELIX: 12.5000 mg | ORAL_SOLUTION | ORAL | Status: DC | PRN

## 2016-03-06 MED ORDER — ACETAMINOPHEN 500 MG PO TABS: 1000.0000 mg | ORAL_TABLET | Freq: Four times a day (QID) | ORAL | Status: DC | PRN

## 2016-03-06 MED ORDER — ONDANSETRON HCL 4 MG/2ML IJ SOLN
4.0000 mg | Freq: Four times a day (QID) | INTRAMUSCULAR | Status: DC | PRN
  Filled 2016-03-06: qty 2

## 2016-03-06 MED ORDER — PROPOFOL 10 MG/ML IV BOLUS
INTRAVENOUS | Status: AC
  Filled 2016-03-06: qty 40

## 2016-03-06 MED ORDER — CEFAZOLIN SODIUM-DEXTROSE 2-4 GM/100ML-% IV SOLN
INTRAVENOUS | Status: AC
  Filled 2016-03-06: qty 100

## 2016-03-06 MED ORDER — CLOPIDOGREL BISULFATE 75 MG PO TABS
75.0000 mg | ORAL_TABLET | Freq: Every day | ORAL | Status: DC
  Administered 2016-03-07 – 2016-03-09 (×3): 75 mg via ORAL
  Filled 2016-03-06 (×3): qty 1

## 2016-03-06 MED ORDER — FENTANYL CITRATE (PF) 100 MCG/2ML IJ SOLN
INTRAMUSCULAR | Status: DC | PRN
  Administered 2016-03-06: 100 ug via INTRAVENOUS

## 2016-03-06 MED ORDER — COQ10 200 MG PO CAPS: 200.0000 mg | ORAL_CAPSULE | Freq: Every day | ORAL | Status: DC

## 2016-03-06 MED ORDER — ADULT MULTIVITAMIN W/MINERALS CH
1.0000 | ORAL_TABLET | Freq: Every day | ORAL | Status: DC
  Administered 2016-03-06 – 2016-03-08 (×3): 1 via ORAL
  Filled 2016-03-06 (×3): qty 1

## 2016-03-06 MED ORDER — ROSUVASTATIN CALCIUM 5 MG PO TABS
10.0000 mg | ORAL_TABLET | ORAL | Status: DC
  Administered 2016-03-06: 10 mg via ORAL
  Filled 2016-03-06: qty 2

## 2016-03-06 MED ORDER — EPHEDRINE SULFATE 50 MG/ML IJ SOLN
INTRAMUSCULAR | Status: DC | PRN
  Administered 2016-03-06 (×3): 5 mg via INTRAVENOUS

## 2016-03-06 MED ORDER — METOCLOPRAMIDE HCL 5 MG PO TABS: 5.0000 mg | ORAL_TABLET | Freq: Three times a day (TID) | ORAL | Status: DC | PRN

## 2016-03-06 MED ORDER — PANTOPRAZOLE SODIUM 40 MG PO TBEC
40.0000 mg | DELAYED_RELEASE_TABLET | Freq: Two times a day (BID) | ORAL | Status: DC
  Administered 2016-03-06 – 2016-03-09 (×6): 40 mg via ORAL
  Filled 2016-03-06 (×6): qty 1

## 2016-03-06 MED ORDER — STERILE WATER FOR IRRIGATION IR SOLN
Status: DC | PRN
  Administered 2016-03-06: 2000 mL

## 2016-03-06 MED ORDER — METOCLOPRAMIDE HCL 5 MG/ML IJ SOLN: 5.0000 mg | Freq: Three times a day (TID) | INTRAMUSCULAR | Status: DC | PRN

## 2016-03-06 MED ORDER — ASPIRIN 81 MG PO CHEW
81.0000 mg | CHEWABLE_TABLET | Freq: Every day | ORAL | Status: DC
  Filled 2016-03-06: qty 1

## 2016-03-06 MED ORDER — HYDROMORPHONE HCL 1 MG/ML IJ SOLN
0.5000 mg | INTRAMUSCULAR | Status: DC | PRN
  Administered 2016-03-06 (×2): 0.5 mg via INTRAVENOUS
  Filled 2016-03-06 (×2): qty 1

## 2016-03-06 MED ORDER — POLYETHYLENE GLYCOL 3350 17 G PO PACK: 17.0000 g | PACK | Freq: Every day | ORAL | Status: DC | PRN

## 2016-03-06 MED ORDER — SODIUM CHLORIDE 0.9 % IV SOLN
INTRAVENOUS | Status: DC
  Administered 2016-03-06: 1000 mL via INTRAVENOUS
  Administered 2016-03-06: 20:00:00 via INTRAVENOUS

## 2016-03-06 MED ORDER — CEFAZOLIN SODIUM-DEXTROSE 2-4 GM/100ML-% IV SOLN
2.0000 g | INTRAVENOUS | Status: AC
Start: 2016-03-06 — End: 2016-03-06
  Administered 2016-03-06: 2 g via INTRAVENOUS
  Filled 2016-03-06: qty 100

## 2016-03-06 MED ORDER — ALUM & MAG HYDROXIDE-SIMETH 200-200-20 MG/5ML PO SUSP
30.0000 mL | ORAL | Status: DC | PRN
  Administered 2016-03-07: 30 mL via ORAL
  Filled 2016-03-06: qty 30

## 2016-03-06 MED ORDER — PROPOFOL 10 MG/ML IV BOLUS
INTRAVENOUS | Status: AC
  Filled 2016-03-06: qty 20

## 2016-03-06 MED ORDER — CELECOXIB 200 MG PO CAPS
200.0000 mg | ORAL_CAPSULE | Freq: Two times a day (BID) | ORAL | Status: DC
  Administered 2016-03-06 – 2016-03-09 (×5): 200 mg via ORAL
  Filled 2016-03-06 (×6): qty 1

## 2016-03-06 MED ORDER — BUPIVACAINE HCL (PF) 0.5 % IJ SOLN
INTRAMUSCULAR | Status: DC | PRN
  Administered 2016-03-06: 3 mL

## 2016-03-06 MED ORDER — PROPOFOL 500 MG/50ML IV EMUL
INTRAVENOUS | Status: DC | PRN
  Administered 2016-03-06: 50 ug/kg/min via INTRAVENOUS

## 2016-03-06 MED ORDER — FENTANYL CITRATE (PF) 100 MCG/2ML IJ SOLN
INTRAMUSCULAR | Status: AC
Start: 2016-03-06 — End: 2016-03-06
  Administered 2016-03-06: 50 ug via INTRAVENOUS
  Filled 2016-03-06: qty 2

## 2016-03-06 MED ORDER — MENTHOL 3 MG MT LOZG: 1.0000 | LOZENGE | OROMUCOSAL | Status: DC | PRN

## 2016-03-06 MED ORDER — ACETAMINOPHEN 650 MG RE SUPP: 650.0000 mg | Freq: Four times a day (QID) | RECTAL | Status: DC | PRN

## 2016-03-06 MED ORDER — CEFAZOLIN IN D5W 1 GM/50ML IV SOLN
1.0000 g | Freq: Four times a day (QID) | INTRAVENOUS | Status: AC
  Administered 2016-03-06 (×2): 1 g via INTRAVENOUS
  Filled 2016-03-06 (×2): qty 50

## 2016-03-06 MED ORDER — PROMETHAZINE HCL 25 MG/ML IJ SOLN
INTRAMUSCULAR | Status: AC
  Filled 2016-03-06: qty 1

## 2016-03-06 MED ORDER — PHENOL 1.4 % MT LIQD: 1.0000 | OROMUCOSAL | Status: DC | PRN

## 2016-03-06 MED ORDER — CHLORHEXIDINE GLUCONATE 4 % EX LIQD: 60.0000 mL | Freq: Once | CUTANEOUS | Status: DC

## 2016-03-06 MED ORDER — VITAMIN D 1000 UNITS PO TABS
2000.0000 [IU] | ORAL_TABLET | Freq: Every day | ORAL | Status: DC
  Administered 2016-03-07 – 2016-03-09 (×2): 2000 [IU] via ORAL
  Filled 2016-03-06 (×3): qty 2

## 2016-03-06 MED ORDER — METHOCARBAMOL 1000 MG/10ML IJ SOLN
500.0000 mg | Freq: Four times a day (QID) | INTRAMUSCULAR | Status: DC | PRN
  Administered 2016-03-06: 500 mg via INTRAVENOUS
  Filled 2016-03-06: qty 5
  Filled 2016-03-06: qty 550

## 2016-03-06 MED ORDER — ACETAMINOPHEN 325 MG PO TABS: 650.0000 mg | ORAL_TABLET | Freq: Four times a day (QID) | ORAL | Status: DC | PRN

## 2016-03-06 MED ORDER — METHOCARBAMOL 500 MG PO TABS
500.0000 mg | ORAL_TABLET | Freq: Four times a day (QID) | ORAL | Status: DC | PRN
  Administered 2016-03-06 – 2016-03-08 (×5): 500 mg via ORAL
  Filled 2016-03-06 (×5): qty 1

## 2016-03-06 MED ORDER — IRBESARTAN 150 MG PO TABS
150.0000 mg | ORAL_TABLET | Freq: Every day | ORAL | Status: DC
  Administered 2016-03-06 – 2016-03-09 (×3): 150 mg via ORAL
  Filled 2016-03-06 (×4): qty 1

## 2016-03-06 SURGICAL SUPPLY — 45 items
BAG ZIPLOCK 12X15 (MISCELLANEOUS) ×3 IMPLANT
BENZOIN TINCTURE PRP APPL 2/3 (GAUZE/BANDAGES/DRESSINGS) ×3 IMPLANT
BLADE SAW SGTL 18X1.27X75 (BLADE) ×2 IMPLANT
BLADE SAW SGTL 18X1.27X75MM (BLADE) ×1
CELLS DAT CNTRL 66122 CELL SVR (MISCELLANEOUS) ×1 IMPLANT
CLOSURE WOUND 1/2 X4 (GAUZE/BANDAGES/DRESSINGS) ×2
CLOTH BEACON ORANGE TIMEOUT ST (SAFETY) ×3 IMPLANT
CUP SECTOR GRIPTON 50MM (Cup) ×3 IMPLANT
DRAPE STERI IOBAN 125X83 (DRAPES) ×3 IMPLANT
DRAPE U-SHAPE 47X51 STRL (DRAPES) ×9 IMPLANT
DRESSING AQUACEL AG SP 3.5X10 (GAUZE/BANDAGES/DRESSINGS) ×1 IMPLANT
DRSG AQUACEL AG SP 3.5X10 (GAUZE/BANDAGES/DRESSINGS) ×3
DURAPREP 26ML APPLICATOR (WOUND CARE) ×3 IMPLANT
ELECT REM PT RETURN 9FT ADLT (ELECTROSURGICAL) ×3
ELECTRODE REM PT RTRN 9FT ADLT (ELECTROSURGICAL) ×1 IMPLANT
GLOVE BIO SURGEON STRL SZ7.5 (GLOVE) ×3 IMPLANT
GLOVE BIOGEL PI IND STRL 7.0 (GLOVE) ×2 IMPLANT
GLOVE BIOGEL PI IND STRL 7.5 (GLOVE) ×4 IMPLANT
GLOVE BIOGEL PI IND STRL 8 (GLOVE) ×2 IMPLANT
GLOVE BIOGEL PI INDICATOR 7.0 (GLOVE) ×4
GLOVE BIOGEL PI INDICATOR 7.5 (GLOVE) ×8
GLOVE BIOGEL PI INDICATOR 8 (GLOVE) ×4
GLOVE ECLIPSE 8.0 STRL XLNG CF (GLOVE) ×3 IMPLANT
GLOVE SURG SS PI 7.0 STRL IVOR (GLOVE) ×3 IMPLANT
GLOVE SURG SS PI 7.5 STRL IVOR (GLOVE) ×3 IMPLANT
GOWN STRL REUS W/ TWL XL LVL3 (GOWN DISPOSABLE) ×1 IMPLANT
GOWN STRL REUS W/TWL LRG LVL3 (GOWN DISPOSABLE) ×6 IMPLANT
GOWN STRL REUS W/TWL XL LVL3 (GOWN DISPOSABLE) ×8 IMPLANT
HANDPIECE INTERPULSE COAX TIP (DISPOSABLE) ×2
HEAD FEM STD 32X+1 STRL (Hips) IMPLANT
HEAD FEM STD 32X+5 STRL (Hips) ×3 IMPLANT
HOLDER FOLEY CATH W/STRAP (MISCELLANEOUS) ×3 IMPLANT
LINER ACETABULAR 32X50 (Liner) ×3 IMPLANT
PACK ANTERIOR HIP CUSTOM (KITS) ×3 IMPLANT
RTRCTR WOUND ALEXIS 18CM MED (MISCELLANEOUS) ×3
SET HNDPC FAN SPRY TIP SCT (DISPOSABLE) ×1 IMPLANT
STRIP CLOSURE SKIN 1/2X4 (GAUZE/BANDAGES/DRESSINGS) ×4 IMPLANT
SUT ETHIBOND NAB CT1 #1 30IN (SUTURE) ×3 IMPLANT
SUT MNCRL AB 4-0 PS2 18 (SUTURE) IMPLANT
SUT VIC AB 0 CT1 36 (SUTURE) ×3 IMPLANT
SUT VIC AB 1 CT1 36 (SUTURE) ×3 IMPLANT
SUT VIC AB 2-0 CT1 27 (SUTURE) ×4
SUT VIC AB 2-0 CT1 TAPERPNT 27 (SUTURE) ×2 IMPLANT
TRAY FOLEY W/METER SILVER 14FR (SET/KITS/TRAYS/PACK) ×3 IMPLANT
YANKAUER SUCT BULB TIP NO VENT (SUCTIONS) ×3 IMPLANT

## 2016-03-06 NOTE — Progress Notes (Signed)
PHARMACIST - PHYSICIAN ORDER COMMUNICATION  CONCERNING: P&T Medication Policy on Herbal Medications  DESCRIPTION:  This patient's order for:  CoQ10  has been noted.  This product(s) is classified as an "herbal" or natural product. Due to a lack of definitive safety studies or FDA approval, nonstandard manufacturing practices, plus the potential risk of unknown drug-drug interactions while on inpatient medications, the Pharmacy and Therapeutics Committee does not permit the use of "herbal" or natural products of this type within [facility].   ACTION TAKEN: The pharmacy department is unable to verify this order at this time and your patient has been informed of this safety policy. Please reevaluate patient's clinical condition at discharge and address if the herbal or natural product(s) should be resumed at that time.   Thanks Dorrene German 03/06/2016 2:01 PM

## 2016-03-06 NOTE — Anesthesia Procedure Notes (Signed)
Spinal  Start time: 03/06/2016 9:19 AM End time: 03/06/2016 9:24 AM Staffing Resident/CRNA: Gean Maidens Performed: resident/CRNA  Preanesthetic Checklist Completed: patient identified, site marked, surgical consent, pre-op evaluation, timeout performed, IV checked, risks and benefits discussed and monitors and equipment checked Spinal Block Patient position: sitting Prep: Betadine and site prepped and draped Patient monitoring: heart rate, continuous pulse ox and blood pressure Approach: midline Location: L2-3 Injection technique: single-shot Needle Needle type: Spinocan  Needle gauge: 22 G Needle length: 9 cm Needle insertion depth: 7 cm Additional Notes Pt sitting position, sterile prep and drape, negative paresthesia, negative heme.

## 2016-03-06 NOTE — H&P (Signed)
TOTAL HIP REVISION ADMISSION H&P  Patient is admitted for right revision total hip arthroplasty.  Subjective:  Chief Complaint: right hip pain  HPI: Lindsey Barnett, 81 y.o. female, has a history of pain and functional disability in the right hip due to arthritis and patient has failed non-surgical conservative treatments for greater than 12 weeks to include NSAID's and/or analgesics, corticosteriod injections, flexibility and strengthening excercises, supervised PT with diminished ADL's post treatment, use of assistive devices and activity modification. The indications for the revision total hip arthroplasty are bearing surface wear leading to  symptomatic synovitis and arthritis of the acetabulum.  Onset of symptoms was abrupt starting 1 years ago with stable course since that time.  Prior procedures on the right hip include hemi-arthroplasty.  Patient currently rates pain in the right hip at 9 out of 10 with activity.  There is worsening of pain with activity and weight bearing, pain that interfers with activities of daily living and pain with passive range of motion. Patient has evidence of joint space narrowing by imaging studies.  This condition presents safety issues increasing the risk of falls.  This patient has had failure of hemi-arthroplasty.  There is no current active infection.  Patient Active Problem List   Diagnosis Date Noted  . Pain due to right hip joint prosthesis ([place]) 03/06/2016  . Hip fracture requiring operative repair ([place]) 09/22/2015  . Hip injury   . Subcapital fracture of hip ([place]) 09/21/2015  . Hypokalemia 09/21/2015  . HTN (hypertension) 09/21/2015  . GERD (gastroesophageal reflux disease) 09/21/2015  . Lateral meniscal tear 09/16/2011   Past Medical History:  Diagnosis Date  . Acute meniscal tear, lateral RIGHT  . Arthritis HIP  . Cataract immature   . GERD (gastroesophageal reflux disease) OCCASIONAL  . History of TIA (transient ischemic attack) 2005--  NO  RESIDUAL  . Hyperlipidemia   . Hypertension   . MVP (mitral valve prolapse)   . Peripheral vascular disease ([place])   . Stress incontinence   . Swelling of right knee joint     Past Surgical History:  Procedure Laterality Date  . ANTERIOR APPROACH HEMI HIP ARTHROPLASTY Right 09/22/2015   Procedure: ANTERIOR APPROACH HEMI HIP ARTHROPLASTY;  Surgeon: Dorna Leitz, MD;  Location: [facility];  Service: Orthopedics;  Laterality: Right;  . CHOLECYSTECTOMY  1975  . KNEE ARTHROSCOPY  09/16/2011   Procedure: ARTHROSCOPY KNEE;  Surgeon: Gearlean Alf, MD;  Location: [facility];  Service: Orthopedics;  Laterality: Right;  WITH lateral meniscal debridement  . VAGINAL HYSTERECTOMY  1973    Prescriptions Prior to Admission  Medication Sig Dispense Refill Last Dose  . acetaminophen (TYLENOL) 500 MG tablet Take 1,000 mg by mouth every 6 (six) hours as needed for mild pain.     . celecoxib (CELEBREX) 200 MG capsule Take 200-400 mg by mouth daily as needed for mild pain.     . Cholecalciferol 2000 units CAPS Take 2,000 Units by mouth daily.   09/21/2015 at Unknown time  . clopidogrel (PLAVIX) 75 MG tablet Take 75 mg by mouth daily.   09/21/2015 at Unknown time  . Coenzyme Q10 (COQ10) 200 MG CAPS Take 200 mg by mouth daily.    09/21/2015 at Unknown time  . estradiol (ESTRACE) 0.5 MG tablet Take 0.5 mg by mouth daily. Take for 2 weeks and off for 1 week   09/21/2015 at Unknown time  . furosemide (LASIX) 20 MG tablet Take 20 mg by mouth daily as needed for  edema.     . Multiple Vitamin (MULTIVITAMIN) tablet Take 1 tablet by mouth at bedtime.    09/20/2015 at Unknown time  . pantoprazole (PROTONIX) 40 MG tablet Take 40 mg by mouth 2 (two) times daily.    09/21/2015 at Unknown time  . polyethylene glycol (MIRALAX / GLYCOLAX) packet Take 17 g by mouth daily. (Patient taking differently: Take 17 g by mouth daily as needed for mild constipation. ) 14 each 0   . rosuvastatin (CRESTOR) 10 MG tablet Take 10 mg by mouth 3  (three) times a week. MWF     . amoxicillin (AMOXIL) 500 MG capsule Take 2,000 mg by mouth daily as needed (1 hour prior to dental appointments).     Marland Kitchen aspirin 81 MG tablet Take 81 mg by mouth daily.     . valsartan (DIOVAN) 160 MG tablet Take 160 mg by mouth daily. If systolic blood pressure greater than 140      Allergies  Allergen Reactions  . Codeine Other (See Comments)    STOMACHE SPASMS  . Contrast Media [Iodinated Diagnostic Agents] Hives  . Iodine Other (See Comments)    Hives?  . Sodium Tetradecyl Sulfate Other (See Comments)    Social History  Substance Use Topics  . Smoking status: Never Smoker  . Smokeless tobacco: Never Used  . Alcohol use Yes     Comment: OCCASIONAL    History reviewed. No pertinent family history.    Review of Systems  Musculoskeletal: Positive for back pain and joint pain.  All other systems reviewed and are negative.   Objective:  Physical Exam  Constitutional: She is oriented to person, place, and time. She appears well-developed and well-nourished.  HENT:  Head: Normocephalic and atraumatic.  Eyes: EOM are normal. Pupils are equal, round, and reactive to light.  Neck: Normal range of motion. Neck supple.  Cardiovascular: Normal rate and regular rhythm.   Respiratory: Effort normal and breath sounds normal.  GI: Soft. Bowel sounds are normal.  Musculoskeletal:       Right hip: She exhibits decreased range of motion, decreased strength and tenderness.  Neurological: She is alert and oriented to person, place, and time.  Skin: Skin is warm and dry.  Psychiatric: She has a normal mood and affect.    Vital signs in last 24 hours:     Labs:   Estimated body mass index is 25.84 kg/m as calculated from the following:   Height as of 02/28/16: 5\' 7"  (1.702 m).   Weight as of 02/28/16: 74.8 kg (165 lb).  Imaging Review:  Plain radiographs demonstrate a well-positioned right hip hemiarthroplasty  Assessment/Plan:  Painful right  hip hemiarthroplasty possibly from right hip acetabular arthritis  To the OR today for assessment of her right hip components and likely conversion of her hemiarthroplasty to a total hip replacement.

## 2016-03-06 NOTE — Anesthesia Postprocedure Evaluation (Signed)
Anesthesia Post Note  Patient: Lindsey Barnett  Procedure(s) Performed: Procedure(s) (LRB): CONVERSION RIGHT HIP HEMIARTHROPLASTY TO RIGHT TOTAL HIP ARTHROPLASTY ANTERIOR APPROACH (Right)  Patient location during evaluation: PACU Anesthesia Type: Spinal Level of consciousness: oriented and awake and alert Pain management: pain level controlled Vital Signs Assessment: post-procedure vital signs reviewed and stable Respiratory status: spontaneous breathing, respiratory function stable and patient connected to nasal cannula oxygen Cardiovascular status: blood pressure returned to baseline and stable Postop Assessment: no headache and no backache Anesthetic complications: no    Last Vitals:  Vitals:   03/06/16 1130 03/06/16 1145  BP: 133/62 140/65  Pulse: 71 71  Resp: 18 14  Temp: 36.4 C     Last Pain:  Vitals:   03/06/16 1145  TempSrc:   PainSc: 0-No pain                 Reginal Lutes

## 2016-03-06 NOTE — Op Note (Signed)
NAMEHELENANN, Lindsey Barnett                 ACCOUNT NO.:  [id number]  MEDICAL RECORD NO.:  [id number]  LOCATION:  [facility]                         FACILITY:  [facility]  PHYSICIAN:  Lind Guest. Ninfa Linden, M.D.DATE OF BIRTH:  January 13, 1935  DATE OF PROCEDURE:  03/06/2016 DATE OF DISCHARGE:                              OPERATIVE REPORT   PREOPERATIVE DIAGNOSIS:  Painful right hip hemiarthroplasty.  POSTOPERATIVE DIAGNOSIS:  Painful right hip hemiarthroplasty.  PROCEDURE:  Conversion of right hip hemiarthroplasty to a total hip replacement.  FINDINGS:  No evidence of infection with synovitis, right hip joint and arthritis of the acetabulum.  No loosening of the femoral component.  IMPLANTS:  DePuy Sector Gription acetabular component size 50, size 32+ 0 neutral polyethylene liner, size 36+ 5 metal hip ball.  SURGEON:  Lind Guest. Ninfa Linden, M.D.  ASSISTANT:  Erskine Emery, PA-C.  ANESTHESIA:  Spinal.  ANTIBIOTICS:  2 g of IV Ancef.  BLOOD LOSS:  100 to 150 mL.  COMPLICATIONS:  None.  INDICATIONS:  Ms. Plunk is a very pleasant 81 year old female, who unfortunately earlier this year in the winter sustained a mechanical fall suffering a right hip femoral neck fracture.  She was taken to the operating room appropriately by another surgeon in town who performed a right anterior hip hemiarthroplasty.  Postoperatively, she has continued to complain of pain of that right hip.  X-rays showed no evidence of loosening of the components at all.  She did have an intra-articular injection of lidocaine by the other surgeon and this gave her a day or 2 of pain relief.  With continued pain in the hip itself, at this point, we recommended a hemiarthroplasty.  We told her we could do this through the direct anterior approach as well and would be able to assess the femoral component for loosening or even looking for any evidence of infection.  All her labs have been normal otherwise.  After risks  and benefits were discussed in detail including the risk of acute blood loss anemia, nerve and vessel injury, fracture, infection, DVT and dislocation, she understood our goals are hopefully decreased pain, improved mobility and overall improved quality of life.  PROCEDURE DESCRIPTION:  After informed consent was obtained, appropriate right hip was marked.  She was brought to the operating room where spinal anesthesia was obtained while she was on her stretcher.  She was then laid in a supine position on the stretcher.  A Foley catheter was placed.  Her leg lengths were assessed and found to be equal.  We then placed traction boots on both of her feet.  Next, we placed her supine on the Hana fracture table with the perineal post in place and both legs in inline skeletal traction devices, but no traction applied.  We also got preoperative radiograph using the C-arm before prepping and draping to get an idea of assessing her offset and leg lengths intraoperative. We then prepped the right hip with DuraPrep and sterile drapes.  A time- out was called and she was identified as correct patient and correct right hip.  I then assessed her incision and I felt it was definitely little bit more oblique than what I normally  do and decided to take just slightly less oblique incision.  So, I made a separate incision of her hip over the anterolateral thigh, this was just inferior and posterior to the anterior superior iliac spine and carried this obliquely down the leg.  We dissected down the tensor fascia lata muscle and the tensor fascia was then divided longitudinally to proceed with a direct anterior approach to the hip.  We were able to identify the hip capsule and that was closed completely.  There was no evidence of infection.  I opened up the hip capsule in an L-type format, finding joint effusions and synovitis, but again, there was no evidence of infection at all.  I had to excise minimal  capsule and was able to then place a bone hook into the acetabulum with traction and external rotation.  I was able to dislocate the hip.  We knocked off the hip ball and then assessed the femoral component and it was found to be nice tight and intact.  With the leg externally rotated to 120 degrees, I kept the leg up and straight and placed a bent Hohmann over the medial acetabular rim.  I assessed the acetabulum and found some areas of synovitis around the acetabulum, labral tearing and some evidence of arthritis in the socket itself, which was mild though.  I, at that point, decided to proceed with a total hip arthroplasty.  We removed remaining remnants of the labrum and the fovea from the hip itself.  We then began reaming under direct visualization from a size 43 reamer in stepwise increments up to a size 50 with all reamers under direct visualization and the last reamer also under direct fluoroscopy, so we could obtain our depth of reaming, our inclination and anteversion.  Once we were pleased with this, we placed the real DePuy Sector Gription acetabular component size 50 and a 32+ 0 neutral polyethylene liner.  We then trialed a hip ball and we felt that a 36+ 1.5 would be fine and I put a real metal 36+ 1.5 hip ball in place and reduced this in the acetabulum.  I felt there was still just too much shuck, even though, I could externally rotate to 90 degrees, passed this.  I could dislocate her, assessing her radiographically, I felt that we needed definitely little bit more offset and leg length.  We then took that real 32+ 1 hip ball and with 32+ 5, we reduced this in the acetabulum and I was definitely pleased at that point with leg length and offset as well as stability.  We then irrigated the soft tissue with normal saline solution using pulsatile lavage.  We were able to close the joint capsule with interrupted #1 Ethibond suture followed by running #1 Vicryl in the tensor  fascia, 0 Vicryl in the deep tissue, 2-0 Vicryl in the subcutaneous tissue, 4-0 Monocryl for subcuticular stitch and Steri-Strips on the skin.  An Aquacel dressing was applied.  She was taken off the Hana table and taken to the recovery room in stable condition.  All final counts were correct.  There were no complications noted.  Of note, Erskine Emery, PA- C assisted in the entire case.  His assistance was crucial for facilitating all aspects of this case.     Lind Guest. Ninfa Linden, M.D.     CYB/MEDQ  D:  03/06/2016  T:  03/06/2016  Job:  [id number]

## 2016-03-06 NOTE — Brief Op Note (Signed)
03/06/2016  10:35 AM  PATIENT:  Lindsey Barnett  81 y.o. female  PRE-OPERATIVE DIAGNOSIS:  Painful right hip hemiarthroplasty  POST-OPERATIVE DIAGNOSIS:  same  PROCEDURE:  Procedure(s): CONVERSION RIGHT HIP HEMIARTHROPLASTY TO RIGHT TOTAL HIP ARTHROPLASTY ANTERIOR APPROACH (Right)  SURGEON:  Surgeon(s) and Role:    * Mcarthur Rossetti, MD - Primary  PHYSICIAN ASSISTANT: Benita Stabile, PA-C  ANESTHESIA:   spinal  EBL:  Total I/O In: 1000 [I.V.:1000] Out: 300 [Urine:150; Blood:150]  COUNTS:  YES  DICTATION: .Other Dictation: Dictation Number [id number]  PLAN OF CARE: Admit to inpatient   PATIENT DISPOSITION:  PACU - hemodynamically stable.   Delay start of Pharmacological VTE agent (>24hrs) due to surgical blood loss or risk of bleeding: no

## 2016-03-06 NOTE — Transfer of Care (Signed)
Immediate Anesthesia Transfer of Care Note  Patient: Lindsey Barnett  Procedure(s) Performed: Procedure(s): CONVERSION RIGHT HIP HEMIARTHROPLASTY TO RIGHT TOTAL HIP ARTHROPLASTY ANTERIOR APPROACH (Right)  Patient Location: PACU  Anesthesia Type:Spinal  Level of Consciousness: awake, alert  and oriented  Airway & Oxygen Therapy: Patient Spontanous Breathing and Patient connected to face mask oxygen  Post-op Assessment: Report given to RN and Post -op Vital signs reviewed and stable  Post vital signs: Reviewed and stable  Last Vitals:  Vitals:   03/06/16 0734  BP: 131/78  Pulse: 77  Resp: 18  Temp: 36.5 C    Last Pain:  Vitals:   03/06/16 0738  TempSrc:   PainSc: 0-No pain      Patients Stated Pain Goal: 4 ([id number])  Complications: No apparent anesthesia complications

## 2016-03-07 LAB — BASIC METABOLIC PANEL
ANION GAP: 5 (ref 5–15)
BUN: 14 mg/dL (ref 6–20)
CALCIUM: 8.3 mg/dL — AB (ref 8.9–10.3)
CO2: 27 mmol/L (ref 22–32)
Chloride: 106 mmol/L (ref 101–111)
Creatinine, Ser: 0.72 mg/dL (ref 0.44–1.00)
GLUCOSE: 101 mg/dL — AB (ref 65–99)
POTASSIUM: 3.3 mmol/L — AB (ref 3.5–5.1)
Sodium: 138 mmol/L (ref 135–145)

## 2016-03-07 LAB — CBC
HEMATOCRIT: 34.8 % — AB (ref 36.0–46.0)
Hemoglobin: 11.3 g/dL — ABNORMAL LOW (ref 12.0–15.0)
MCH: 28.3 pg (ref 26.0–34.0)
MCHC: 32.5 g/dL (ref 30.0–36.0)
MCV: 87 fL (ref 78.0–100.0)
Platelets: 227 10*3/uL (ref 150–400)
RBC: 4 MIL/uL (ref 3.87–5.11)
RDW: 15.1 % (ref 11.5–15.5)
WBC: 7.2 10*3/uL (ref 4.0–10.5)

## 2016-03-07 MED ORDER — OXYCODONE-ACETAMINOPHEN 5-325 MG PO TABS: 1.0000 | ORAL_TABLET | ORAL | 0 refills | Status: DC | PRN

## 2016-03-07 NOTE — Progress Notes (Signed)
Physical Therapy Treatment Patient Details Name: Lindsey Barnett MRN: [id number] DOB: [birth year] Today's Date: March 10, 2016    History of Present Illness Pt with conversion of R hip hemiarthroplasty to THR-DA. Pt's PMH includes HTN, PVD, and TIA.     PT Comments    Pt motivated and progressing well with mobility.    Follow Up Recommendations  Home health PT     Equipment Recommendations  None recommended by PT    Recommendations for Other Services OT consult     Precautions / Restrictions Precautions Precautions: Fall Restrictions Weight Bearing Restrictions: No Other Position/Activity Restrictions: WBAT    Mobility  Bed Mobility Overal bed mobility: Needs Assistance Bed Mobility: Sit to Supine       Sit to supine: Min assist   General bed mobility comments: cues for sequence and use of L LE to self assist  Transfers Overall transfer level: Needs assistance Equipment used: Rolling walker (2 wheeled) Transfers: Sit to/from Stand Sit to Stand: Min guard         General transfer comment: verbal cues for hand placement and LE management.  Ambulation/Gait Ambulation/Gait assistance: Min assist;Min guard Ambulation Distance (Feet): 300 Feet Assistive device: Rolling walker (2 wheeled) Gait Pattern/deviations: Step-to pattern;Step-through pattern;Shuffle;Trunk flexed Gait velocity: decr Gait velocity interpretation: Below normal speed for age/gender General Gait Details: cues for sequence, posture and position from [employer]            Wheelchair Mobility    Modified Rankin (Stroke Patients Only)       Balance                                    Cognition Arousal/Alertness: Awake/alert Behavior During Therapy: WFL for tasks assessed/performed Overall Cognitive Status: Within Functional Limits for tasks assessed                      Exercises      General Comments        Pertinent Vitals/Pain Pain Assessment:  0-10 Pain Score: 4  Pain Location: R hip Pain Descriptors / Indicators: Aching;Sore Pain Intervention(s): Limited activity within patient's tolerance;Monitored during session;Premedicated before session;Ice applied    Home Living                      Prior Function            PT Goals (current goals can now be found in the care plan section) Acute Rehab PT Goals Patient Stated Goal: to return to independence with activities PT Goal Formulation: With patient Time For Goal Achievement: 03/09/16 Potential to Achieve Goals: Good Progress towards PT goals: Progressing toward goals    Frequency  7X/week    PT Plan Current plan remains appropriate    Co-evaluation             End of Session Equipment Utilized During Treatment: Gait belt Activity Tolerance: Patient tolerated treatment well Patient left: in bed;with call bell/phone within reach;with family/visitor present     Time: [id number] PT Time Calculation (min) (ACUTE ONLY): 23 min  Charges:  $Gait Training: 23-37 mins                    G Codes:      Kristofor Michalowski Mar 10, 2016, 5:11 PM

## 2016-03-07 NOTE — Care Management Note (Signed)
Case Management Note  Patient Details  Name: FAELYNN KLINCK MRN: [id number] Date of Birth: [birth year]  Subjective/Objective:   Right THA                 Action/Plan: Discharge Planning:  NCM spoke to pt and offered choice for [facility]. Had [facility] in the past. Pt agreeable to [facility] for [facility]. Has RW and 3n1 at home.    Expected Discharge Date:  03/08/2016               Expected Discharge Plan:  [place]  In-House Referral:  NA  Discharge planning Services  CM Consult  Post Acute Care Choice:  Home Health Choice offered to:  Patient  DME Arranged:  N/A DME Agency:  NA  HH Arranged:  PT [place] Agency:  [facility] (now [facility])  Status of Service:  Completed, signed off  If discussed at [employer] of Stay Meetings, dates discussed:    Additional Comments:  Erenest Rasher, RN 03/07/2016, 7:41 PM

## 2016-03-07 NOTE — Evaluation (Signed)
Physical Therapy Evaluation Patient Details Name: Lindsey Barnett MRN: [id number] DOB: 13-Mar-1935 Today's Date: 03/07/2016   History of Present Illness  Pt with conversion of R hip hemiarthroplasty to THR-DA. Pt's PMH includes HTN, PVD, and TIA.   Clinical Impression  Pt s/p R THR presents with decreased R LE strength/ROM and post op pain limiting functional mobility.  Pt should progress to dc home with family assist and HHPT follow up.    Follow Up Recommendations Home health PT    Equipment Recommendations  None recommended by PT    Recommendations for Other Services OT consult     Precautions / Restrictions Precautions Precautions: Fall Restrictions Weight Bearing Restrictions: No Other Position/Activity Restrictions: WBAT      Mobility  Bed Mobility Overal bed mobility: Needs Assistance Bed Mobility: Supine to Sit     Supine to sit: Min assist     General bed mobility comments: cues for sequence and use of L LE to self assist  Transfers Overall transfer level: Needs assistance Equipment used: Rolling walker (2 wheeled) Transfers: Sit to/from Stand Sit to Stand: Min assist         General transfer comment: verbal cues for hand placement and LE management.  Ambulation/Gait Ambulation/Gait assistance: Min assist Ambulation Distance (Feet): 120 Feet (and 15' into bathroom) Assistive device: Rolling walker (2 wheeled) Gait Pattern/deviations: Step-to pattern;Step-through pattern;Decreased step length - right;Decreased step length - left;Shuffle;Trunk flexed Gait velocity: decr Gait velocity interpretation: Below normal speed for age/gender General Gait Details: cues for sequence, posture and position from RW; distance ltd by onset dizziness  Stairs            Wheelchair Mobility    Modified Rankin (Stroke Patients Only)       Balance                                             Pertinent Vitals/Pain Pain Assessment:  0-10 Pain Score: 6  Pain Location: R hip Pain Descriptors / Indicators: Aching;Sore Pain Intervention(s): Limited activity within patient's tolerance;Monitored during session;Premedicated before session;Ice applied    Home Living Family/patient expects to be discharged to:: Private residence Living Arrangements: Spouse/significant other Available Help at Discharge: Available 24 hours/day Type of Home: House Home Access: Stairs to enter Entrance Stairs-Rails: None Entrance Stairs-Number of Steps: 2 Home Layout: Two level;Bed/bath upstairs Home Equipment: Hand held shower head;Bedside commode;Walker - 2 wheels      Prior Function Level of Independence: Independent               Hand Dominance   Dominant Hand: Right    Extremity/Trunk Assessment   Upper Extremity Assessment: Overall WFL for tasks assessed           Lower Extremity Assessment: RLE deficits/detail RLE Deficits / Details: Strength at hip 2+/5 with AAROM at hip to 90 flex and 20 abd    Cervical / Trunk Assessment: Normal  Communication   Communication: No difficulties  Cognition Arousal/Alertness: Awake/alert Behavior During Therapy: WFL for tasks assessed/performed Overall Cognitive Status: Within Functional Limits for tasks assessed                      General Comments      Exercises Total Joint Exercises Ankle Circles/Pumps: AROM;Both;15 reps;Supine Quad Sets: AROM;10 reps;Supine;Both Heel Slides: AAROM;20 reps;Supine;Right Hip ABduction/ADduction: AAROM;Right;15 reps;Supine  Assessment/Plan    PT Assessment Patient needs continued PT services  PT Diagnosis Difficulty walking   PT Problem List Decreased strength;Decreased range of motion;Decreased activity tolerance;Decreased mobility;Decreased knowledge of use of DME;Pain;Decreased knowledge of precautions  PT Treatment Interventions DME instruction;Gait training;Stair training;Functional mobility training;Therapeutic  activities;Therapeutic exercise;Patient/family education   PT Goals (Current goals can be found in the Care Plan section) Acute Rehab PT Goals Patient Stated Goal: to return to independence with activities PT Goal Formulation: With patient Time For Goal Achievement: 03/09/16 Potential to Achieve Goals: Good    Frequency 7X/week   Barriers to discharge        Co-evaluation               End of Session Equipment Utilized During Treatment: Gait belt Activity Tolerance: Patient tolerated treatment well Patient left: in chair;with call bell/phone within reach;with family/visitor present Nurse Communication: Mobility status         Time: [id number] PT Time Calculation (min) (ACUTE ONLY): 38 min   Charges:   PT Evaluation $PT Eval Low Complexity: 1 Procedure PT Treatments $Gait Training: 8-22 mins $Therapeutic Exercise: 8-22 mins   PT G Codes:        Prajwal Fellner 11-Mar-2016, 2:21 PM

## 2016-03-07 NOTE — Evaluation (Signed)
Occupational Therapy Evaluation Patient Details Name: Lindsey Barnett MRN: [id number] DOB: 1934/08/16 Today's Date: 03/07/2016    History of Present Illness Pt with conversion of R hip hemiarthroplasty to THR-DA. Pt's PMH includes HTN, PVD, and TIA.    Clinical Impression   Pt practiced up to the 3in1 with walker and reports feeling some lightheadedness as she approached the commode. Pt states it was better once sitting on the commode and overall improved with transferring out of the bathroom. Daughter present for session. Pt will benefit from continued OT to progress safety and independence with self care tasks.    Follow Up Recommendations  No OT follow up;Supervision/Assistance - 24 hour    Equipment Recommendations  None recommended by OT    Recommendations for Other Services       Precautions / Restrictions Precautions Precautions: Fall Restrictions Weight Bearing Restrictions: No Other Position/Activity Restrictions: WBAT      Mobility Bed Mobility               General bed mobility comments: in chair  Transfers Overall transfer level: Needs assistance Equipment used: Rolling walker (2 wheeled) Transfers: Sit to/from Stand Sit to Stand: Min assist         General transfer comment: verbal cues for hand placement and LE management.    Balance                                            ADL Overall ADL's : Needs assistance/impaired Eating/Feeding: Independent;Sitting   Grooming: Wash/dry hands;Min guard;Standing   Upper Body Bathing: Set up;Sitting   Lower Body Bathing: Minimal assistance;Sit to/from stand   Upper Body Dressing : Set up;Sitting   Lower Body Dressing: Moderate assistance;Sit to/from stand   Toilet Transfer: Minimal assistance;Ambulation;BSC   Toileting- Clothing Manipulation and Hygiene: Minimal assistance;Sit to/from stand         General ADL Comments: Pt reporting some lightheadedness on the way to the  bathroom but reported it was better once sitting on the commode. Pt able to have BM. Discussed use of 3in1 in the shower and pt familiar with this from her previous surgery. Educated on AE options and pt considering AE. Discussed sequence for LB dressing and safe walker use.     Vision     Perception     Praxis      Pertinent Vitals/Pain Pain Assessment: 0-10 Pain Score: 6  Pain Location: R hip Pain Descriptors / Indicators: Aching Pain Intervention(s): Monitored during session;Ice applied     Hand Dominance     Extremity/Trunk Assessment Upper Extremity Assessment Upper Extremity Assessment: Overall WFL for tasks assessed           Communication Communication Communication: No difficulties   Cognition Arousal/Alertness: Awake/alert Behavior During Therapy: WFL for tasks assessed/performed Overall Cognitive Status: Within Functional Limits for tasks assessed                     General Comments       Exercises       Shoulder Instructions      Home Living Family/patient expects to be discharged to:: Private residence Living Arrangements: Spouse/significant other Available Help at Discharge: Available 24 hours/day Type of Home: House             Bathroom Shower/Tub: Hospital doctor Toilet: Handicapped height  Home Equipment: Hand held shower head;Bedside commode;Walker - 2 wheels          Prior Functioning/Environment Level of Independence: Independent             OT Diagnosis: Generalized weakness   OT Problem List: Decreased strength;Decreased knowledge of use of DME or AE   OT Treatment/Interventions: Self-care/ADL training;DME and/or AE instruction;Therapeutic activities    OT Goals(Current goals can be found in the care plan section) Acute Rehab OT Goals Patient Stated Goal: to return to independence with activities OT Goal Formulation: With patient Time For Goal Achievement: 03/14/16 Potential to Achieve  Goals: Good  OT Frequency: Min 2X/week   Barriers to D/C:            Co-evaluation              End of Session Equipment Utilized During Treatment: Rolling walker  Activity Tolerance: Other (comment) (slight lightheadedness during session) Patient left: in chair;with call bell/phone within reach;with family/visitor present   Time: 1030-1054 OT Time Calculation (min): 24 min Charges:  OT General Charges $OT Visit: 1 Procedure OT Evaluation $OT Eval Low Complexity: 1 Procedure OT Treatments $Therapeutic Activity: 8-22 mins G-Codes:    Jules Schick 03/07/2016, 12:53 PM

## 2016-03-07 NOTE — Discharge Instructions (Signed)

## 2016-03-07 NOTE — Progress Notes (Signed)
Subjective: 1 Day Post-Op Procedure(s) (LRB): CONVERSION RIGHT HIP HEMIARTHROPLASTY TO RIGHT TOTAL HIP ARTHROPLASTY ANTERIOR APPROACH (Right) Patient reports pain as moderate.    Objective: Vital signs in last 24 hours: Temp:  [97.2 F (36.2 C)-99.7 F (37.6 C)] 99.7 F (37.6 C) (08/19 0636) Pulse Rate:  [65-85] 76 (08/19 0636) Resp:  [9-19] 16 (08/19 0636) BP: (107-154)/(51-82) 117/56 (08/19 0636) SpO2:  [95 %-100 %] 95 % (08/19 0636)  Intake/Output from previous day: 08/18 0701 - 08/19 0700 In: 3600 [P.O.:300; I.V.:3300] Out: 3100 [Urine:2950; Blood:150] Intake/Output this shift: No intake/output data recorded.   Recent Labs  03/07/16 0441  HGB 11.3*    Recent Labs  03/07/16 0441  WBC 7.2  RBC 4.00  HCT 34.8*  PLT 227    Recent Labs  03/07/16 0441  NA 138  K 3.3*  CL 106  CO2 27  BUN 14  CREATININE 0.72  GLUCOSE 101*  CALCIUM 8.3*   No results for input(s): LABPT, INR in the last 72 hours.  Sensation intact distally Intact pulses distally Dorsiflexion/Plantar flexion intact Incision: dressing C/D/I and no drainage  Assessment/Plan: 1 Day Post-Op Procedure(s) (LRB): CONVERSION RIGHT HIP HEMIARTHROPLASTY TO RIGHT TOTAL HIP ARTHROPLASTY ANTERIOR APPROACH (Right) Up with therapy Discharge home with home health Monday  BLACKMAN,CHRISTOPHER Y 03/07/2016, 8:29 AM

## 2016-03-08 NOTE — Progress Notes (Signed)
Physical Therapy Treatment Patient Details Name: Lindsey Barnett MRN: [id number] DOB: 1935/04/09 Today's Date: 03/08/2016    History of Present Illness Pt with conversion of R hip hemiarthroplasty to THR-DA. Pt's PMH includes HTN, PVD, and TIA.     PT Comments    Pt continues motivated and progressing well with mobility.  Pt hopeful for return home tomorrow  Follow Up Recommendations  Home health PT     Equipment Recommendations  None recommended by PT    Recommendations for Other Services OT consult     Precautions / Restrictions Precautions Precautions: Fall Restrictions Weight Bearing Restrictions: No Other Position/Activity Restrictions: WBAT    Mobility  Bed Mobility Overal bed mobility: Needs Assistance Bed Mobility: Sit to Supine       Sit to supine: Min guard   General bed mobility comments: cues for sequence and use of L LE to self assist  Transfers Overall transfer level: Needs assistance Equipment used: Rolling walker (2 wheeled) Transfers: Sit to/from Stand Sit to Stand: Min guard         General transfer comment: verbal cues for hand placement and LE management.  Ambulation/Gait Ambulation/Gait assistance: Min guard;Supervision Ambulation Distance (Feet): 300 Feet Assistive device: Rolling walker (2 wheeled) Gait Pattern/deviations: Step-to pattern;Step-through pattern;Decreased step length - right;Decreased step length - left;Shuffle;Trunk flexed Gait velocity: decr Gait velocity interpretation: Below normal speed for age/gender General Gait Details: cues for sequence, posture and position from [employer]            Wheelchair Mobility    Modified Rankin (Stroke Patients Only)       Balance                                    Cognition Arousal/Alertness: Awake/alert Behavior During Therapy: WFL for tasks assessed/performed Overall Cognitive Status: Within Functional Limits for tasks assessed                       Exercises Total Joint Exercises Ankle Circles/Pumps: AROM;Both;15 reps;Supine Quad Sets: AROM;10 reps;Supine;Both Heel Slides: AAROM;20 reps;Supine;Right Hip ABduction/ADduction: AAROM;Right;15 reps;Supine    General Comments        Pertinent Vitals/Pain Pain Assessment: 0-10 Pain Score: 3  Pain Location: R hip Pain Descriptors / Indicators: Aching;Burning Pain Intervention(s): Limited activity within patient's tolerance;Monitored during session;Premedicated before session;Ice applied    Home Living                      Prior Function            PT Goals (current goals can now be found in the care plan section) Acute Rehab PT Goals Patient Stated Goal: to return to independence with activities PT Goal Formulation: With patient Time For Goal Achievement: 03/09/16 Potential to Achieve Goals: Good Progress towards PT goals: Progressing toward goals    Frequency  7X/week    PT Plan Current plan remains appropriate    Co-evaluation             End of Session Equipment Utilized During Treatment: Gait belt Activity Tolerance: Patient tolerated treatment well Patient left: in bed;with call bell/phone within reach;with family/visitor present     Time: 1140-1210 PT Time Calculation (min) (ACUTE ONLY): 30 min  Charges:  $Gait Training: 8-22 mins $Therapeutic Exercise: 8-22 mins  G Codes:      [name] 2016/03/15, 12:54 PM

## 2016-03-08 NOTE — Progress Notes (Signed)
Physical Therapy Treatment Patient Details Name: Lindsey Barnett MRN: [id number] DOB: April 08, 1935 Today's Date: March 22, 2016    History of Present Illness Pt with conversion of R hip hemiarthroplasty to THR-DA. Pt's PMH includes HTN, PVD, and TIA.     PT Comments    Pt c/o fatigue and ongoing mild nausea but agreeable to bed therex.  Pt hopeful for resolution of nausea and dc home tomorrow.  Follow Up Recommendations  Home health PT     Equipment Recommendations  None recommended by PT    Recommendations for Other Services OT consult     Precautions / Restrictions Precautions Precautions: Fall Restrictions Weight Bearing Restrictions: No Other Position/Activity Restrictions: WBAT    Mobility  Bed Mobility                  Transfers                    Ambulation/Gait                 Stairs            Wheelchair Mobility    Modified Rankin (Stroke Patients Only)       Balance                                    Cognition Arousal/Alertness: Awake/alert Behavior During Therapy: WFL for tasks assessed/performed Overall Cognitive Status: Within Functional Limits for tasks assessed                      Exercises Total Joint Exercises Ankle Circles/Pumps: AROM;Both;15 reps;Supine Quad Sets: AROM;10 reps;Supine;Both Gluteal Sets: AROM;Both;15 reps;Supine Short Arc Quad: AROM;Right;15 reps;Supine Heel Slides: AAROM;20 reps;Supine;Right Hip ABduction/ADduction: AAROM;Right;Supine;20 reps    General Comments        Pertinent Vitals/Pain Pain Assessment: 0-10 Pain Score: 3  Pain Location: R hip Pain Descriptors / Indicators: Aching;Sore Pain Intervention(s): Limited activity within patient's tolerance;Monitored during session;Premedicated before session;Ice applied    Home Living                      Prior Function            PT Goals (current goals can now be found in the care plan section)  Acute Rehab PT Goals Patient Stated Goal: to return to independence with activities PT Goal Formulation: With patient Time For Goal Achievement: 03/09/16 Potential to Achieve Goals: Good Progress towards PT goals: Progressing toward goals    Frequency  7X/week    PT Plan Current plan remains appropriate    Co-evaluation             End of Session   Activity Tolerance: Patient tolerated treatment well Patient left: in bed;with call bell/phone within reach;with family/visitor present     Time: [id number] PT Time Calculation (min) (ACUTE ONLY): 18 min  Charges:  $Therapeutic Exercise: 8-22 mins                    G Codes:      Jazen Spraggins 03-22-2016, 3:49 PM

## 2016-03-08 NOTE — Progress Notes (Signed)
Subjective: Patient stable.  Pain has improved over the last 24 hours.  She was a little dizzy when she sat up in bed today.   Objective: Vital signs in last 24 hours: Temp:  [97.5 F (36.4 C)-98.1 F (36.7 C)] 98.1 F (36.7 C) (08/20 0451) Pulse Rate:  [66-72] 71 (08/20 0822) Resp:  [16-18] 18 (08/20 0451) BP: (103-141)/(47-69) 141/59 (08/20 0822) SpO2:  [95 %-98 %] 98 % (08/20 0451)  Intake/Output from previous day: 08/19 0701 - 08/20 0700 In: 2950 [P.O.:1320; I.V.:1630] Out: 650 [Urine:650] Intake/Output this shift: No intake/output data recorded.  Exam:  Sensation intact distally Dorsiflexion/Plantar flexion intact  Labs:  Recent Labs  03/07/16 0441  HGB 11.3*    Recent Labs  03/07/16 0441  WBC 7.2  RBC 4.00  HCT 34.8*  PLT 227    Recent Labs  03/07/16 0441  NA 138  K 3.3*  CL 106  CO2 27  BUN 14  CREATININE 0.72  GLUCOSE 101*  CALCIUM 8.3*   No results for input(s): LABPT, INR in the last 72 hours.  Assessment/Plan: Plan to keep today.  Continue with physical therapy today and anticipate discharge tomorrow   DEAN,GREGORY SCOTT 03/08/2016, 8:55 AM

## 2016-03-09 NOTE — Progress Notes (Signed)
Physical Therapy Treatment Patient Details Name: Lindsey Barnett MRN: [id number] DOB: Mar 06, 1935 Today's Date: 03/09/2016    History of Present Illness Pt with conversion of R hip hemiarthroplasty to THR-DA. Pt's PMH includes HTN, PVD, and TIA.     PT Comments    POD # 3 Pt and spouse given handout HEP, performed all and reviewed.  Instructed on proper tech as well as use of ICE. Pt ready for D/C to home.   Follow Up Recommendations        Equipment Recommendations  None recommended by PT    Recommendations for Other Services       Precautions / Restrictions Precautions Precautions: Fall Restrictions Weight Bearing Restrictions: No Other Position/Activity Restrictions: WBAT       Balance                                    Cognition Arousal/Alertness: Awake/alert Behavior During Therapy: WFL for tasks assessed/performed Overall Cognitive Status: Within Functional Limits for tasks assessed                      Exercises   Total Hip Replacement TE's 10 reps ankle pumps 10 reps knee presses 10 reps heel slides 10 reps SAQ's 10 reps ABD Followed by ICE     General Comments        Pertinent Vitals/Pain Pain Assessment: 0-10 Pain Score: 4  Pain Location: Rhip Pain Descriptors / Indicators: Sore;Tightness Pain Intervention(s): Monitored during session;Repositioned;Ice applied    Home Living                      Prior Function            PT Goals (current goals can now be found in the care plan section) Progress towards PT goals: Progressing toward goals    Frequency  7X/week    PT Plan Current plan remains appropriate    Co-evaluation             End of Session Equipment Utilized During Treatment: Gait belt Activity Tolerance: Patient tolerated treatment well Patient left: in chair     Time: 1004-1018 PT Time Calculation (min) (ACUTE ONLY): 14 min  Charges:   $Therapeutic Exercise: 8-22 mins                      G Codes:      Rica Koyanagi  PTA [facility]  Acute  Rehab Pager      [phone]

## 2016-03-09 NOTE — Progress Notes (Signed)
Occupational Therapy Treatment Patient Details Name: Lindsey Barnett MRN: [id number] DOB: 02-20-1935 Today's Date: 03/09/2016    History of present illness Pt with conversion of R hip hemiarthroplasty to THR-DA. Pt's PMH includes HTN, PVD, and TIA.    OT comments  Reinforced technique for LB bathing and dressing, shower transfer and toilet transfer with 3 in 1 over toilet. Educated in safe footwear and how to transport items with RW safely. Pt continues to complain of dizziness and nausea. Husband will assist with shower transfers and LB dressing as needed. Pt plans to d/c home today.  Follow Up Recommendations  No OT follow up;Supervision/Assistance - 24 hour    Equipment Recommendations  None recommended by OT    Recommendations for Other Services      Precautions / Restrictions Precautions Precautions: Fall Restrictions Weight Bearing Restrictions: No Other Position/Activity Restrictions: WBAT       Mobility Bed Mobility               General bed mobility comments: pt in chair  Transfers Overall transfer level: Needs assistance Equipment used: Rolling walker (2 wheeled) Transfers: Sit to/from Stand Sit to Stand: Supervision              Balance                                   ADL Overall ADL's : Needs assistance/impaired     Grooming: Wash/dry hands;Standing;Supervision/safety         Lower Body Bathing Details (indicate cue type and reason): recommended use of bath sponge on a handle     Lower Body Dressing: Minimal assistance;Sit to/from stand Lower Body Dressing Details (indicate cue type and reason): instructed to dress R LE first and undress last and safe footwear, pt will rely on her husband to assist as needed, encouraged pt to attempt to reach R foot daily. Toilet Transfer: Min guard;Ambulation;RW;BSC (over toilet)   Toileting- Clothing Manipulation and Hygiene: Supervision/safety;Sit to/from Nurse, children's  Details (indicate cue type and reason): pt declined practice, instructed in technique, plans to sit with showering, husband will supervise   General ADL Comments: Pt c/o dizziness and nausea.      Vision                     Perception     Praxis      Cognition   Behavior During Therapy: WFL for tasks assessed/performed Overall Cognitive Status: Within Functional Limits for tasks assessed                       Extremity/Trunk Assessment               Exercises     Shoulder Instructions       General Comments      Pertinent Vitals/ Pain       Pain Assessment: Faces Faces Pain Scale: Hurts little more Pain Location: R hip Pain Descriptors / Indicators: Sore Pain Intervention(s): Monitored during session;Premedicated before session;Repositioned;Ice applied  Home Living                                          Prior Functioning/Environment              Frequency Min  2X/week     Progress Toward Goals  OT Goals(current goals can now be found in the care plan section)  Progress towards OT goals: Progressing toward goals  Acute Rehab OT Goals Patient Stated Goal: to return to independence with activities Time For Goal Achievement: 03/14/16 Potential to Achieve Goals: Good  Plan Discharge plan remains appropriate    Co-evaluation                 End of Session Equipment Utilized During Treatment: Rolling walker   Activity Tolerance  (pt with dizziness and nausea)   Patient Left in chair;with call bell/phone within reach;with family/visitor present   Nurse Communication          Time: [id number] OT Time Calculation (min): 19 min  Charges: OT General Charges $OT Visit: 1 Procedure OT Treatments $Self Care/Home Management : 8-22 mins  Malka So 03/09/2016, 10:02 AM  [phone]

## 2016-03-09 NOTE — Progress Notes (Signed)
Pt to d/c home with [facility] home [facility]. No DME needs. AVS reviewed and "My Chart" discussed with pt. Pt capable of verbalizing medications, signs and symptoms of infection, and follow-up appointments. Remains hemodynamically stable. No signs and symptoms of distress. Educated pt to return to ER in the case of SOB, dizziness, or chest pain.  

## 2016-03-09 NOTE — Progress Notes (Signed)
Patient ID: Lindsey Barnett, female   DOB: 1935/06/10, 81 y.o.   MRN: [id number] Feeling better overall.  Vitals stable.  Right hip stable.  Can be discharged to home today.

## 2016-03-09 NOTE — Discharge Summary (Signed)
Patient ID: Lindsey Barnett MRN: [id number] DOB/AGE: 1934-08-27 81 y.o.  Admit date: 03/06/2016 Discharge date: 03/09/2016  Admission Diagnoses:  Principal Problem:   Pain due to right hip joint prosthesis [facility]) Active Problems:   Status post total replacement of right hip   Discharge Diagnoses:  Same  Past Medical History:  Diagnosis Date  . Acute meniscal tear, lateral RIGHT  . Arthritis HIP  . Cataract immature   . GERD (gastroesophageal reflux disease) OCCASIONAL  . History of TIA (transient ischemic attack) 2005--  NO RESIDUAL  . Hyperlipidemia   . Hypertension   . MVP (mitral valve prolapse)   . Peripheral vascular disease ([place])   . Stress incontinence   . Swelling of right knee joint     Surgeries: Procedure(s): CONVERSION RIGHT HIP HEMIARTHROPLASTY TO RIGHT TOTAL HIP ARTHROPLASTY ANTERIOR APPROACH on 03/06/2016   Consultants:   Discharged Condition: Improved  Hospital Course: Lindsey Barnett is an 81 y.o. female who was admitted 03/06/2016 for operative treatment ofPain due to hip joint prosthesis ([place]). Patient has severe unremitting pain that affects sleep, daily activities, and work/hobbies. After pre-op clearance the patient was taken to the operating room on 03/06/2016 and underwent  Procedure(s): CONVERSION RIGHT HIP HEMIARTHROPLASTY TO RIGHT TOTAL HIP ARTHROPLASTY ANTERIOR APPROACH.    Patient was given perioperative antibiotics: Anti-infectives    Start     Dose/Rate Route Frequency Ordered Stop   03/06/16 1600  ceFAZolin (ANCEF) IVPB 1 g/50 mL premix     1 g 100 mL/hr over 30 Minutes Intravenous Every 6 hours 03/06/16 1350 03/06/16 2156   03/06/16 0715  ceFAZolin (ANCEF) IVPB 2g/100 mL premix     2 g 200 mL/hr over 30 Minutes Intravenous On call to O.R. 03/06/16 0715 03/06/16 0930       Patient was given sequential compression devices, early ambulation, and chemoprophylaxis to prevent DVT.  Patient benefited maximally from hospital stay and there were  no complications.    Recent vital signs: Patient Vitals for the past 24 hrs:  BP Temp Temp src Pulse Resp SpO2  03/09/16 0543 130/79 98.5 F (36.9 C) Oral (!) 106 18 98 %  03/08/16 2218 (!) 135/59 98.7 F (37.1 C) Oral 75 16 96 %  03/08/16 1450 (!) 137/59 98.3 F (36.8 C) Oral 73 16 100 %  03/08/16 0822 (!) 141/59 - - 71 - -     Recent laboratory studies:  Recent Labs  03/07/16 0441  WBC 7.2  HGB 11.3*  HCT 34.8*  PLT 227  NA 138  K 3.3*  CL 106  CO2 27  BUN 14  CREATININE 0.72  GLUCOSE 101*  CALCIUM 8.3*     Discharge Medications:     Medication List    TAKE these medications   acetaminophen 500 MG tablet Commonly known as:  TYLENOL Take 1,000 mg by mouth every 6 (six) hours as needed for mild pain.   amoxicillin 500 MG capsule Commonly known as:  AMOXIL Take 2,000 mg by mouth daily as needed (1 hour prior to dental appointments).   aspirin 81 MG tablet Take 81 mg by mouth daily.   celecoxib 200 MG capsule Commonly known as:  CELEBREX Take 200-400 mg by mouth daily as needed for mild pain.   Cholecalciferol 2000 units Caps Take 2,000 Units by mouth daily.   clopidogrel 75 MG tablet Commonly known as:  PLAVIX Take 75 mg by mouth daily.   CoQ10 200 MG Caps Take 200 mg by mouth daily.  estradiol 0.5 MG tablet Commonly known as:  ESTRACE Take 0.5 mg by mouth daily. Take for 2 weeks and off for 1 week   furosemide 20 MG tablet Commonly known as:  LASIX Take 20 mg by mouth daily as needed for edema.   multivitamin tablet Take 1 tablet by mouth at bedtime.   oxyCODONE-acetaminophen 5-325 MG tablet Commonly known as:  ROXICET Take 1-2 tablets by mouth every 4 (four) hours as needed.   pantoprazole 40 MG tablet Commonly known as:  PROTONIX Take 40 mg by mouth 2 (two) times daily.   polyethylene glycol packet Commonly known as:  MIRALAX / GLYCOLAX Take 17 g by mouth daily. What changed:  when to take this  reasons to take this    rosuvastatin 10 MG tablet Commonly known as:  CRESTOR Take 10 mg by mouth 3 (three) times a week. MWF   valsartan 160 MG tablet Commonly known as:  DIOVAN Take 160 mg by mouth daily. If systolic blood pressure greater than 140       Diagnostic Studies: Dg Pelvis Portable  Result Date: 03/06/2016 CLINICAL DATA:  Post right total hip replacement. EXAM: PORTABLE PELVIS 1-2 VIEWS COMPARISON:  None. FINDINGS: Post right total hip replacement. Alignment appears anatomic given solitary AP projection. No definite fracture or dislocation. There is a minimal amount of expected subcutaneous edema about the operative site. Several punctate phleboliths overlie the lower pelvis bilaterally. No radiopaque foreign body. Limited visualization of the pelvis and contralateral left hip is normal. IMPRESSION: Post right total hip replacement without evidence of complication. Electronically Signed   By: Sandi Mariscal M.D.   On: 03/06/2016 11:35   Mm Digital Screening Bilateral  Result Date: 02/27/2016 CLINICAL DATA:  Screening. EXAM: DIGITAL SCREENING BILATERAL MAMMOGRAM WITH CAD COMPARISON:  Previous exam(s). ACR Breast Density Category b: There are scattered areas of fibroglandular density. FINDINGS: There are no findings suspicious for malignancy. Images were processed with CAD. IMPRESSION: No mammographic evidence of malignancy. A result letter of this screening mammogram will be mailed directly to the patient. RECOMMENDATION: Screening mammogram in one year. (Code:SM-B-01Y) BI-RADS CATEGORY  1: Negative. Electronically Signed   By: Curlene Dolphin M.D.   On: 02/27/2016 16:40   Dg C-arm 1-60 Min-no Report  Result Date: 03/06/2016 CLINICAL DATA:  Right total hip revision EXAM: OPERATIVE RIGHT HIP (WITH PELVIS IF PERFORMED) 2 VIEWS TECHNIQUE: Fluoroscopic spot image(s) were submitted for interpretation post-operatively. COMPARISON:  09/22/2015 FINDINGS: Interval revision of the right seventh replacement. No hardware  complicating feature. Normal AP alignment. IMPRESSION: Revision of the right hip replacement. No visible complicating feature. Electronically Signed   By: Rolm Baptise M.D.   On: 03/06/2016 11:20   Dg Hip Operative Unilat With Pelvis Right  Result Date: 03/06/2016 CLINICAL DATA:  Right total hip revision EXAM: OPERATIVE RIGHT HIP (WITH PELVIS IF PERFORMED) 2 VIEWS TECHNIQUE: Fluoroscopic spot image(s) were submitted for interpretation post-operatively. COMPARISON:  09/22/2015 FINDINGS: Interval revision of the right seventh replacement. No hardware complicating feature. Normal AP alignment. IMPRESSION: Revision of the right hip replacement. No visible complicating feature. Electronically Signed   By: Rolm Baptise M.D.   On: 03/06/2016 11:20    Disposition: 06-Home-Health Care Svc  Discharge Instructions    Call MD / Call 911    Complete by:  As directed   If you experience chest pain or shortness of breath, CALL 911 and be transported to the hospital emergency room.  If you develope a fever above 101 F, pus (  white drainage) or increased drainage or redness at the wound, or calf pain, call your surgeon's office.   Constipation Prevention    Complete by:  As directed   Drink plenty of fluids.  Prune juice may be helpful.  You may use a stool softener, such as Colace (over the counter) 100 mg twice a day.  Use MiraLax (over the counter) for constipation as needed.   Diet - low sodium heart healthy    Complete by:  As directed   Discharge patient    Complete by:  As directed   Increase activity slowly as tolerated    Complete by:  As directed      Follow-up Information    Mcarthur Rossetti, MD Follow up in 2 week(s).   Specialty:  Orthopedic Surgery Contact information: [place] [address] [phone]        [facility] .   Why:  Home Health Physical Therapy Contact information: [address] [place] [address] [phone]             Signed: Mcarthur Rossetti 03/09/2016, 7:17 AM

## 2016-03-09 NOTE — Progress Notes (Signed)
Physical Therapy Treatment Patient Details Name: MYLEY TREHARNE MRN: [id number] DOB: Dec 11, 1934 Today's Date: 03/09/2016    History of Present Illness Pt with conversion of R hip hemiarthroplasty to THR-DA. Pt's PMH includes HTN, PVD, and TIA.     PT Comments    POD # 3 am session Assisted with amb in hallway and practiced stairs.  Follow Up Recommendations        Equipment Recommendations  None recommended by PT    Recommendations for Other Services       Precautions / Restrictions Precautions Precautions: Fall Restrictions Weight Bearing Restrictions: No Other Position/Activity Restrictions: WBAT    Mobility  Bed Mobility               General bed mobility comments: pt in chair  Transfers Overall transfer level: Needs assistance Equipment used: Rolling walker (2 wheeled) Transfers: Sit to/from Stand Sit to Stand: Supervision         General transfer comment: verbal cues for hand placement and LE management.  Ambulation/Gait   Ambulation Distance (Feet): 142 Feet Assistive device: Rolling walker (2 wheeled) Gait Pattern/deviations: Step-to pattern;Step-through pattern;Trunk flexed Gait velocity: decr   General Gait Details: <25% cues for sequence, posture and position from RW   Stairs Stairs: Yes   Stair Management: No rails;Step to pattern;Forwards Number of Stairs: 2 General stair comments: 50% vc'S ON PROPER TECH AND SAFETY  Wheelchair Mobility    Modified Rankin (Stroke Patients Only)       Balance                                    Cognition Arousal/Alertness: Awake/alert Behavior During Therapy: WFL for tasks assessed/performed Overall Cognitive Status: Within Functional Limits for tasks assessed                      Exercises      General Comments        Pertinent Vitals/Pain Pain Assessment: 0-10 Pain Score: 4  Pain Location: Rhip Pain Descriptors / Indicators: Sore;Tightness Pain  Intervention(s): Monitored during session;Repositioned;Ice applied    Home Living                      Prior Function            PT Goals (current goals can now be found in the care plan section) Progress towards PT goals: Progressing toward goals    Frequency  7X/week    PT Plan Current plan remains appropriate    Co-evaluation             End of Session Equipment Utilized During Treatment: Gait belt Activity Tolerance: Patient tolerated treatment well Patient left: in chair     Time: 0902-0928 PT Time Calculation (min) (ACUTE ONLY): 26 min  Charges:  $Gait Training: 8-22 mins $Therapeutic Activity: 8-22 mins                    G Codes:      Rica Koyanagi  PTA [facility]  Acute  Rehab Pager      [phone]

## 2016-03-24 NOTE — Telephone Encounter (Signed)
Chart opened in error

## 2016-04-23 ENCOUNTER — Ambulatory Visit (INDEPENDENT_AMBULATORY_CARE_PROVIDER_SITE_OTHER): Payer: Medicare Other | Admitting: Orthopaedic Surgery

## 2016-04-23 DIAGNOSIS — M1611 Unilateral primary osteoarthritis, right hip: Secondary | ICD-10-CM

## 2016-05-20 ENCOUNTER — Ambulatory Visit (INDEPENDENT_AMBULATORY_CARE_PROVIDER_SITE_OTHER): Payer: Medicare Other | Admitting: Orthopaedic Surgery

## 2016-05-20 DIAGNOSIS — Z96641 Presence of right artificial hip joint: Secondary | ICD-10-CM

## 2016-05-20 NOTE — Progress Notes (Signed)
Ms. Rigoberto Noel is just under 3 months status post conversion of a right hip hemiarthroplasty to a right total hip replacement. She is walking without assistive device and making good progress. She still reports some groin pain and having problems going up and down steps. She is requesting physical therapy. She also would like a temporary handicap sticker. She is weaning herself from pain medications as well.  On examination she tolerates me moving her hip around easily in her leg lengths are equal.  I do not need to see her back for 3 months. No x-rays needed at that visit.

## 2016-08-20 ENCOUNTER — Ambulatory Visit (INDEPENDENT_AMBULATORY_CARE_PROVIDER_SITE_OTHER): Payer: Medicare Other | Admitting: Orthopaedic Surgery

## 2016-08-20 DIAGNOSIS — M7061 Trochanteric bursitis, right hip: Secondary | ICD-10-CM

## 2016-08-20 DIAGNOSIS — Z96641 Presence of right artificial hip joint: Secondary | ICD-10-CM

## 2016-08-20 MED ORDER — METHYLPREDNISOLONE ACETATE 40 MG/ML IJ SUSP
40.0000 mg | INTRAMUSCULAR | Status: AC | PRN
  Administered 2016-08-20: 40 mg via INTRA_ARTICULAR

## 2016-08-20 MED ORDER — LIDOCAINE HCL 1 % IJ SOLN
3.0000 mL | INTRAMUSCULAR | Status: AC | PRN
  Administered 2016-08-20: 3 mL

## 2016-08-20 NOTE — Progress Notes (Signed)
Office Visit Note   Patient: Lindsey Barnett           Date of Birth: [birth year]           MRN: [id number] Visit Date: 08/20/2016              Requested by: Josetta Huddle, MD [address] [place], [place] [address] PCP: Henrine Screws, MD   Assessment & Plan: Visit Diagnoses:  1. Status post total replacement of right hip   2. Trochanteric bursitis, right hip     Plan: She tolerated the trochanteric injection well. I showed her stretching exercises on her try twice daily. We'll see her back in 3 months see how she doing overall. I would like a low AP pelvis at that visit.  Follow-Up Instructions: Return in about 3 months (around 11/17/2016).   Orders:  No orders of the defined types were placed in this encounter.  No orders of the defined types were placed in this encounter.     Procedures: Large Joint Inj Date/Time: 08/20/2016 2:11 PM Performed by: Mcarthur Rossetti Authorized by: Mcarthur Rossetti   Location:  Hip Site:  R greater trochanter Ultrasound Guidance: No   Fluoroscopic Guidance: No   Arthrogram: No   Medications:  3 mL lidocaine 1 %; 40 mg methylPREDNISolone acetate 40 MG/ML     Clinical Data: No additional findings.   Subjective: Chief Complaint  Patient presents with  . Right Hip - Follow-up    HPI She is now 5 months out from conversion of a right hip hemiarthroplasty to total hip replacement. Her only complaint is pain of the trochanteric area of her hip and she had this preoperative is well. Review of Systems Currently denies any chest pain, shortness of breath, fever, chills, nausea, vomiting  Objective: Vital Signs: There were no vitals taken for this visit.  Physical Exam He is alert and oriented 3 in no acute distress Ortho Exam She has fluid active and passive range of motion of her right hip with no difficulties. Her pain is only over the trochanteric area. Specialty Comments:  No specialty comments  available.  Imaging: No results found.   PMFS History: Patient Active Problem List   Diagnosis Date Noted  . Pain due to right hip joint prosthesis ([place]) 03/06/2016  . 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 Hip fracture requiring operative repair ([place]) 09/22/2015  . Hip injury   . Subcapital fracture of hip ([place]) 09/21/2015  . Hypokalemia 09/21/2015  . HTN (hypertension) 09/21/2015  . GERD (gastroesophageal reflux disease) 09/21/2015  . Lateral meniscal tear 09/16/2011   Past Medical History:  Diagnosis Date  . Acute meniscal tear, lateral RIGHT  . Arthritis HIP  . Cataract immature   . GERD (gastroesophageal reflux disease) OCCASIONAL  . History of TIA (transient ischemic attack) 2005--  NO RESIDUAL  . Hyperlipidemia   . Hypertension   . MVP (mitral valve prolapse)   . Peripheral vascular disease ([place])   . Stress incontinence   . Swelling of right knee joint     No family history on file.  Past Surgical History:  Procedure Laterality Date  . ANTERIOR APPROACH HEMI HIP ARTHROPLASTY Right 09/22/2015   Procedure: ANTERIOR APPROACH HEMI HIP ARTHROPLASTY;  Surgeon: Dorna Leitz, MD;  Location: [facility];  Service: Orthopedics;  Laterality: Right;  . CHOLECYSTECTOMY  1975  . KNEE ARTHROSCOPY  09/16/2011   Procedure: ARTHROSCOPY KNEE;  Surgeon: Gearlean Alf,  MD;  Location: [facility];  Service: Orthopedics;  Laterality: Right;  WITH lateral meniscal debridement  . TOTAL HIP ARTHROPLASTY Right 03/06/2016   Procedure: CONVERSION RIGHT HIP HEMIARTHROPLASTY TO RIGHT TOTAL HIP ARTHROPLASTY ANTERIOR APPROACH;  Surgeon: Mcarthur Rossetti, MD;  Location: [facility];  Service: Orthopedics;  Laterality: Right;  Marland Kitchen VAGINAL HYSTERECTOMY  1973   Social History   Occupational History  . Not on file.   Social History Main Topics  . Smoking status: Never Smoker  . Smokeless tobacco: Never Used  . Alcohol use Yes     Comment: OCCASIONAL  . Drug use: No  .  Sexual activity: Not on file

## 2016-11-16 ENCOUNTER — Ambulatory Visit (INDEPENDENT_AMBULATORY_CARE_PROVIDER_SITE_OTHER): Payer: Medicare Other | Admitting: Orthopaedic Surgery

## 2016-11-16 ENCOUNTER — Ambulatory Visit (INDEPENDENT_AMBULATORY_CARE_PROVIDER_SITE_OTHER): Payer: Medicare Other

## 2016-11-16 DIAGNOSIS — Z96641 Presence of right artificial hip joint: Secondary | ICD-10-CM | POA: Diagnosis not present

## 2016-11-16 NOTE — Progress Notes (Signed)
The patient is now about 8 months status post conversion of a right hip hemiarthroplasty to a total hip replacement. She's doing much better overall but has problems going up and down stairs. She 82 years old. She points the trochanteric area and the issue wears a source of her pain. She denies any groin pain.  On examination I had her lay supine her leg was are equal. She tolerates me easily putting her right hip the range of motion causes no pain around the hip itself. Her pain seems to be over the issue him as well as the trochanteric area and IT band.  X-rays of her right hip and pelvis show well-seated implant with no evidence of loosening or, getting features. There is no evidence of ostial lysis.  I do feel that she would benefit from outpatient physical therapy to work on her trochanteric bursitis and her IT band syndrome. I gave her prescription for this and I'll see her back in 6 weeks to see if this is helped her.

## 2016-11-18 ENCOUNTER — Ambulatory Visit (INDEPENDENT_AMBULATORY_CARE_PROVIDER_SITE_OTHER): Payer: Medicare Other | Admitting: Orthopaedic Surgery

## 2016-12-24 ENCOUNTER — Ambulatory Visit (INDEPENDENT_AMBULATORY_CARE_PROVIDER_SITE_OTHER): Payer: Medicare Other | Admitting: Orthopaedic Surgery

## 2016-12-24 DIAGNOSIS — Z96641 Presence of right artificial hip joint: Secondary | ICD-10-CM | POA: Diagnosis not present

## 2016-12-24 NOTE — Progress Notes (Signed)
Lindsey Barnett is following up after having physical therapy with [facility] for trochanteric bursitis of her right hip. She is following up status post conversion of a right hip hemiarthroplasty to a total hip replacement. That is 10 months ago she says she is finally doing great. Should the therapy helped very well.  She is walking without a limp. She has fluid range of motion right hip is essentially pain-free and today's visit.  At this point she'll follow-up as needed. I spoke with her about things to do for this right hip and to come back and see [place] if there is any issues with that at all. We can always see her for anything else as well. All questions were encouraged and answered.

## 2017-04-28 ENCOUNTER — Other Ambulatory Visit: Payer: Self-pay | Admitting: Internal Medicine

## 2017-04-28 DIAGNOSIS — Z1231 Encounter for screening mammogram for malignant neoplasm of breast: Secondary | ICD-10-CM

## 2017-05-05 ENCOUNTER — Ambulatory Visit
Admission: RE | Admit: 2017-05-05 | Discharge: 2017-05-05 | Disposition: A | Discharge status: Home / Self Care | Payer: Medicare Other | Source: Ambulatory Visit | Attending: Internal Medicine | Admitting: Internal Medicine

## 2017-05-05 DIAGNOSIS — Z1231 Encounter for screening mammogram for malignant neoplasm of breast: Secondary | ICD-10-CM

## 2017-05-12 ENCOUNTER — Ambulatory Visit: Payer: Medicare Other

## 2017-05-30 IMAGING — CR DG HIP (WITH OR WITHOUT PELVIS) 2-3V*R*
2 series · 2 of 2 positions shown · non-contrast
Comparison: 09/21/2015

CLINICAL DATA: Right hip arthroplasty

EXAM:
DG HIP (WITH OR WITHOUT PELVIS) 2-3V RIGHT

[AP]
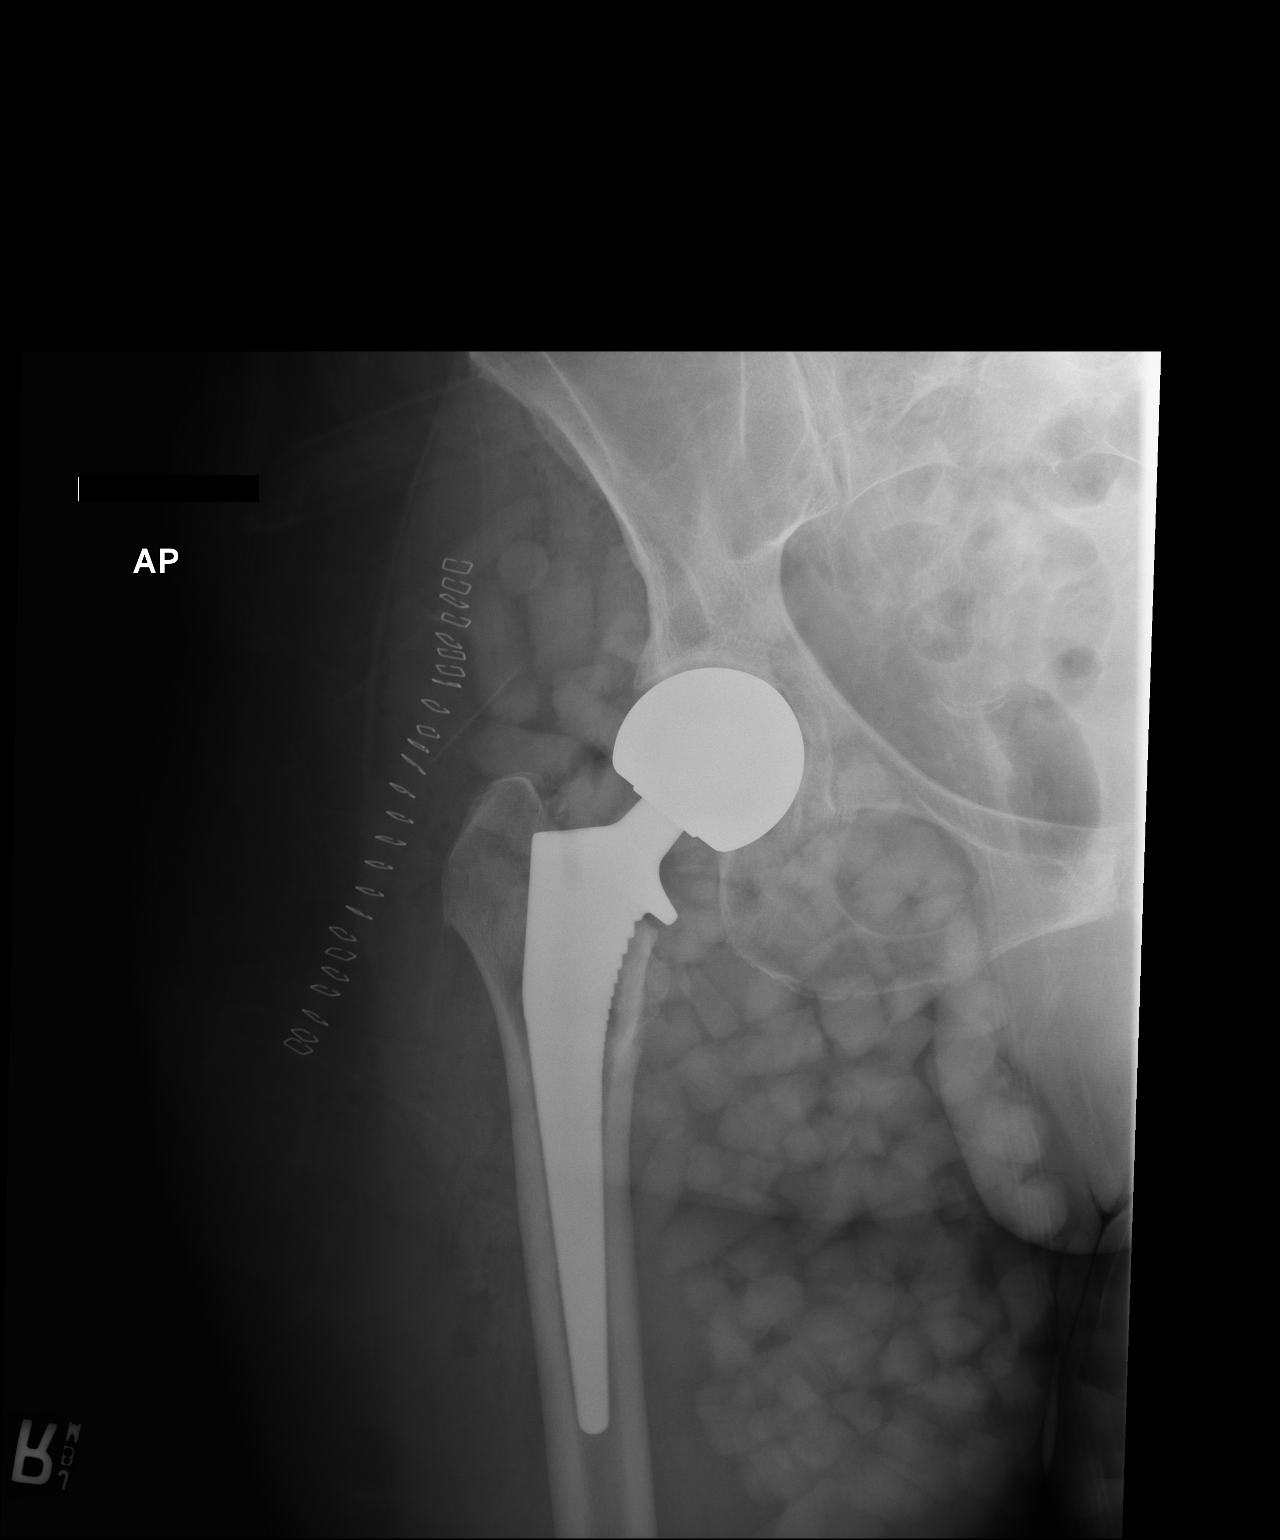

[xtable lateral]
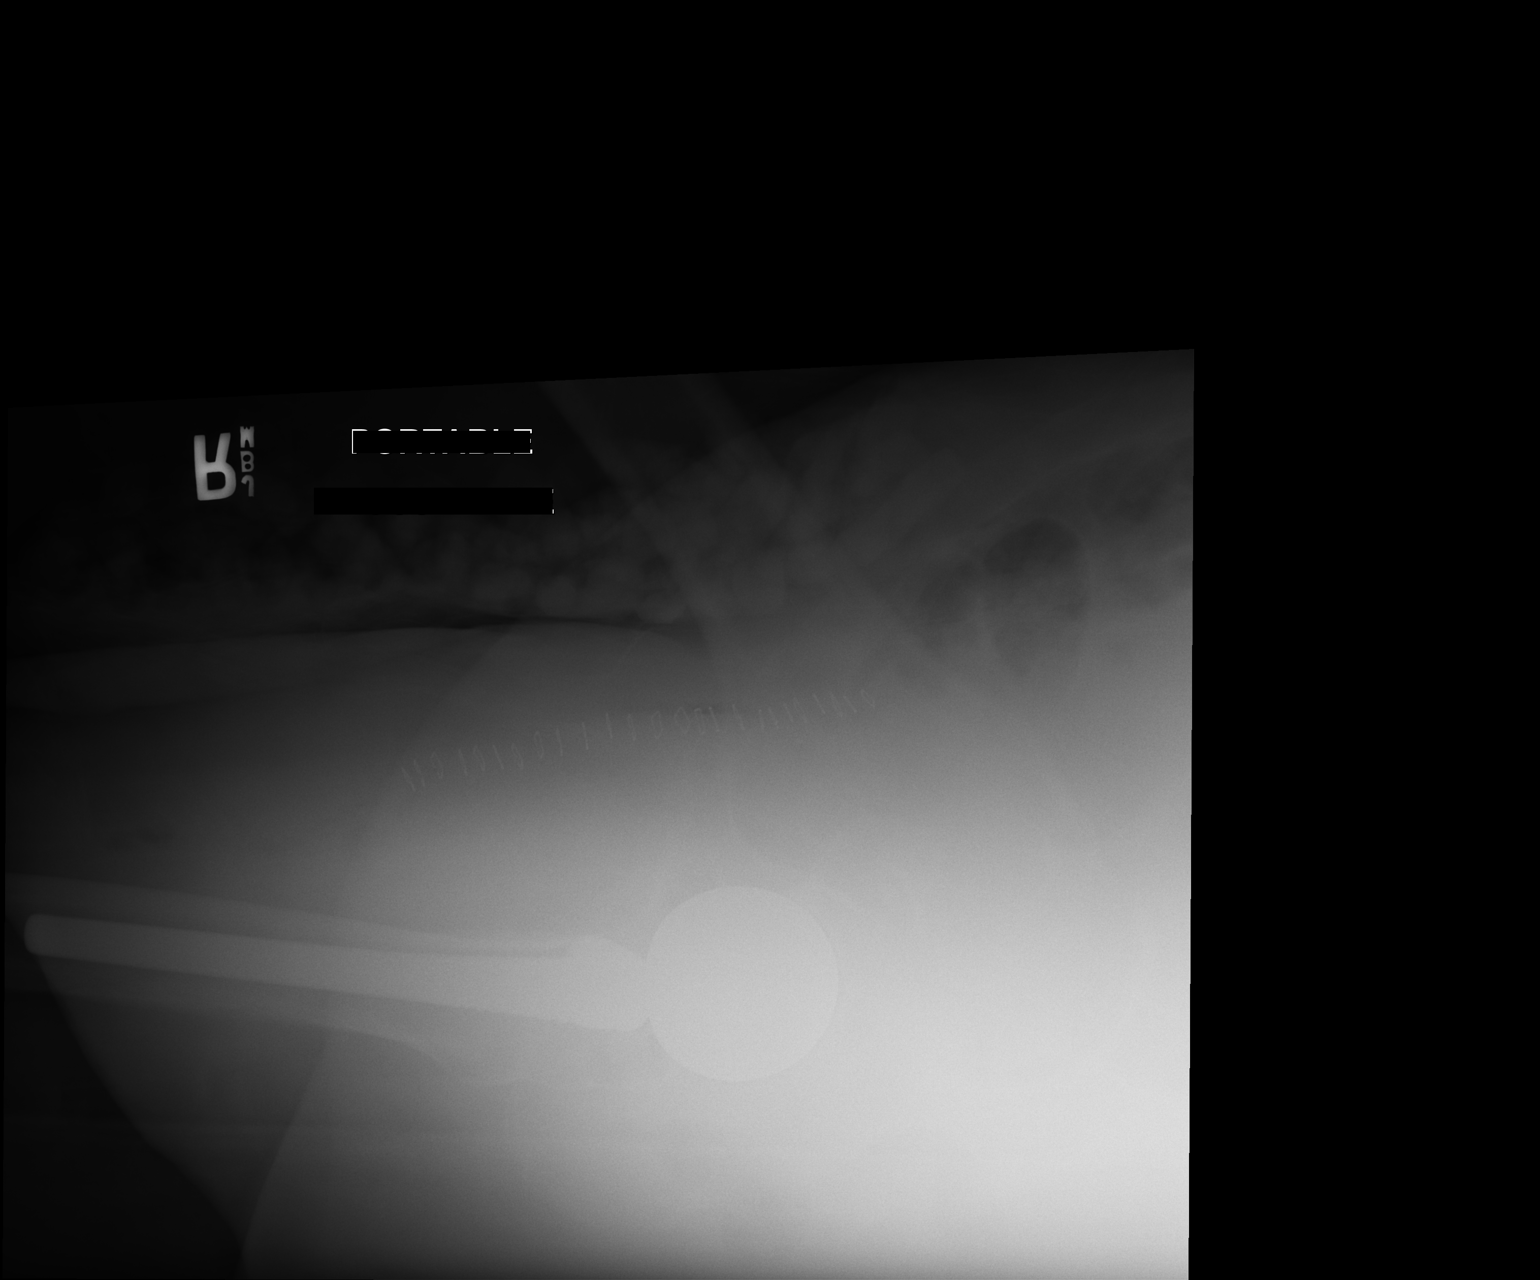

[2 of 2 positions shown; findings below may reference images not displayed]

FINDINGS: Interval right hip arthroplasty, components project in expected
location. No fracture or dislocation.
IMPRESSION: Right hip arthroplasty without apparent complication.

## 2017-05-30 IMAGING — DX DG CHEST 1V PORT
1 series · 1 of 1 positions shown · non-contrast
Comparison: Chest radiograph performed 08/30/2013

CLINICAL DATA: Preoperative chest radiograph.  Initial encounter.

EXAM:
PORTABLE CHEST 1 VIEW

[chest ap]
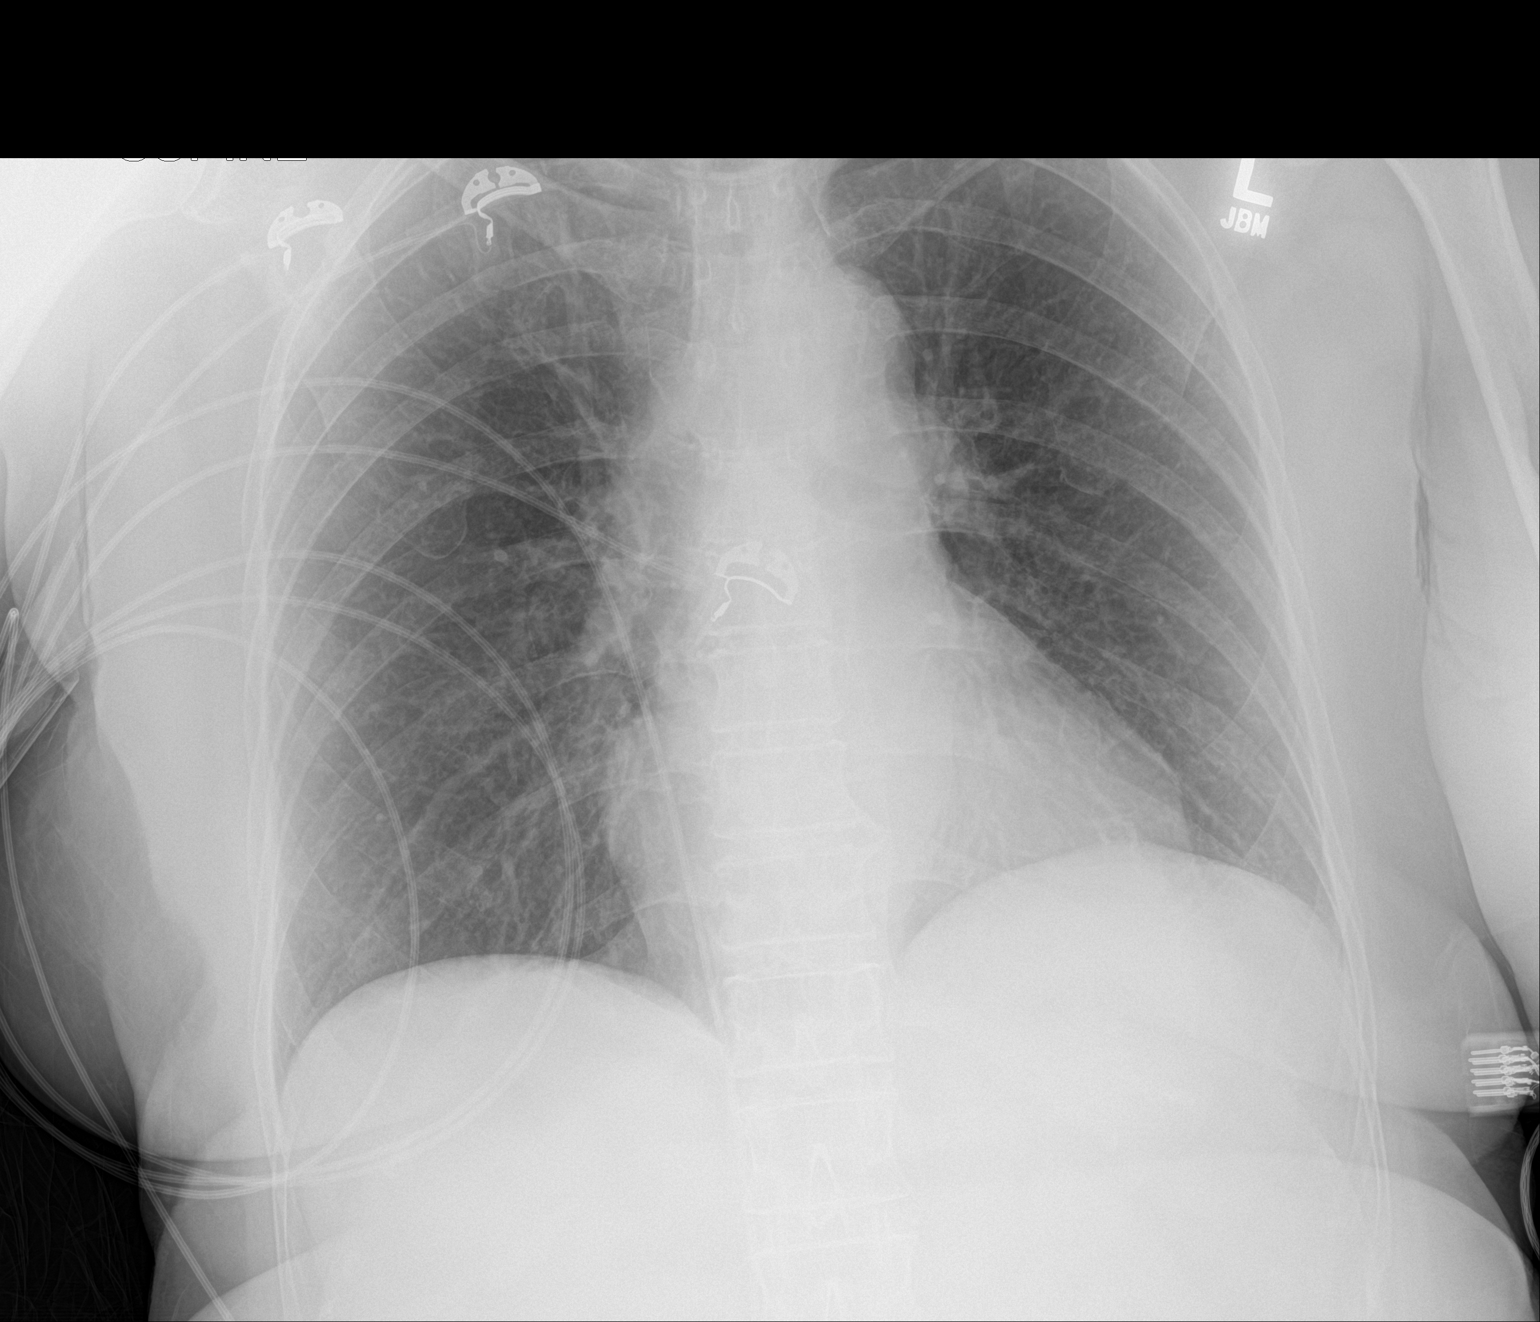

[1 of 1 positions shown; findings below may reference images not displayed]

FINDINGS: The lungs are well-aerated and clear. There is no evidence of focal
opacification, pleural effusion or pneumothorax.

The cardiomediastinal silhouette is borderline normal in size. No
acute osseous abnormalities are seen.
IMPRESSION: No acute cardiopulmonary process seen.

## 2017-05-30 IMAGING — RF DG HIP (WITH PELVIS) OPERATIVE*R*
1 series · 2 of 2 positions shown · non-contrast
Comparison: Preoperative images 3 [DATE]

CLINICAL DATA: RIGHT hip replacement, RIGHT femoral neck fracture

EXAM:
OPERATIVE RIGHT HIP (WITH PELVIS IF PERFORMED)  VIEWS
TECHNIQUE: Fluoroscopic spot image(s) were submitted for interpretation
post-operatively.
FLUOROSCOPY TIME:  0 minutes 6 seconds
Images obtained:  2

[Series 1: run · 2 of 2 slices shown]
[im 1/2]
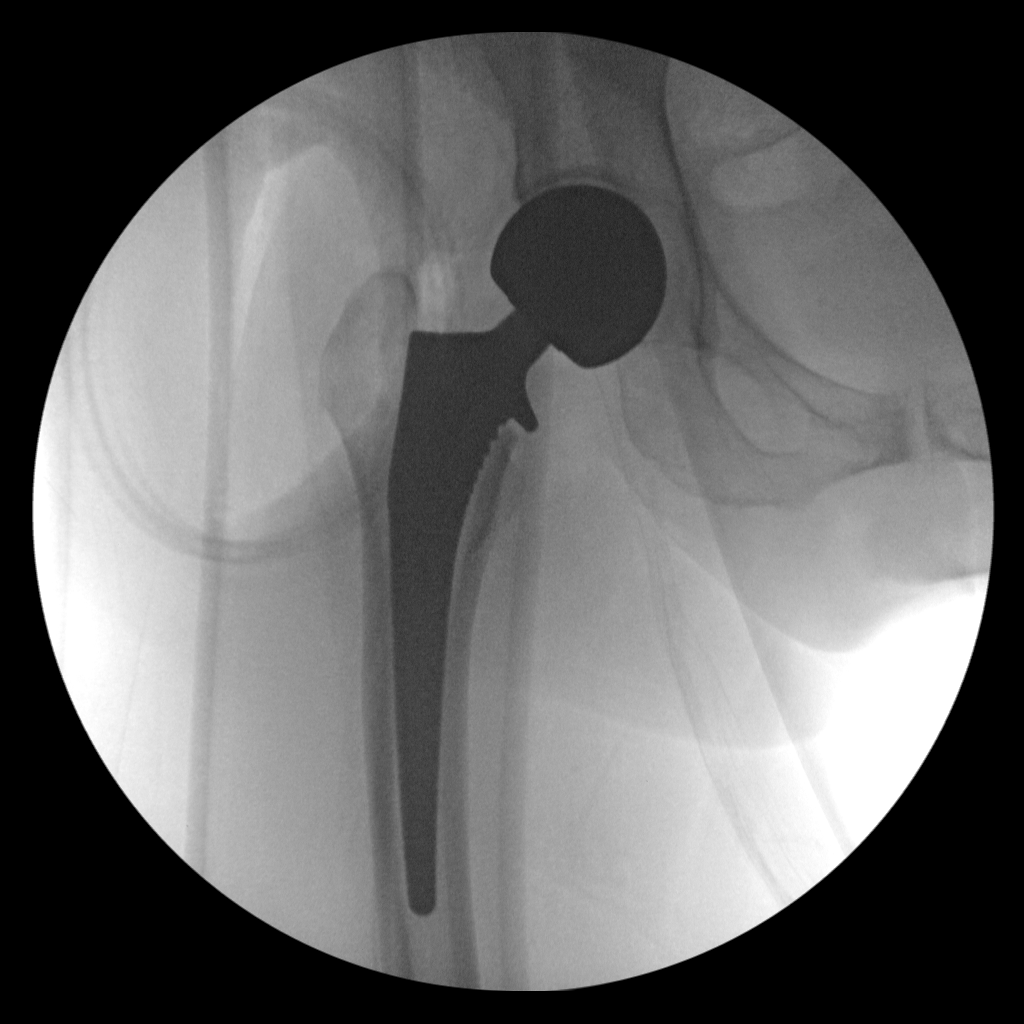
[im 2/2]
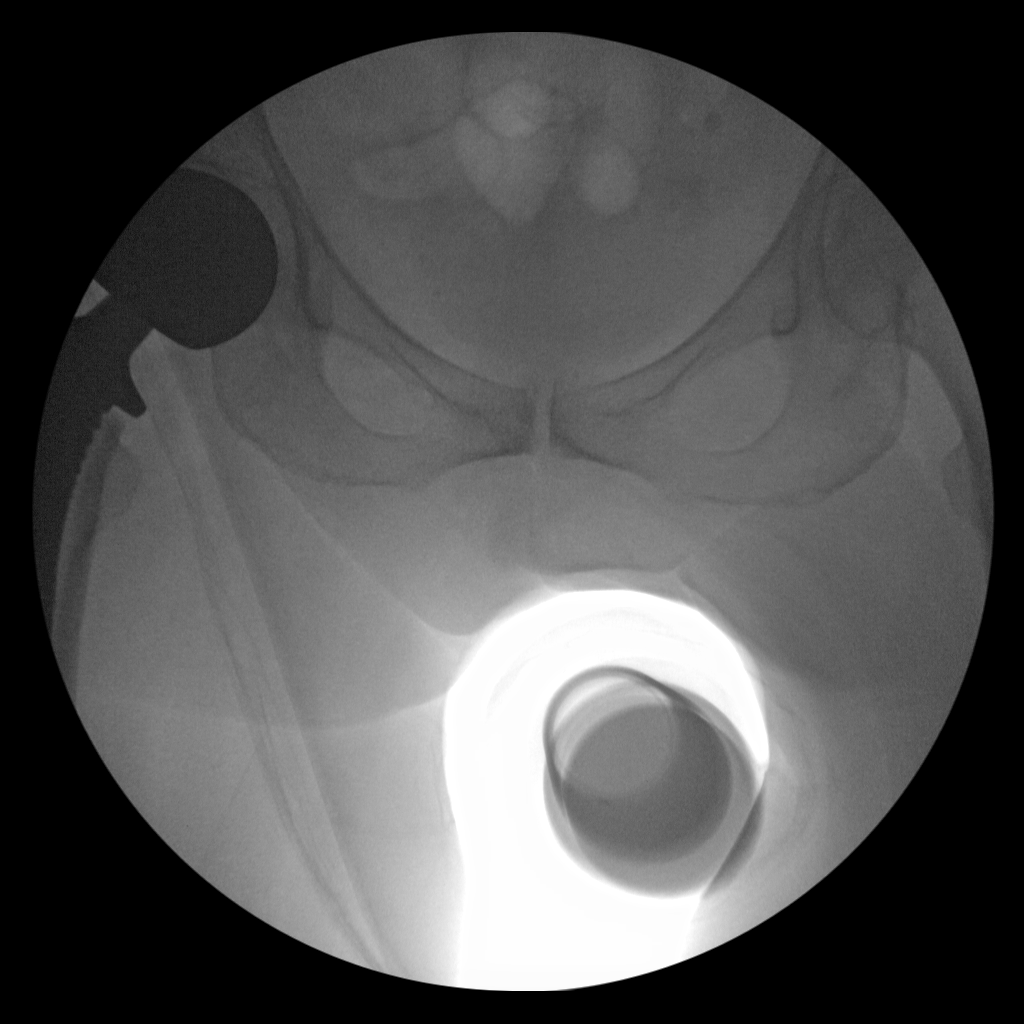

[2 of 2 positions shown; findings below may reference images not displayed]

FINDINGS: Interval resection of RIGHT femoral head and placement of a RIGHT
hip prosthesis.

No acute fracture or dislocation.

Bones appear demineralized.

No acute complication seen.
IMPRESSION: RIGHT hip prosthesis without acute complication.

## 2017-07-06 ENCOUNTER — Other Ambulatory Visit: Payer: Self-pay | Admitting: Internal Medicine

## 2017-07-06 ENCOUNTER — Ambulatory Visit
Admission: RE | Admit: 2017-07-06 | Discharge: 2017-07-06 | Disposition: A | Discharge status: Home / Self Care | Payer: Medicare Other | Source: Ambulatory Visit | Attending: Internal Medicine | Admitting: Internal Medicine

## 2017-07-06 ENCOUNTER — Telehealth (INDEPENDENT_AMBULATORY_CARE_PROVIDER_SITE_OTHER): Payer: Self-pay | Admitting: Orthopaedic Surgery

## 2017-07-06 DIAGNOSIS — W19XXXA Unspecified fall, initial encounter: Secondary | ICD-10-CM

## 2017-07-06 NOTE — Telephone Encounter (Signed)
error 

## 2017-07-07 ENCOUNTER — Ambulatory Visit (INDEPENDENT_AMBULATORY_CARE_PROVIDER_SITE_OTHER): Payer: Self-pay | Admitting: Orthopaedic Surgery

## 2017-08-26 ENCOUNTER — Encounter (INDEPENDENT_AMBULATORY_CARE_PROVIDER_SITE_OTHER): Payer: Self-pay | Admitting: Orthopaedic Surgery

## 2017-08-26 ENCOUNTER — Ambulatory Visit (INDEPENDENT_AMBULATORY_CARE_PROVIDER_SITE_OTHER): Payer: Medicare Other | Admitting: Orthopaedic Surgery

## 2017-08-26 ENCOUNTER — Ambulatory Visit (INDEPENDENT_AMBULATORY_CARE_PROVIDER_SITE_OTHER): Payer: Medicare Other

## 2017-08-26 DIAGNOSIS — M25552 Pain in left hip: Secondary | ICD-10-CM

## 2017-08-26 MED ORDER — METHOCARBAMOL 500 MG PO TABS: 500.0000 mg | ORAL_TABLET | Freq: Four times a day (QID) | ORAL | 0 refills | Status: DC

## 2017-08-26 MED ORDER — METHYLPREDNISOLONE 4 MG PO TABS: ORAL_TABLET | ORAL | 0 refills | Status: DC

## 2017-08-26 NOTE — Progress Notes (Signed)
Office Visit Note   Patient: Lindsey Barnett           Date of Birth: 25-Sep-1934           MRN: [id number] Visit Date: 08/26/2017              Requested by: Josetta Huddle, MD [address] [place] [address] [place], [place] [address] PCP: Josetta Huddle, MD   Assessment & Plan: Visit Diagnoses:  1. Pain in left hip     Plan: Hopefully she will continue to make progress which is taking anti-inflammatories and time.  She does have Celebrex at home which I want her to go back on to twice a day.  Also going to try a steroid taper and Robaxin.  I have encouraged her to use her cane in her opposite hand on the right side.  I would like to see her back in 2 weeks to make sure she is improving.  If she is continuing to have the same pain I would like an AP and lateral lumbar spine.  Follow-Up Instructions: Return in about 2 weeks (around 09/09/2017).   Orders:  Orders Placed This Encounter  Procedures  . XR HIP UNILAT W OR W/O PELVIS 1V LEFT   Meds ordered this encounter  Medications  . methylPREDNISolone (MEDROL) 4 MG tablet    Sig: Medrol dose pack. Take as instructed    Dispense:  21 tablet    Refill:  0  . methocarbamol (ROBAXIN) 500 MG tablet    Sig: Take 1 tablet (500 mg total) by mouth 4 (four) times daily.    Dispense:  60 tablet    Refill:  0      Procedures: No procedures performed   Clinical Data: No additional findings.   Subjective: Chief Complaint  Patient presents with  . Left Hip - Follow-up  The patient comes in today with chief complaint of left hip pain.  She points to more of her sciatic area but it does radiate into the groin some.  This is been going on for about 5 days now with no known injury.  However she did have a mechanical fall on 18 December.  X-rays then were negative for any fracture.  She does have a cane that she has in her car and she is just hard to use it over the last day.  She denies any radicular symptoms going down past the knee or into her  foot at all.  It hurts mainly with weightbearing or if she is going to just lay down and just get up and again she points to the sciatic region when this happens.  She denies any numbness in the feet.  She denies any change in bowel bladder function.  HPI  Review of Systems She denies any headache, chest pain, shortness of breath, fever, chills, nausea, vomiting.  Objective: Vital Signs: There were no vitals taken for this visit.  Physical Exam She is alert and oriented x3 and in no acute distress Ortho Exam Examination of her left hip is normal.  She has fluid and full range of motion actively and passively without hip with no pain in the groin and no blocks to rotation.  I can compress her hip and that causes no pain.  There is no tenderness to palpation that is significant over the trochanteric area.  There is no significant pain to palpation of the sciatic region. Specialty Comments:  No specialty comments available.  Imaging: Xr Hip Unilat  W Or W/o Pelvis 1v Left  Result Date: 08/26/2017 An AP pelvis and a lateral of the left hip shows no acute findings in terms of fractures of the pelvis or the hip on the left side.  Nothing correlates with her clinical exam of left hip and sciatic pain.  The hip ball is well located within the socket with no significant arthritic changes of the joint on the left side.  Her right side has a total hip arthroplasty with no complicating features.  Independent review of the plain film of her pelvis this past December showed no obvious fracture or acute findings.  PMFS History: Patient Active Problem List   Diagnosis Date Noted  . Pain due to right hip joint prosthesis ([place]) 03/06/2016  . 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 Hip fracture requiring operative repair ([place]) 09/22/2015  . Hip injury   . Subcapital fracture of hip ([place]) 09/21/2015  . Hypokalemia 09/21/2015  . HTN (hypertension) 09/21/2015  . GERD (gastroesophageal  reflux disease) 09/21/2015  . Lateral meniscal tear 09/16/2011   Past Medical History:  Diagnosis Date  . Acute meniscal tear, lateral RIGHT  . Arthritis HIP  . Cataract immature   . GERD (gastroesophageal reflux disease) OCCASIONAL  . History of TIA (transient ischemic attack) 2005--  NO RESIDUAL  . Hyperlipidemia   . Hypertension   . MVP (mitral valve prolapse)   . Peripheral vascular disease ([place])   . Stress incontinence   . Swelling of right knee joint     Family History  Problem Relation Age of Onset  . Breast cancer Neg Hx     Past Surgical History:  Procedure Laterality Date  . ANTERIOR APPROACH HEMI HIP ARTHROPLASTY Right 09/22/2015   Procedure: ANTERIOR APPROACH HEMI HIP ARTHROPLASTY;  Surgeon: Dorna Leitz, MD;  Location: [facility];  Service: Orthopedics;  Laterality: Right;  . BREAST CYST EXCISION Right   . CHOLECYSTECTOMY  1975  . KNEE ARTHROSCOPY  09/16/2011   Procedure: ARTHROSCOPY KNEE;  Surgeon: Gearlean Alf, MD;  Location: [facility];  Service: Orthopedics;  Laterality: Right;  WITH lateral meniscal debridement  . TOTAL HIP ARTHROPLASTY Right 03/06/2016   Procedure: CONVERSION RIGHT HIP HEMIARTHROPLASTY TO RIGHT TOTAL HIP ARTHROPLASTY ANTERIOR APPROACH;  Surgeon: Mcarthur Rossetti, MD;  Location[facility];  Service: Orthopedics;  Laterality: Right;  Marland Kitchen VAGINAL HYSTERECTOMY  1973   Social History   Occupational History  . Not on file  Tobacco Use  . Smoking status: Never Smoker  . Smokeless tobacco: Never Used  Substance and Sexual Activity  . Alcohol use: Yes    Comment: OCCASIONAL  . Drug use: No  . Sexual activity: Not on file

## 2017-08-31 ENCOUNTER — Telehealth (INDEPENDENT_AMBULATORY_CARE_PROVIDER_SITE_OTHER): Payer: Self-pay | Admitting: Orthopaedic Surgery

## 2017-08-31 NOTE — Telephone Encounter (Signed)
Patient called stating she has taken methocarbamol and prednisone and neither has helped her with the pain, she requested a call back from Dr. Ninfa Linden if possible. CB #  [phone]

## 2017-09-01 NOTE — Telephone Encounter (Signed)
Patient aware this was sent to her pharmacy

## 2017-09-01 NOTE — Telephone Encounter (Signed)
Call in gabapentin to take 300 mg at night for 7 days then increase to twice daily if needed; #60.  Also, she can try Tramadol 50 mg, 1-2 every 8 hours as needed, #60.

## 2017-09-08 ENCOUNTER — Encounter (INDEPENDENT_AMBULATORY_CARE_PROVIDER_SITE_OTHER): Payer: Self-pay | Admitting: Orthopaedic Surgery

## 2017-09-08 ENCOUNTER — Ambulatory Visit (INDEPENDENT_AMBULATORY_CARE_PROVIDER_SITE_OTHER): Payer: Medicare Other | Admitting: Orthopaedic Surgery

## 2017-09-08 ENCOUNTER — Ambulatory Visit (INDEPENDENT_AMBULATORY_CARE_PROVIDER_SITE_OTHER): Payer: Medicare Other

## 2017-09-08 DIAGNOSIS — M5432 Sciatica, left side: Secondary | ICD-10-CM | POA: Insufficient documentation

## 2017-09-08 DIAGNOSIS — M25552 Pain in left hip: Secondary | ICD-10-CM

## 2017-09-08 NOTE — Progress Notes (Signed)
Patient is a very pleasant 83 year old female well-known to me.  We have been seeing her for a while now with low back pain but mainly left-sided sciatica.  She is tried activity modification as well as back extension exercises.  We have had her on a combination of Medrol Dosepak followed by Robaxin, tramadol and gabapentin.  None of this is helped.  She still having the same symptoms of pain in the sciatic region and this is worsening for her.  On exam she still has a positive straight leg raise to the left side.  Her left hip exam is otherwise normal.  She is pain in the sciatic area and pain with sciatic stretch.  She has got good strength in her feet.  X-rays of her lumbar spine show anterolisthesis of L4 on L5.  There is significant disc space narrowing at L5-S1 with foraminal stenosis at the lowest level of her lumbar spine.  There is also facet disease.  At this point an MRI is warranted to assess for nerve compression to the left side of the lower aspect of the lumbar spine that will help guide any intervention such as an epidural steroid injection.  We will work on getting this MRI set up so we can discuss further treatment options.

## 2017-09-09 ENCOUNTER — Other Ambulatory Visit (INDEPENDENT_AMBULATORY_CARE_PROVIDER_SITE_OTHER): Payer: Self-pay

## 2017-09-09 DIAGNOSIS — M4807 Spinal stenosis, lumbosacral region: Secondary | ICD-10-CM

## 2017-09-20 ENCOUNTER — Telehealth (INDEPENDENT_AMBULATORY_CARE_PROVIDER_SITE_OTHER): Payer: Self-pay | Admitting: Radiology

## 2017-09-20 ENCOUNTER — Ambulatory Visit
Admission: RE | Admit: 2017-09-20 | Discharge: 2017-09-20 | Disposition: A | Discharge status: Home / Self Care | Payer: Medicare Other | Source: Ambulatory Visit | Attending: Orthopaedic Surgery | Admitting: Orthopaedic Surgery

## 2017-09-20 DIAGNOSIS — M4807 Spinal stenosis, lumbosacral region: Secondary | ICD-10-CM

## 2017-09-20 NOTE — Telephone Encounter (Signed)
[facility] called to give verbal results of scan--I printed report and gave to Dr. Ninfa Linden.

## 2017-09-27 ENCOUNTER — Ambulatory Visit (INDEPENDENT_AMBULATORY_CARE_PROVIDER_SITE_OTHER): Payer: Medicare Other | Admitting: Orthopaedic Surgery

## 2017-09-27 ENCOUNTER — Telehealth (INDEPENDENT_AMBULATORY_CARE_PROVIDER_SITE_OTHER): Payer: Self-pay | Admitting: Orthopaedic Surgery

## 2017-09-27 ENCOUNTER — Encounter (INDEPENDENT_AMBULATORY_CARE_PROVIDER_SITE_OTHER): Payer: Self-pay | Admitting: Orthopaedic Surgery

## 2017-09-27 DIAGNOSIS — M5432 Sciatica, left side: Secondary | ICD-10-CM | POA: Diagnosis not present
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
## 2017-09-27 NOTE — Telephone Encounter (Signed)
She can stop this medication at this standpoint

## 2017-09-27 NOTE — Telephone Encounter (Signed)
Patient aware of the below message  

## 2017-09-27 NOTE — Telephone Encounter (Signed)
Patient was told to call back with the medication she had questions about at her visit this morning, she said it was prescribed by Dr. Ninfa Linden and it is Gabapentin 300 mg.

## 2017-12-22 ENCOUNTER — Encounter (INDEPENDENT_AMBULATORY_CARE_PROVIDER_SITE_OTHER): Payer: Self-pay | Admitting: Orthopaedic Surgery

## 2017-12-22 ENCOUNTER — Ambulatory Visit (INDEPENDENT_AMBULATORY_CARE_PROVIDER_SITE_OTHER): Payer: Medicare Other | Admitting: Orthopaedic Surgery

## 2017-12-22 ENCOUNTER — Other Ambulatory Visit (INDEPENDENT_AMBULATORY_CARE_PROVIDER_SITE_OTHER): Payer: Self-pay | Admitting: Orthopaedic Surgery

## 2017-12-22 ENCOUNTER — Ambulatory Visit (INDEPENDENT_AMBULATORY_CARE_PROVIDER_SITE_OTHER): Payer: Medicare Other

## 2017-12-22 VITALS — Ht 67.0 in | Wt 165.0 lb

## 2017-12-22 DIAGNOSIS — G8929 Other chronic pain: Secondary | ICD-10-CM

## 2017-12-22 DIAGNOSIS — M25511 Pain in right shoulder: Principal | ICD-10-CM

## 2017-12-22 DIAGNOSIS — M7541 Impingement syndrome of right shoulder: Secondary | ICD-10-CM | POA: Diagnosis not present

## 2017-12-22 MED ORDER — LIDOCAINE HCL 1 % IJ SOLN
3.0000 mL | INTRAMUSCULAR | Status: AC | PRN
  Administered 2017-12-22: 3 mL

## 2017-12-22 MED ORDER — METHYLPREDNISOLONE ACETATE 40 MG/ML IJ SUSP
40.0000 mg | INTRAMUSCULAR | Status: AC | PRN
  Administered 2017-12-22: 40 mg via INTRA_ARTICULAR

## 2017-12-22 NOTE — Progress Notes (Signed)
Office Visit Note   Patient: Lindsey Barnett           Date of Birth: [birth year]           MRN: [id number] Visit Date: 12/22/2017              Requested by: Josetta Huddle, MD [address] [place] [address] [place], [place] [address] PCP: Josetta Huddle, MD   Assessment & Plan: Visit Diagnoses:  1. Chronic right shoulder pain   2. Impingement syndrome of right shoulder     Plan: Based on clinical exam I do feel this is impingement syndrome of the shoulder.  I recommend a subacromial steroid injection.  Having had this before she was pleased to try this again.  All questions concerns were answered and addressed.  She tolerated the injection well.  Follow-up will be as needed.  If it is worsening any way she will let us know.  Follow-Up Instructions: Return if symptoms worsen or fail to improve.   Orders:  Orders Placed This Encounter  Procedures  . Large Joint Inj   No orders of the defined types were placed in this encounter.     Procedures: Large Joint Inj: R subacromial bursa on 12/22/2017 3:51 PM Indications: pain and diagnostic evaluation Details: 22 G 1.5 in needle  Arthrogram: No  Medications: 3 mL lidocaine 1 %; 40 mg methylPREDNISolone acetate 40 MG/ML Outcome: tolerated well, no immediate complications Procedure, treatment alternatives, risks and benefits explained, specific risks discussed. Consent was given by the patient. Immediately prior to procedure a time out was called to verify the correct patient, procedure, equipment, support staff and site/side marked as required. Patient was prepped and draped in the usual sterile fashion.       Clinical Data: No additional findings.   Subjective: Chief Complaint  Patient presents with  . Right Shoulder - Pain  Patient comes in today with right shoulder pain.  He has had several months of worsening right shoulder pain is waking her up at night.  It hurts with overhead activities and reaching behind her.  She has had  injections in the past is been over a year.  She is not sleeping well secondary to pain and she feels like her motion started decreased secondary to pain on her right shoulder.  She does also report some trapezius pain.  She denies any neck pain.  She denies any numbness and tingling in her hand.  She denies any previous injuries. HPI  Review of Systems She currently denies any headache, chest pain, shortness of breath, fever, chills, nausea, vomiting.  She is alert and oriented x3 and in no acute distress  Objective: Vital Signs: Ht 5\' 7"  (1.702 m)   Wt 165 lb (74.8 kg)   BMI 25.84 kg/m   Physical Exam She is alert and oriented x3 and in no acute distress  Ortho Exam examination of her right shoulder shows full range of motion.  She does have positive Neer and Hawkins sign But a negative liftoff and good strength in the rotator cuff.  The shoulder is well located. Specialty Comments:  No specialty comments available.  Imaging: Xr Shoulder Right  Result Date: 12/22/2017 3 views of the right shoulder show well located shoulder with no significant arthritic changes.  The subacromial outlet is well-maintained.    PMFS History: Patient Active Problem List   Diagnosis Date Noted  . Impingement syndrome of right shoulder 12/22/2017  . Sciatica, left side 09/08/2017  .  Pain due to right hip joint prosthesis ([place]) 03/06/2016  . 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 Hip fracture requiring operative repair ([place]) 09/22/2015  . Hip injury   . Subcapital fracture of hip ([place]) 09/21/2015  . Hypokalemia 09/21/2015  . HTN (hypertension) 09/21/2015  . GERD (gastroesophageal reflux disease) 09/21/2015  . Lateral meniscal tear 09/16/2011   Past Medical History:  Diagnosis Date  . Acute meniscal tear, lateral RIGHT  . Arthritis HIP  . Cataract immature   . GERD (gastroesophageal reflux disease) OCCASIONAL  . History of TIA (transient ischemic attack) 2005--  NO RESIDUAL    . Hyperlipidemia   . Hypertension   . MVP (mitral valve prolapse)   . Peripheral vascular disease ([place])   . Stress incontinence   . Swelling of right knee joint     Family History  Problem Relation Age of Onset  . Breast cancer Neg Hx     Past Surgical History:  Procedure Laterality Date  . ANTERIOR APPROACH HEMI HIP ARTHROPLASTY Right 09/22/2015   Procedure: ANTERIOR APPROACH HEMI HIP ARTHROPLASTY;  Surgeon: Dorna Leitz, MD;  Location: [facility];  Service: Orthopedics;  Laterality: Right;  . BREAST CYST EXCISION Right   . CHOLECYSTECTOMY  1975  . KNEE ARTHROSCOPY  09/16/2011   Procedure: ARTHROSCOPY KNEE;  Surgeon: Gearlean Alf, MD;  Location[facility];  Service: Orthopedics;  Laterality: Right;  WITH lateral meniscal debridement  . TOTAL HIP ARTHROPLASTY Right 03/06/2016   Procedure: CONVERSION RIGHT HIP HEMIARTHROPLASTY TO RIGHT TOTAL HIP ARTHROPLASTY ANTERIOR APPROACH;  Surgeon: Mcarthur Rossetti, MD;  Location[facility];  Service: Orthopedics;  Laterality: Right;  Marland Kitchen VAGINAL HYSTERECTOMY  1973   Social History   Occupational History  . Not on file  Tobacco Use  . Smoking status: Never Smoker  . Smokeless tobacco: Never Used  Substance and Sexual Activity  . Alcohol use: Yes    Comment: OCCASIONAL  . Drug use: No  . Sexual activity: Not on file

## 2018-04-20 ENCOUNTER — Other Ambulatory Visit: Payer: Self-pay | Admitting: Internal Medicine

## 2018-04-20 DIAGNOSIS — Z1231 Encounter for screening mammogram for malignant neoplasm of breast: Secondary | ICD-10-CM

## 2018-05-26 ENCOUNTER — Ambulatory Visit
Admission: RE | Admit: 2018-05-26 | Discharge: 2018-05-26 | Disposition: A | Discharge status: Home / Self Care | Payer: Medicare Other | Source: Ambulatory Visit | Attending: Internal Medicine | Admitting: Internal Medicine

## 2018-05-26 DIAGNOSIS — Z1231 Encounter for screening mammogram for malignant neoplasm of breast: Secondary | ICD-10-CM

## 2019-04-13 ENCOUNTER — Other Ambulatory Visit: Payer: Self-pay | Admitting: Internal Medicine

## 2019-04-13 DIAGNOSIS — Z1231 Encounter for screening mammogram for malignant neoplasm of breast: Secondary | ICD-10-CM

## 2019-05-10 ENCOUNTER — Ambulatory Visit (INDEPENDENT_AMBULATORY_CARE_PROVIDER_SITE_OTHER): Payer: Medicare Other | Admitting: Orthopaedic Surgery

## 2019-05-10 ENCOUNTER — Other Ambulatory Visit: Payer: Self-pay

## 2019-05-10 ENCOUNTER — Encounter: Payer: Self-pay | Admitting: Orthopaedic Surgery

## 2019-05-10 DIAGNOSIS — M722 Plantar fascial fibromatosis: Secondary | ICD-10-CM

## 2019-05-10 DIAGNOSIS — M79672 Pain in left foot: Secondary | ICD-10-CM

## 2019-05-10 MED ORDER — METHYLPREDNISOLONE ACETATE 40 MG/ML IJ SUSP
40.0000 mg | INTRAMUSCULAR | Status: AC | PRN
  Administered 2019-05-10: 10:00:00 40 mg

## 2019-05-10 MED ORDER — LIDOCAINE HCL 1 % IJ SOLN
1.0000 mL | INTRAMUSCULAR | Status: AC | PRN
  Administered 2019-05-10: 10:00:00 1 mL

## 2019-05-10 NOTE — Progress Notes (Signed)
Office Visit Note   Patient: Lindsey Barnett           Date of Birth: February 01, 1935           MRN: [id number] Visit Date: 05/10/2019              Requested by: Josetta Huddle, MD [address] [place],  [place] [address] PCP: Josetta Huddle, MD   Assessment & Plan: Visit Diagnoses:  1. Pain of left heel   2. Plantar fasciitis, left     Plan: Her signs and symptoms as well as clinical exam are all consistent with plantar fasciitis.  I did recommend a steroid injection in the plantar fashion she agreed with this.  I want her to wear tennis shoes all day long for the next month.  Also will do try Voltaren gel as an over-the-counter anti-inflammatory on this area.  I have shown her stretching exercises to try in the morning and evenings.  We will see her back in 4 weeks to see how she is doing overall.  My neck step would be formal physical therapy if needed.  All questions and concerns were answered and addressed.  Follow-Up Instructions: Return in about 4 weeks (around 06/07/2019).   Orders:  Orders Placed This Encounter  Procedures  . Foot Inj   No orders of the defined types were placed in this encounter.     Procedures: Foot Inj  Date/Time: 05/10/2019 9:35 AM Performed by: Mcarthur Rossetti, MD Authorized by: Mcarthur Rossetti, MD   Condition: Plantar Fasciitis   Location: left plantar fascia muscle   Medications:  1 mL lidocaine 1 %; 40 mg methylPREDNISolone acetate 40 MG/ML     Clinical Data: No additional findings.   Subjective: Chief Complaint  Patient presents with  . Left Foot - Pain  The patient comes in today for evaluation treatment of left foot pain.  She is very active 84 year old female and reports pain at the bottom of her heel on the left side.  It hurts worse in the morning when she takes steps on it and she says she has to hold onto the wall just to get to the bathroom because of the pain.  She has tried over-the-counter  anti-inflammatories and is now on Celebrex and Neurontin but none of this is helped.  She mainly wears open back shoes but is recently gotten some tennis shoes to try to wear but she does not wear them regularly and she is not in tennis shoes today.  She denies any numbness and tingling in her feet.  She denies any injuries.  HPI  Review of Systems She currently denies any headache, chest pain, shortness of breath, fever, chills, nausea, vomiting  Objective: Vital Signs: There were no vitals taken for this visit.  Physical Exam She is alert and orient x3 and in no acute distress Ortho Exam Examination of her left foot shows normal contours of the foot and the heel as well as the ankle.  Her range of motion is full.  Her pain is over the plantar aspect of the left heel toward the lateral aspect.  She is neurovascularly intact and her foot is well-perfused. Specialty Comments:  No specialty comments available.  Imaging: No results found.   PMFS History: Patient Active Problem List   Diagnosis Date Noted  . Impingement syndrome of right shoulder 12/22/2017  . Sciatica, left side 09/08/2017  . Pain due to right hip joint prosthesis ([place]) 03/06/2016  .  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 Hip fracture requiring operative repair ([place]) 09/22/2015  . Hip injury   . Subcapital fracture of hip ([place]) 09/21/2015  . Hypokalemia 09/21/2015  . HTN (hypertension) 09/21/2015  . GERD (gastroesophageal reflux disease) 09/21/2015  . Lateral meniscal tear 09/16/2011   Past Medical History:  Diagnosis Date  . Acute meniscal tear, lateral RIGHT  . Arthritis HIP  . Cataract immature   . GERD (gastroesophageal reflux disease) OCCASIONAL  . History of TIA (transient ischemic attack) 2005--  NO RESIDUAL  . Hyperlipidemia   . Hypertension   . MVP (mitral valve prolapse)   . Peripheral vascular disease ([place])   . Stress incontinence   . Swelling of right knee joint     Family  History  Problem Relation Age of Onset  . Breast cancer Neg Hx     Past Surgical History:  Procedure Laterality Date  . ANTERIOR APPROACH HEMI HIP ARTHROPLASTY Right 09/22/2015   Procedure: ANTERIOR APPROACH HEMI HIP ARTHROPLASTY;  Surgeon: Dorna Leitz, MD;  Location: [facility];  Service: Orthopedics;  Laterality: Right;  . BREAST CYST EXCISION Right   . CHOLECYSTECTOMY  1975  . KNEE ARTHROSCOPY  09/16/2011   Procedure: ARTHROSCOPY KNEE;  Surgeon: Gearlean Alf, MD;  Location: [facility];  Service: Orthopedics;  Laterality: Right;  WITH lateral meniscal debridement  . TOTAL HIP ARTHROPLASTY Right 03/06/2016   Procedure: CONVERSION RIGHT HIP HEMIARTHROPLASTY TO RIGHT TOTAL HIP ARTHROPLASTY ANTERIOR APPROACH;  Surgeon: Mcarthur Rossetti, MD;  Location: [facility];  Service: Orthopedics;  Laterality: Right;  Marland Kitchen VAGINAL HYSTERECTOMY  1973   Social History   Occupational History  . Not on file  Tobacco Use  . Smoking status: Never Smoker  . Smokeless tobacco: Never Used  Substance and Sexual Activity  . Alcohol use: Yes    Comment: OCCASIONAL  . Drug use: No  . Sexual activity: Not on file

## 2019-05-17 ENCOUNTER — Ambulatory Visit: Payer: Medicare Other | Admitting: Orthopaedic Surgery

## 2019-05-30 ENCOUNTER — Ambulatory Visit
Admission: RE | Admit: 2019-05-30 | Discharge: 2019-05-30 | Disposition: A | Discharge status: Home / Self Care | Payer: Medicare Other | Source: Ambulatory Visit | Attending: Internal Medicine | Admitting: Internal Medicine

## 2019-05-30 ENCOUNTER — Other Ambulatory Visit: Payer: Self-pay

## 2019-05-30 DIAGNOSIS — Z1231 Encounter for screening mammogram for malignant neoplasm of breast: Secondary | ICD-10-CM

## 2019-06-12 ENCOUNTER — Other Ambulatory Visit: Payer: Self-pay

## 2019-06-12 ENCOUNTER — Ambulatory Visit: Payer: Medicare Other | Admitting: Orthopaedic Surgery

## 2019-06-12 ENCOUNTER — Encounter: Payer: Self-pay | Admitting: Orthopaedic Surgery

## 2019-06-12 DIAGNOSIS — M722 Plantar fascial fibromatosis: Secondary | ICD-10-CM

## 2019-06-12 DIAGNOSIS — M79672 Pain in left foot: Secondary | ICD-10-CM | POA: Diagnosis not present

## 2019-06-12 NOTE — Progress Notes (Signed)
The patient comes in still with significant left heel pain.  I had diagnosed her with plantar fasciitis.  She is 84 years old.  She has been trying stretching and Voltaren gel.  She says is only a little bit better.  On exam she still hurts consistently on the plantar fascia.  Her Achilles is intact.  Her [place] Silos test is negative.  She has good range of motion of her left ankle.  She has a normal-appearing arch.  When she does walk though she does have significant valgus malalignment of the knee.  I do feel that she would benefit from outpatient physical therapy for any modalities that may help decrease her symptoms as it involves her left foot.  We will work on setting this up.  She is interested in trying this.  I will see her back in 4 weeks to see how that is coming along and consider a second repeat injection.  All question concerns were answered and addressed.

## 2019-06-22 ENCOUNTER — Observation Stay (HOSPITAL_COMMUNITY)
Admission: EM | Admit: 2019-06-22 | Discharge: 2019-06-23 | Disposition: A | Discharge status: Home / Self Care | Payer: Medicare Other | Attending: Internal Medicine | Admitting: Internal Medicine

## 2019-06-22 ENCOUNTER — Other Ambulatory Visit: Payer: Self-pay

## 2019-06-22 ENCOUNTER — Encounter (HOSPITAL_COMMUNITY): Payer: Self-pay

## 2019-06-22 ENCOUNTER — Emergency Department (HOSPITAL_COMMUNITY): Payer: Medicare Other

## 2019-06-22 DIAGNOSIS — I440 Atrioventricular block, first degree: Secondary | ICD-10-CM | POA: Diagnosis not present

## 2019-06-22 DIAGNOSIS — I739 Peripheral vascular disease, unspecified: Secondary | ICD-10-CM

## 2019-06-22 DIAGNOSIS — K219 Gastro-esophageal reflux disease without esophagitis: Secondary | ICD-10-CM | POA: Insufficient documentation

## 2019-06-22 DIAGNOSIS — E785 Hyperlipidemia, unspecified: Secondary | ICD-10-CM

## 2019-06-22 DIAGNOSIS — Z20828 Contact with and (suspected) exposure to other viral communicable diseases: Secondary | ICD-10-CM | POA: Diagnosis not present

## 2019-06-22 DIAGNOSIS — R079 Chest pain, unspecified: Secondary | ICD-10-CM | POA: Diagnosis present

## 2019-06-22 DIAGNOSIS — Z7902 Long term (current) use of antithrombotics/antiplatelets: Secondary | ICD-10-CM | POA: Diagnosis not present

## 2019-06-22 DIAGNOSIS — Z96641 Presence of right artificial hip joint: Secondary | ICD-10-CM | POA: Insufficient documentation

## 2019-06-22 DIAGNOSIS — I10 Essential (primary) hypertension: Secondary | ICD-10-CM | POA: Diagnosis not present

## 2019-06-22 DIAGNOSIS — Z79899 Other long term (current) drug therapy: Secondary | ICD-10-CM | POA: Insufficient documentation

## 2019-06-22 DIAGNOSIS — R0602 Shortness of breath: Secondary | ICD-10-CM | POA: Diagnosis not present

## 2019-06-22 DIAGNOSIS — R110 Nausea: Secondary | ICD-10-CM | POA: Diagnosis not present

## 2019-06-22 DIAGNOSIS — Z7982 Long term (current) use of aspirin: Secondary | ICD-10-CM | POA: Diagnosis not present

## 2019-06-22 DIAGNOSIS — R0789 Other chest pain: Secondary | ICD-10-CM

## 2019-06-22 DIAGNOSIS — Z8673 Personal history of transient ischemic attack (TIA), and cerebral infarction without residual deficits: Secondary | ICD-10-CM | POA: Insufficient documentation

## 2019-06-22 LAB — BASIC METABOLIC PANEL
Anion gap: 10 (ref 5–15)
BUN: 15 mg/dL (ref 8–23)
CO2: 25 mmol/L (ref 22–32)
Calcium: 9.1 mg/dL (ref 8.9–10.3)
Chloride: 105 mmol/L (ref 98–111)
Creatinine, Ser: 0.8 mg/dL (ref 0.44–1.00)
GFR calc Af Amer: >60 mL/min (ref >60)
GFR calc non Af Amer: >60 mL/min (ref >60)
Glucose, Bld: 95 mg/dL (ref 70–99)
Potassium: 4.2 mmol/L (ref 3.5–5.1)
Sodium: 140 mmol/L (ref 135–145)

## 2019-06-22 LAB — CBC
HCT: 42 % (ref 36.0–46.0)
Hemoglobin: 13.7 g/dL (ref 12.0–15.0)
MCH: 29.9 pg (ref 26.0–34.0)
MCHC: 32.6 g/dL (ref 30.0–36.0)
MCV: 91.7 fL (ref 80.0–100.0)
Platelets: 247 10*3/uL (ref 150–400)
RBC: 4.58 MIL/uL (ref 3.87–5.11)
RDW: 14.6 % (ref 11.5–15.5)
WBC: 7.6 10*3/uL (ref 4.0–10.5)
nRBC: 0 % (ref 0.0–0.2)

## 2019-06-22 LAB — TROPONIN I (HIGH SENSITIVITY)
Troponin I (High Sensitivity): 4 ng/L (ref <18)
Troponin I (High Sensitivity): 4 ng/L (ref <18)

## 2019-06-22 LAB — D-DIMER, QUANTITATIVE: D-Dimer, Quant: 2.15 ug/mL-FEU — ABNORMAL HIGH (ref 0.00–0.50)

## 2019-06-22 LAB — SARS CORONAVIRUS 2 (TAT 6-24 HRS): SARS Coronavirus 2: NEGATIVE

## 2019-06-22 LAB — BRAIN NATRIURETIC PEPTIDE: B Natriuretic Peptide: 153.6 pg/mL — ABNORMAL HIGH (ref 0.0–100.0)

## 2019-06-22 MED ORDER — POLYETHYLENE GLYCOL 3350 17 G PO PACK: 17.0000 g | PACK | Freq: Every day | ORAL | Status: DC | PRN

## 2019-06-22 MED ORDER — LIDOCAINE VISCOUS HCL 2 % MT SOLN
15.0000 mL | Freq: Once | OROMUCOSAL | Status: AC
  Administered 2019-06-22: 15 mL via ORAL
  Filled 2019-06-22: qty 15

## 2019-06-22 MED ORDER — ADULT MULTIVITAMIN W/MINERALS CH: 1.0000 | ORAL_TABLET | Freq: Every day | ORAL | Status: DC

## 2019-06-22 MED ORDER — ACETAMINOPHEN 500 MG PO TABS
1000.0000 mg | ORAL_TABLET | Freq: Four times a day (QID) | ORAL | Status: DC | PRN
  Administered 2019-06-23: 1000 mg via ORAL
  Filled 2019-06-22: qty 2

## 2019-06-22 MED ORDER — CLOPIDOGREL BISULFATE 75 MG PO TABS
75.0000 mg | ORAL_TABLET | Freq: Every day | ORAL | Status: DC
  Administered 2019-06-23: 75 mg via ORAL
  Filled 2019-06-22: qty 1

## 2019-06-22 MED ORDER — ROSUVASTATIN CALCIUM 5 MG PO TABS
10.0000 mg | ORAL_TABLET | ORAL | Status: DC
  Administered 2019-06-23: 10 mg via ORAL
  Filled 2019-06-22: qty 2

## 2019-06-22 MED ORDER — ALUM & MAG HYDROXIDE-SIMETH 200-200-20 MG/5ML PO SUSP: 30.0000 mL | Freq: Four times a day (QID) | ORAL | Status: DC | PRN

## 2019-06-22 MED ORDER — IRBESARTAN 150 MG PO TABS
150.0000 mg | ORAL_TABLET | Freq: Every day | ORAL | Status: DC
  Administered 2019-06-23: 150 mg via ORAL
  Filled 2019-06-22: qty 1

## 2019-06-22 MED ORDER — ENOXAPARIN SODIUM 40 MG/0.4ML ~~LOC~~ SOLN
40.0000 mg | SUBCUTANEOUS | Status: DC
  Administered 2019-06-22: 40 mg via SUBCUTANEOUS
  Filled 2019-06-22: qty 0.4

## 2019-06-22 MED ORDER — COQ10 200 MG PO CAPS: 200.0000 mg | ORAL_CAPSULE | Freq: Every day | ORAL | Status: DC

## 2019-06-22 MED ORDER — ALUM & MAG HYDROXIDE-SIMETH 200-200-20 MG/5ML PO SUSP
30.0000 mL | Freq: Once | ORAL | Status: AC
  Administered 2019-06-22: 30 mL via ORAL
  Filled 2019-06-22: qty 30

## 2019-06-22 MED ORDER — PANTOPRAZOLE SODIUM 40 MG PO TBEC
40.0000 mg | DELAYED_RELEASE_TABLET | Freq: Every day | ORAL | Status: DC
  Administered 2019-06-23: 40 mg via ORAL
  Filled 2019-06-22: qty 1

## 2019-06-22 MED ORDER — VITAMIN D 25 MCG (1000 UNIT) PO TABS
2000.0000 [IU] | ORAL_TABLET | Freq: Every day | ORAL | Status: DC
  Administered 2019-06-23: 2000 [IU] via ORAL
  Filled 2019-06-22: qty 2

## 2019-06-22 MED ORDER — LIDOCAINE VISCOUS HCL 2 % MT SOLN: 15.0000 mL | Freq: Four times a day (QID) | OROMUCOSAL | Status: DC | PRN

## 2019-06-22 MED ORDER — HYDRALAZINE HCL 20 MG/ML IJ SOLN
5.0000 mg | INTRAMUSCULAR | Status: DC | PRN
  Administered 2019-06-22 – 2019-06-23 (×2): 5 mg via INTRAVENOUS
  Filled 2019-06-22 (×2): qty 1

## 2019-06-22 MED ORDER — ASPIRIN 81 MG PO CHEW
162.0000 mg | CHEWABLE_TABLET | Freq: Once | ORAL | Status: AC
  Administered 2019-06-22: 162 mg via ORAL
  Filled 2019-06-22: qty 2

## 2019-06-22 NOTE — ED Triage Notes (Signed)
Pt arrives POV for eval of persistent chest pain since last night. Pt reports that she thought it was indigestion, attempted OTC meds w/ no relief. Reports CP ongoing this AM w/ associated SOB.

## 2019-06-22 NOTE — H&P (Addendum)
History and Physical    Lindsey Barnett [id number] DOB: 06/10/1935 DOA: 06/22/2019  PCP: Josetta Huddle, MD Patient coming from: Home  Chief Complaint: Chest pain  HPI: Lindsey Barnett is a 84 y.o. female with medical history significant of hypertension, hyperlipidemia, mitral valve prolapse, peripheral arterial disease, TIA presenting with a chief complaint of chest pain.  Patient states last night she started having pain on the right side of her chest in her sleep.  This morning she woke up with substernal chest pain.  The pain is 5-8/10 in intensity.  She describes it as a burning pressure sensation.  Chest pain is not associated with dyspnea, diaphoresis, or nausea.  States during the day she was trying to do decorations and continued to have chest pain.  The chest pain has been persistent since yesterday.  She tried taking Protonix and Pepto-Bismol at home but they did not help.  Her chest pain is now better after receiving GI cocktail in the ED.  Denies history of CAD.  Denies history of PE.  Denies history of tobacco use.  Reports compliance with her home blood pressure medications.  Does report checking her blood pressure this morning and systolic was in the 0000000.  Reports taking Plavix daily for peripheral arterial disease but not aspirin because of causes skin bruising.  ED Course: Bradycardic with heart rate in the 50s.  Blood pressure elevated with systolic up to [id number].  Not hypoxic. High-sensitivity troponin x2 negative.  EKG x3 without acute ischemic changes.  BNP 153.  Chest x-ray showing 2 new small nodular opacities in the periphery of the right lower lung.  Chest CT is recommended for further evaluation. Patient received aspirin and GI cocktail.  Review of Systems:  All systems reviewed and apart from history of presenting illness, are negative.  Past Medical History:  Diagnosis Date  . Acute meniscal tear, lateral RIGHT  . Arthritis HIP  . Cataract immature   . GERD  (gastroesophageal reflux disease) OCCASIONAL  . History of TIA (transient ischemic attack) 2005--  NO RESIDUAL  . Hyperlipidemia   . Hypertension   . MVP (mitral valve prolapse)   . Peripheral vascular disease ([place])   . Stress incontinence   . Swelling of right knee joint     Past Surgical History:  Procedure Laterality Date  . ANTERIOR APPROACH HEMI HIP ARTHROPLASTY Right 09/22/2015   Procedure: ANTERIOR APPROACH HEMI HIP ARTHROPLASTY;  Surgeon: Dorna Leitz, MD;  Location: [facility];  Service: Orthopedics;  Laterality: Right;  . BREAST CYST EXCISION Right   . CHOLECYSTECTOMY  1975  . KNEE ARTHROSCOPY  09/16/2011   Procedure: ARTHROSCOPY KNEE;  Surgeon: Gearlean Alf, MD;  Location: [facility];  Service: Orthopedics;  Laterality: Right;  WITH lateral meniscal debridement  . TOTAL HIP ARTHROPLASTY Right 03/06/2016   Procedure: CONVERSION RIGHT HIP HEMIARTHROPLASTY TO RIGHT TOTAL HIP ARTHROPLASTY ANTERIOR APPROACH;  Surgeon: Mcarthur Rossetti, MD;  Location: [facility];  Service: Orthopedics;  Laterality: Right;  Marland Kitchen VAGINAL HYSTERECTOMY  1973     reports that she has never smoked. She has never used smokeless tobacco. She reports current alcohol use. She reports that she does not use drugs.  Allergies  Allergen Reactions  . Codeine Other (See Comments)    STOMACHE SPASMS  . Contrast Media [Iodinated Diagnostic Agents] Hives  . Iodine Other (See Comments)    Hives?  . Sodium Tetradecyl Sulfate Other (See Comments)    unknown    Family History  Problem Relation Age of Onset  . Breast cancer Neg Hx     Prior to Admission medications   Medication Sig Start Date End Date Taking? Authorizing Provider  acetaminophen (TYLENOL) 500 MG tablet Take 1,000 mg by mouth every 6 (six) hours as needed for mild pain.   Yes [provider]  amoxicillin (AMOXIL) 500 MG capsule Take 2,000 mg by mouth daily as needed (1 hour prior to dental appointments).   Yes [provider]  aspirin 81 MG tablet Take 81 mg by mouth once a week.    Yes [provider]  Cholecalciferol 2000 units CAPS Take 2,000 Units by mouth daily.   Yes [provider]  clopidogrel (PLAVIX) 75 MG tablet Take 75 mg by mouth daily.   Yes [provider]  Coenzyme Q10 (COQ10) 200 MG CAPS Take 200 mg by mouth daily.    Yes [provider]  estradiol (ESTRACE) 0.5 MG tablet Take 0.5 mg by mouth daily. Take for 2 weeks and off for 1 week   Yes [provider]  Multiple Vitamin (MULTIVITAMIN) tablet Take 1 tablet by mouth at bedtime.    Yes [provider]  pantoprazole (PROTONIX) 40 MG tablet Take 40 mg by mouth daily as needed (acid reflux).  08/28/15  Yes [provider]  polyethylene glycol (MIRALAX / GLYCOLAX) packet Take 17 g by mouth daily. Patient taking differently: Take 17 g by mouth daily as needed for mild constipation.  09/24/15  Yes Delfina Redwood, MD  rosuvastatin (CRESTOR) 10 MG tablet Take 10 mg by mouth 3 (three) times a week. MWF   Yes [provider]  valsartan (DIOVAN) 160 MG tablet Take 160 mg by mouth daily. If systolic blood pressure greater than 140   Yes [provider]  methocarbamol (ROBAXIN) 500 MG tablet Take 1 tablet (500 mg total) by mouth 4 (four) times daily. Patient not taking: Reported on 06/22/2019 08/26/17   Mcarthur Rossetti, MD  methylPREDNISolone (MEDROL) 4 MG tablet Medrol dose pack. Take as instructed Patient not taking: Reported on 06/22/2019 08/26/17   Mcarthur Rossetti, MD    Physical Exam: Vitals:   06/22/19 1830 06/22/19 1845 06/22/19 1900 06/22/19 1915  BP: (!) 170/75 (!) 174/71 (!) 147/97 (!) 175/80  Pulse: 72 (!) 49 (!) 57 (!) 52  Resp: (!) 22 13 13 17   Temp:      TempSrc:      SpO2: 99% 98% 100% 98%  Weight:      Height:        Physical Exam  Constitutional: She is oriented to person, place, and time. She appears well-developed and  well-nourished. No distress.  HENT:  Head: Normocephalic.  Eyes: Right eye exhibits no discharge. Left eye exhibits no discharge.  Neck: Neck supple.  Cardiovascular: Regular rhythm and intact distal pulses.  Slightly bradycardic  Pulmonary/Chest: Effort normal and breath sounds normal. No respiratory distress. She has no wheezes. She has no rales.  Abdominal: Soft. Bowel sounds are normal. She exhibits no distension. There is no abdominal tenderness. There is no guarding.  Musculoskeletal:        General: No edema.  Neurological: She is alert and oriented to person, place, and time.  Skin: Skin is warm and dry. She is not diaphoretic.     Labs on Admission: I have personally reviewed following labs and imaging studies  CBC: Recent Labs  Lab 06/22/19 1526  WBC 7.6  HGB 13.7  HCT 42.0  MCV 91.7  PLT [id number]   Basic Metabolic Panel: Recent Labs  Lab 06/22/19 1526  NA 140  K 4.2  CL 105  CO2 25  GLUCOSE 95  BUN 15  CREATININE 0.80  CALCIUM 9.1   GFR: Estimated Creatinine Clearance: 53.8 mL/min (by C-G formula based on SCr of 0.8 mg/dL). Liver Function Tests: No results for input(s): AST, ALT, ALKPHOS, BILITOT, PROT, ALBUMIN in the last 168 hours. No results for input(s): LIPASE, AMYLASE in the last 168 hours. No results for input(s): AMMONIA in the last 168 hours. Coagulation Profile: No results for input(s): INR, PROTIME in the last 168 hours. Cardiac Enzymes: No results for input(s): CKTOTAL, CKMB, CKMBINDEX, TROPONINI in the last 168 hours. BNP (last 3 results) No results for input(s): PROBNP in the last 8760 hours. HbA1C: No results for input(s): HGBA1C in the last 72 hours. CBG: No results for input(s): GLUCAP in the last 168 hours. Lipid Profile: No results for input(s): CHOL, HDL, LDLCALC, TRIG, CHOLHDL, LDLDIRECT in the last 72 hours. Thyroid Function Tests: No results for input(s): TSH, T4TOTAL, FREET4, T3FREE, THYROIDAB in the last 72 hours. Anemia  Panel: No results for input(s): VITAMINB12, FOLATE, FERRITIN, TIBC, IRON, RETICCTPCT in the last 72 hours. Urine analysis:    Component Value Date/Time   COLORURINE YELLOW 09/22/2015 0650   APPEARANCEUR CLEAR 09/22/2015 0650   LABSPEC 1.021 09/22/2015 0650   PHURINE 5.5 09/22/2015 0650   GLUCOSEU NEGATIVE 09/22/2015 0650   HGBUR NEGATIVE 09/22/2015 0650   BILIRUBINUR NEGATIVE 09/22/2015 0650   KETONESUR NEGATIVE 09/22/2015 0650   PROTEINUR NEGATIVE 09/22/2015 0650   NITRITE NEGATIVE 09/22/2015 0650   LEUKOCYTESUR NEGATIVE 09/22/2015 0650    Radiological Exams on Admission: Dg Chest 2 View  Result Date: 06/22/2019 CLINICAL DATA:  Chest pain EXAM: CHEST - 2 VIEW COMPARISON:  09/22/2015 chest radiograph. FINDINGS: Stable cardiomediastinal silhouette with normal heart size. No pneumothorax. No pleural effusion. Two new small nodular opacities in the peripheral lower right lung, largest 6 mm. No pulmonary edema. No acute consolidative airspace disease. IMPRESSION: Two new small nodular opacities in the peripheral lower right lung. Recommend chest CT for further evaluation. Electronically Signed   By: Ilona Sorrel M.D.   On: 06/22/2019 15:39    EKG: Independently reviewed.  Sinus rhythm, first-degree AV block.  No significant change since prior tracing.  Assessment/Plan Principal Problem:   Chest pain Active Problems:   First degree AV block   Uncontrolled hypertension   HLD (hyperlipidemia)   PAD (peripheral artery disease) (HCC)   Chest pain Appears atypical based on history but does have risk factors and heart score 5.  Chest pain could possibly be GI related (GERD/dyspepsia) as patient did have some relief with GI cocktail.  ACS less likely given high-sensitivity troponin x2 negative and EKG x3 without acute ischemic changes.  PE less likely given no tachycardia or hypoxia. -Cardiac monitoring -Received full dose aspirin in the ED -Echocardiogram -Repeat EKG as needed for  recurrence of chest pain -Protonix -GI cocktail as needed -Check D-dimer level.  If elevated, CT angiogram to rule out PE.  Addendum: Age-adjusted D-dimer elevated.  Patient continues to be hemodynamically stable.  No tachycardia, respiratory distress, or hypoxia.  Low suspicion for PE.  She has a contrast allergy.  VQ scan ordered.  First-degree AV block on EKG Bradycardic with heart rate in the 50s. -Cardiac monitoring -Check TSH  Uncontrolled hypertension Blood pressure elevated with systolic up to XX123456.  Patient reports compliance with home irbesartan. -  Continue irbesartan -Hydralazine as needed for SBP greater than 160  Abnormal chest x-ray Chest x-ray showing 2 new small nodular opacities in the periphery of the right lower lung.  Patient is a non-smoker. -CT chest for further evaluation  Hyperlipidemia -Continue Crestor -Check lipid panel  Peripheral arterial disease -Continue Plavix  DVT prophylaxis: Lovenox Code Status: Patient wishes to be full code. Family Communication: Husband at bedside. Disposition Plan: Anticipate discharge in 1 to 2 days. Consults called: None Admission status: It is my clinical opinion that referral for OBSERVATION is reasonable and necessary in this patient based on the above information provided. The aforementioned taken together are felt to place the patient at high risk for further clinical deterioration. However it is anticipated that the patient may be medically stable for discharge from the hospital within 24 to 48 hours.  The medical decision making on this patient was of high complexity and the patient is at high risk for clinical deterioration, therefore this is a level 3 visit.  Shela Leff MD [facility] Pager [phone]  If 7PM-7AM, please contact night-coverage [email or web address] Password [facility]  06/22/2019, 8:18 PM

## 2019-06-22 NOTE — Progress Notes (Signed)
Patient arrived to 4E-20. Cardiac monitor applied,CCMD notified, CHG bath given, call bell and bedside table within reach. Will continue to monitor.

## 2019-06-22 NOTE — ED Provider Notes (Signed)
[facility] EMERGENCY DEPARTMENT Provider Note   CSN: [id number] Arrival date & time: 06/22/19  1434     History   Chief Complaint Chief Complaint  Patient presents with  . Chest Pain    HPI Lindsey Barnett is a 84 y.o. female history of TIA, HLD, HTN, MVP, PVD on Plavix and Lasix.   HPI: A 84 year old patient with a history of CVA, hypertension and hypercholesterolemia presents for evaluation of chest pain. Initial onset of pain was more than 6 hours ago. The patient's chest pain is described as heaviness/pressure/tightness, is sharp and is not worse with exertion. The patient complains of nausea. The patient's chest pain is not middle- or left-sided, is not well-localized and does not radiate to the arms/jaw/neck. The patient denies diaphoresis. The patient has no history of peripheral artery disease, has not smoked in the past 90 days, denies any history of treated diabetes, has no relevant family history of coronary artery disease (first degree relative at less than age 27) and does not have an elevated BMI (>=30).   HPI  Patient presents for right-sided breast anterior chest wall pain that is sharp, persistent, with gradual onset that began last night.  Denies any radiation of pain, arm pain, jaw pain, or back pain.  Patient states it is nonexertional, nonpleuritic does endorse associated nausea and shortness of breath.  Patient states she is having no difficulty breathing however and denies wheezing.  Patient states that she has history of reflux and states that she used her usual over-the-counter medications last night with no relief.  Patient states this does not feel similar to her reflux.  Denies any history of similar symptoms.  States that she had a cyst removed in her right breast several years ago approximately where the pain is.  Denies any history of DVT/PE, denies cough, congestion, Covid exposure, changes in smell or taste.   Has a history of herpes  zoster, no new exercises or strenuous activities.  Denies fevers, chills.  Denies lightheadedness or dizziness.  Past Medical History:  Diagnosis Date  . Acute meniscal tear, lateral RIGHT  . Arthritis HIP  . Cataract immature   . GERD (gastroesophageal reflux disease) OCCASIONAL  . History of TIA (transient ischemic attack) 2005--  NO RESIDUAL  . Hyperlipidemia   . Hypertension   . MVP (mitral valve prolapse)   . Peripheral vascular disease ([place])   . Stress incontinence   . Swelling of right knee joint     Patient Active Problem List   Diagnosis Date Noted  . Chest pain 06/22/2019  . Impingement syndrome of right shoulder 12/22/2017  . Sciatica, left side 09/08/2017  . Pain due to right hip joint prosthesis ([place]) 03/06/2016  . 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 Hip fracture requiring operative repair ([place]) 09/22/2015  . Hip injury   . Subcapital fracture of hip ([place]) 09/21/2015  . Hypokalemia 09/21/2015  . HTN (hypertension) 09/21/2015  . GERD (gastroesophageal reflux disease) 09/21/2015  . Lateral meniscal tear 09/16/2011    Past Surgical History:  Procedure Laterality Date  . ANTERIOR APPROACH HEMI HIP ARTHROPLASTY Right 09/22/2015   Procedure: ANTERIOR APPROACH HEMI HIP ARTHROPLASTY;  Surgeon: Dorna Leitz, MD;  Location: [facility];  Service: Orthopedics;  Laterality: Right;  . BREAST CYST EXCISION Right   . CHOLECYSTECTOMY  1975  . KNEE ARTHROSCOPY  09/16/2011   Procedure: ARTHROSCOPY KNEE;  Surgeon: Gearlean Alf, MD;  Location: [facility];  Service: Orthopedics;  Laterality: Right;  WITH lateral meniscal debridement  . TOTAL HIP ARTHROPLASTY Right 03/06/2016   Procedure: CONVERSION RIGHT HIP HEMIARTHROPLASTY TO RIGHT TOTAL HIP ARTHROPLASTY ANTERIOR APPROACH;  Surgeon: Mcarthur Rossetti, MD;  Location: [facility];  Service: Orthopedics;  Laterality: Right;  Marland Kitchen VAGINAL HYSTERECTOMY  1973     OB History   No obstetric history on  file.      Home Medications    Prior to Admission medications   Medication Sig Start Date End Date Taking? Authorizing Provider  acetaminophen (TYLENOL) 500 MG tablet Take 1,000 mg by mouth every 6 (six) hours as needed for mild pain.   Yes [provider]  amoxicillin (AMOXIL) 500 MG capsule Take 2,000 mg by mouth daily as needed (1 hour prior to dental appointments).   Yes [provider]  aspirin 81 MG tablet Take 81 mg by mouth once a week.    Yes [provider]  Cholecalciferol 2000 units CAPS Take 2,000 Units by mouth daily.   Yes [provider]  clopidogrel (PLAVIX) 75 MG tablet Take 75 mg by mouth daily.   Yes [provider]  Coenzyme Q10 (COQ10) 200 MG CAPS Take 200 mg by mouth daily.    Yes [provider]  estradiol (ESTRACE) 0.5 MG tablet Take 0.5 mg by mouth daily. Take for 2 weeks and off for 1 week   Yes [provider]  Multiple Vitamin (MULTIVITAMIN) tablet Take 1 tablet by mouth at bedtime.    Yes [provider]  pantoprazole (PROTONIX) 40 MG tablet Take 40 mg by mouth daily as needed (acid reflux).  08/28/15  Yes [provider]  polyethylene glycol (MIRALAX / GLYCOLAX) packet Take 17 g by mouth daily. Patient taking differently: Take 17 g by mouth daily as needed for mild constipation.  09/24/15  Yes Delfina Redwood, MD  rosuvastatin (CRESTOR) 10 MG tablet Take 10 mg by mouth 3 (three) times a week. MWF   Yes [provider]  valsartan (DIOVAN) 160 MG tablet Take 160 mg by mouth daily. If systolic blood pressure greater than 140   Yes [provider]  methocarbamol (ROBAXIN) 500 MG tablet Take 1 tablet (500 mg total) by mouth 4 (four) times daily. Patient not taking: Reported on 06/22/2019 08/26/17   Mcarthur Rossetti, MD  methylPREDNISolone (MEDROL) 4 MG tablet Medrol dose pack. Take as instructed Patient not taking: Reported on 06/22/2019 08/26/17   Mcarthur Rossetti, MD    Family History Family History  Problem Relation Age of Onset  . Breast cancer Neg Hx     Social History Social History   Tobacco Use  . Smoking status: Never Smoker  . Smokeless tobacco: Never Used  Substance Use Topics  . Alcohol use: Yes    Comment: OCCASIONAL  . Drug use: No     Allergies   Codeine, Contrast media [iodinated diagnostic agents], Iodine, and Sodium tetradecyl sulfate   Review of Systems Review of Systems  Constitutional: Negative for chills and fever.  HENT: Negative for congestion.   Eyes: Negative for pain.  Respiratory: Positive for shortness of breath. Negative for cough.   Cardiovascular: Positive for chest pain. Negative for leg swelling.  Gastrointestinal: Negative for abdominal pain and vomiting.  Genitourinary: Negative for dysuria.  Musculoskeletal: Negative for myalgias.  Skin: Negative for rash.  Neurological: Negative for dizziness and headaches.     Physical Exam Updated Vital Signs BP (!) 175/80   Pulse Marland Kitchen)  52   Temp 98.1 F (36.7 C) (Oral)   Resp 17   Ht 5\' 4"  (1.626 m)   Wt 75 kg   SpO2 98%   BMI 28.38 kg/m   Physical Exam Vitals signs and nursing note reviewed.  Constitutional:      General: She is not in acute distress. HENT:     Head: Normocephalic and atraumatic.     Nose: Nose normal.     Mouth/Throat:     Mouth: Mucous membranes are moist.  Eyes:     General: No scleral icterus. Neck:     Musculoskeletal: Normal range of motion.     Comments: Moderate JVD with HOB at 30 degrees.   Cardiovascular:     Rate and Rhythm: Normal rate and regular rhythm.     Pulses: Normal pulses.     Heart sounds: Normal heart sounds.  Pulmonary:     Effort: Pulmonary effort is normal. No respiratory distress.     Breath sounds: Normal breath sounds. No wheezing.  Abdominal:     Palpations: Abdomen is soft.     Tenderness: There is no abdominal tenderness.  Musculoskeletal:     Right lower leg: No  edema.     Left lower leg: No edema.     Comments: No reproducible chest wall tenderness over the right anterior chest.  Skin:    General: Skin is warm and dry.     Capillary Refill: Capillary refill takes less than 2 seconds.  Neurological:     Mental Status: She is alert. Mental status is at baseline.  Psychiatric:        Mood and Affect: Mood normal.        Behavior: Behavior normal.      ED Treatments / Results  Labs (all labs ordered are listed, but only abnormal results are displayed) Labs Reviewed  BRAIN NATRIURETIC PEPTIDE - Abnormal; Notable for the following components:      Result Value   B Natriuretic Peptide 153.6 (*)    All other components within normal limits  SARS CORONAVIRUS 2 (TAT 6-24 HRS)  BASIC METABOLIC PANEL  CBC  TROPONIN I (HIGH SENSITIVITY)  TROPONIN I (HIGH SENSITIVITY)    EKG EKG Interpretation  Date/Time:  Thursday June 22 2019 18:35:28 EST Ventricular Rate:  54 PR Interval:  214 QRS Duration: 90 QT Interval:  459 QTC Calculation: 435 R Axis:   21 Text Interpretation: Sinus rhythm Low voltage, precordial leads Confirmed by Dene Gentry [phone]) on 06/22/2019 6:39:03 PM   Radiology Dg Chest 2 View  Result Date: 06/22/2019 CLINICAL DATA:  Chest pain EXAM: CHEST - 2 VIEW COMPARISON:  09/22/2015 chest radiograph. FINDINGS: Stable cardiomediastinal silhouette with normal heart size. No pneumothorax. No pleural effusion. Two new small nodular opacities in the peripheral lower right lung, largest 6 mm. No pulmonary edema. No acute consolidative airspace disease. IMPRESSION: Two new small nodular opacities in the peripheral lower right lung. Recommend chest CT for further evaluation. Electronically Signed   By: Ilona Sorrel M.D.   On: 06/22/2019 15:39    Procedures Procedures (including critical care time)  Medications Ordered in ED Medications  alum & mag hydroxide-simeth (MAALOX/MYLANTA) 200-200-20 MG/5ML suspension 30 mL (30 mLs Oral  Given 06/22/19 1822)    And  lidocaine (XYLOCAINE) 2 % viscous mouth solution 15 mL (15 mLs Oral Given 06/22/19 1822)  aspirin chewable tablet 162 mg (162 mg Oral Given 06/22/19 1822)     Initial Impression / Assessment and Plan /  ED Course  I have reviewed the triage vital signs and the nursing notes.  Pertinent labs & imaging results that were available during my care of the patient were reviewed by me and considered in my medical decision making (see chart for details).    HEAR Score: 5  Patient is a 84 year old female with history of HLD, HTN, MVP, PVD, TIA On Plavix.  Patient complains of constant, unremitting, sharp right sided chest pain with associated nausea, shortness of breath and left-sided chest pressure.  Denies any radiation but states that it is 7/10.  Denies exertional worsening.  Denies pleuritic component.  Doubt PE as patient has not tachypneic, tachycardic or hypoxic.  Doubt Boerhaave's.  Patient does have some JVD evident on physical exam however BNP is only mildly elevated doubt this is cause of symptoms.  No edema on chest x-ray.  No evidence of mediastinal widening and history is not concerning for thoracic aortic dissection.  No evidence of pneumothorax and lung sounds are clear bilaterally.  No evidence of pneumonia patient is afebrile.  Patient does endorse history of GERD however this is not similar to her reflux pain and has not been ameliorated by her usual over-the-counter's.  EKG is nonischemic x2.  Troponin next to within normal limits.  Given aspirin and GI cocktail.   My reassessment patient continues to have chest pressure that is unchanged.  Right-sided chest pain seems to be improved.  States is making a physician patient due to elevated heart score.  Patient would like to have hospitalist consulted.  7:31 PM discussed case with Dr. Marlowe Sax who will admit patient to floor for observation and further evaluation.   Final Clinical Impressions(s) / ED  Diagnoses   Final diagnoses:  Chest pain, unspecified type  Chest pressure    ED Discharge Orders    None       Tedd Sias, [place] 06/22/19 1932    Valarie Merino, MD 06/26/19 1037

## 2019-06-23 ENCOUNTER — Ambulatory Visit (HOSPITAL_COMMUNITY): Payer: Medicare Other | Attending: Internal Medicine

## 2019-06-23 ENCOUNTER — Observation Stay (HOSPITAL_COMMUNITY): Payer: Medicare Other

## 2019-06-23 DIAGNOSIS — R079 Chest pain, unspecified: Secondary | ICD-10-CM | POA: Insufficient documentation

## 2019-06-23 LAB — ECHOCARDIOGRAM COMPLETE
Height: 64 in
Weight: 2536 oz

## 2019-06-23 LAB — LIPID PANEL
Cholesterol: 213 mg/dL — ABNORMAL HIGH (ref 0–200)
HDL: 67 mg/dL (ref >40)
LDL Cholesterol: 128 mg/dL — ABNORMAL HIGH (ref 0–99)
Total CHOL/HDL Ratio: 3.2 RATIO
Triglycerides: 88 mg/dL (ref <150)
VLDL: 18 mg/dL (ref 0–40)

## 2019-06-23 LAB — TSH: TSH: 3.276 u[IU]/mL (ref 0.350–4.500)

## 2019-06-23 MED ORDER — PANTOPRAZOLE SODIUM 40 MG PO TBEC: 40.0000 mg | DELAYED_RELEASE_TABLET | Freq: Every day | ORAL | Status: AC

## 2019-06-23 NOTE — Progress Notes (Signed)
  Echocardiogram 2D Echocardiogram has been performed.  Lindsey Barnett 06/23/2019, 2:14 PM

## 2019-06-23 NOTE — Discharge Summary (Signed)
Physician Discharge Summary  Lindsey Barnett Spare [id number] DOB: [birth year] DOA: 06/22/2019  PCP: Josetta Huddle, MD  Admit date: 06/22/2019 Discharge date: 06/23/2019  Admitted From: home Discharge disposition: home   Recommendations for Outpatient Follow-Up:   1. Close Outpatient follow up 2. Have asked patient to take protonix daily for 14 days to see if symptoms re-occur  Discharge Diagnosis:   Principal Problem:   Chest pain Active Problems:   First degree AV block   Uncontrolled hypertension   HLD (hyperlipidemia)   PAD (peripheral artery disease) ([place])    Discharge Condition: Improved.  Diet recommendation: Low sodium, heart healthy  Wound care: None.  Code status: Full.   History of Present Illness:   Lindsey Barnett is a 84 y.o. female with medical history significant of hypertension, hyperlipidemia, mitral valve prolapse, peripheral arterial disease, TIA presenting with a chief complaint of chest pain.  Patient states last night she started having pain on the right side of her chest in her sleep.  This morning she woke up with substernal chest pain.  The pain is 5-8/10 in intensity.  She describes it as a burning pressure sensation.  Chest pain is not associated with dyspnea, diaphoresis, or nausea.  States during the day she was trying to do decorations and continued to have chest pain.  The chest pain has been persistent since yesterday.  She tried taking Protonix and Pepto-Bismol at home but they did not help.  Her chest pain is now better after receiving GI cocktail in the ED.  Denies history of CAD.  Denies history of PE.  Denies history of tobacco use.  Reports compliance with her home blood pressure medications.  Does report checking her blood pressure this morning and systolic was in the 0000000.  Reports taking Plavix daily for peripheral arterial disease but not aspirin because of causes skin bruising.   Hospital Course by Problem:   Chest  pain Appears atypical based on history but does have risk factors and heart score 5.   -Chest pain most likely GI related (GERD/dyspepsia) as patient did have some relief with GI cocktail.  ACS less likely given high-sensitivity troponin x2 negative and EKG x3 without acute ischemic changes.  PE less likely given no tachycardia or hypoxia (V.Q scan negative) -will ask her to take protonix daily for 14 days and see if symptoms resolved/re-occur  HTN -resume home meds -suspect BP elevated with pain/anxiety  Abnormal chest x-ray Chest x-ray showing 2 new small nodular opacities in the periphery of the right lower lung.  Patient is a non-smoker. -CT chest shows stable nodules- nothing worrisome  Hyperlipidemia -Continue Crestor -LDL: 128- adjustment per PCP  Peripheral arterial disease -Continue Plavix     Medical Consultants:      Discharge Exam:   Vitals:   06/23/19 0809 06/23/19 1048  BP: 124/74 (!) 148/69  Pulse:    Resp: 17 17  Temp: 98.6 F (37 C)   SpO2: 95% 99%   Vitals:   06/23/19 0342 06/23/19 0435 06/23/19 0809 06/23/19 1048  BP: (!) 166/75 (!) 177/64 124/74 (!) 148/69  Pulse:  65    Resp:  16 17 17   Temp:  98.5 F (36.9 C) 98.6 F (37 C)   TempSrc:  Oral Oral   SpO2:  98% 95% 99%  Weight:      Height:        General exam: Appears calm and comfortable.   The results of significant diagnostics from  this hospitalization (including imaging, microbiology, ancillary and laboratory) are listed below for reference.     Procedures and Diagnostic Studies:   Dg Chest 2 View  Result Date: 06/22/2019 CLINICAL DATA:  Chest pain EXAM: CHEST - 2 VIEW COMPARISON:  09/22/2015 chest radiograph. FINDINGS: Stable cardiomediastinal silhouette with normal heart size. No pneumothorax. No pleural effusion. Two new small nodular opacities in the peripheral lower right lung, largest 6 mm. No pulmonary edema. No acute consolidative airspace disease. IMPRESSION: Two new  small nodular opacities in the peripheral lower right lung. Recommend chest CT for further evaluation. Electronically Signed   By: Ilona Sorrel M.D.   On: 06/22/2019 15:39   Ct Chest Wo Contrast  Result Date: 06/23/2019 CLINICAL DATA:  Generalized chest pain for 2 days. Followup abnormal chest x-ray. EXAM: CT CHEST WITHOUT CONTRAST TECHNIQUE: Multidetector CT imaging of the chest was performed following the standard protocol without IV contrast. COMPARISON:  Chest x-ray 06/22/2019 and chest CT 10/20/2005. CT angio chest 06/24/2005. FINDINGS: Cardiovascular: The heart is normal in size. Small amount of pericardial fluid but no overt pericardial effusion. There is mild tortuosity, ectasia and calcification of the thoracic aorta. Scattered coronary artery calcifications are noted. Mediastinum/Nodes: No mediastinal or hilar mass or lymphadenopathy. The esophagus is grossly normal. Lungs/Pleura: Mild chronic parenchymal scarring type changes and minimal emphysematous changes. No acute pulmonary findings. No infiltrates or effusions. There are numerous scattered subpleural and peripheral pulmonary nodules, benign hepatic cysts. Hepatic cysts are noted. No worrisome hepatic lesions. The adrenal glands are unremarkable. Scattered aortic calcifications. Most of which are stable and unchanged since 2006. 5 mm right middle lobe subpleural nodule on image 103/4 is unchanged. 6 mm right middle lobe subpleural pulmonary nodule on image 103/4 is stable. 5 mm right lower lobe subpleural pulmonary nodule on image 120/4 is stable. 6 mm subpleural lingular nodule on image 106/4 is stable. 2.5 mm parenchymal nodule in the left upper lobe on image 82/4 is stable. No worrisome pulmonary lesions. Several other smaller nodules are stable. Upper Abdomen: Numerous benign-appearing hepatic cysts are noted. No worrisome hepatic lesions. The adrenal glands are normal. Scattered aortic calcifications. Musculoskeletal: No breast masses,  supraclavicular or axillary lymphadenopathy. The thyroid gland is grossly normal. No significant bony findings.  Mild pectus deformity. IMPRESSION: 1. Numerous small bilateral pulmonary nodules are unchanged since prior chest CTs from 2006 and 2007. No worrisome pulmonary lesions or acute pulmonary findings. 2. No mediastinal or hilar mass or adenopathy. 3. Scattered aortic and coronary artery calcifications. 4. Several benign-appearing hepatic cysts. Aortic Atherosclerosis (ICD10-I70.0) and Emphysema (ICD10-J43.9). Electronically Signed   By: Marijo Sanes M.D.   On: 06/23/2019 05:12   Nm Pulmonary Perfusion  Result Date: 06/23/2019 CLINICAL DATA:  Suspected pulmonary embolism. EXAM: NUCLEAR MEDICINE PERFUSION LUNG SCAN TECHNIQUE: Perfusion images were obtained in multiple projections after intravenous injection of radiopharmaceutical. Ventilation scans intentionally deferred if perfusion scan and chest x-ray adequate for interpretation during COVID 19 epidemic. RADIOPHARMACEUTICALS:  1.52 mCi Tc-3m MAA IV COMPARISON:  CT chest 06/23/2019 and chest radiograph 06/22/2019. FINDINGS: No wedge-shaped perfusion defects. IMPRESSION: No evidence of pulmonary embolus. Electronically Signed   By: Lorin Picket M.D.   On: 06/23/2019 10:26     Labs:   Basic Metabolic Panel: Recent Labs  Lab 06/22/19 1526  NA 140  K 4.2  CL 105  CO2 25  GLUCOSE 95  BUN 15  CREATININE 0.80  CALCIUM 9.1   GFR Estimated Creatinine Clearance: 52.7 mL/min (by C-G formula based on  SCr of 0.8 mg/dL). Liver Function Tests: No results for input(s): AST, ALT, ALKPHOS, BILITOT, PROT, ALBUMIN in the last 168 hours. No results for input(s): LIPASE, AMYLASE in the last 168 hours. No results for input(s): AMMONIA in the last 168 hours. Coagulation profile No results for input(s): INR, PROTIME in the last 168 hours.  CBC: Recent Labs  Lab 06/22/19 1526  WBC 7.6  HGB 13.7  HCT 42.0  MCV 91.7  PLT 247   Cardiac  Enzymes: No results for input(s): CKTOTAL, CKMB, CKMBINDEX, TROPONINI in the last 168 hours. BNP: Invalid input(s): POCBNP CBG: No results for input(s): GLUCAP in the last 168 hours. D-Dimer Recent Labs    06/22/19 2011  DDIMER 2.15*   Hgb A1c No results for input(s): HGBA1C in the last 72 hours. Lipid Profile Recent Labs    06/23/19 0426  CHOL 213*  HDL 67  LDLCALC 128*  TRIG 88  CHOLHDL 3.2   Thyroid function studies Recent Labs    06/23/19 0426  TSH 3.276   Anemia work up No results for input(s): VITAMINB12, FOLATE, FERRITIN, TIBC, IRON, RETICCTPCT in the last 72 hours. Microbiology Recent Results (from the past 240 hour(s))  SARS CORONAVIRUS 2 (TAT 6-24 HRS) Nasopharyngeal Nasopharyngeal Swab     Status: None   Collection Time: 06/22/19  6:32 PM   Specimen: Nasopharyngeal Swab  Result Value Ref Range Status   SARS Coronavirus 2 NEGATIVE NEGATIVE Final    Comment: (NOTE) SARS-CoV-2 target nucleic acids are NOT DETECTED. The SARS-CoV-2 RNA is generally detectable in upper and lower respiratory specimens during the acute phase of infection. Negative results do not preclude SARS-CoV-2 infection, do not rule out co-infections with other pathogens, and should not be used as the sole basis for treatment or other patient management decisions. Negative results must be combined with clinical observations, patient history, and epidemiological information. The expected result is Negative. Fact Sheet for Patients: SugarRoll.be Fact Sheet for Healthcare Providers: https://www.woods-mathews.com/ This test is not yet approved or cleared by the [place] FDA and  has been authorized for detection and/or diagnosis of SARS-CoV-2 by FDA under an Emergency Use Authorization (EUA). This EUA will remain  in effect (meaning this test can be used) for the duration of the COVID-19 declaration under Section 56 4(b)(1) of the Act, 21  U.S.C. section 360bbb-3(b)(1), unless the authorization is terminated or revoked sooner. Performed at [facility], [place] [address], [place], [place] [address]      Discharge Instructions:   Discharge Instructions    Diet - low sodium heart healthy   Complete by: As directed    Increase activity slowly   Complete by: As directed      Allergies as of 06/23/2019      Reactions   Codeine Other (See Comments)   STOMACHE SPASMS   Contrast Media [iodinated Diagnostic Agents] Hives   Iodine Other (See Comments)   Hives?   Sodium Tetradecyl Sulfate Other (See Comments)   unknown      Medication List    STOP taking these medications   methocarbamol 500 MG tablet Commonly known as: ROBAXIN   methylPREDNISolone 4 MG tablet Commonly known as: Medrol     TAKE these medications   acetaminophen 500 MG tablet Commonly known as: TYLENOL Take 1,000 mg by mouth every 6 (six) hours as needed for mild pain.   amoxicillin 500 MG capsule Commonly known as: AMOXIL Take 2,000 mg by mouth daily as needed (1 hour prior to  dental appointments).   aspirin 81 MG tablet Take 81 mg by mouth once a week.   Cholecalciferol 50 MCG (2000 UT) Caps Take 2,000 Units by mouth daily.   clopidogrel 75 MG tablet Commonly known as: PLAVIX Take 75 mg by mouth daily.   CoQ10 200 MG Caps Take 200 mg by mouth daily.   estradiol 0.5 MG tablet Commonly known as: ESTRACE Take 0.5 mg by mouth daily. Take for 2 weeks and off for 1 week   multivitamin tablet Take 1 tablet by mouth at bedtime.   pantoprazole 40 MG tablet Commonly known as: PROTONIX Take 1 tablet (40 mg total) by mouth daily. What changed:   when to take this  reasons to take this   polyethylene glycol 17 g packet Commonly known as: MIRALAX / GLYCOLAX Take 17 g by mouth daily. What changed:   when to take this  reasons to take this   rosuvastatin 10 MG tablet Commonly known as: CRESTOR Take 10 mg by mouth 3  (three) times a week. MWF   valsartan 160 MG tablet Commonly known as: DIOVAN Take 160 mg by mouth daily. If systolic blood pressure greater than 140      Follow-up Information    Josetta Huddle, MD Follow up in 1 week(s).   Specialty: Internal Medicine Contact information: [address] [place] [address] [phone]            Time coordinating discharge: 35 min  Signed:  Geradine Girt DO  [facility] 06/23/2019, 4:09 PM

## 2019-06-23 NOTE — Discharge Instructions (Signed)
V/Q scan was negative for PE Echo shows your heart pumps well CT Scan of lung showed stable nodules Labs reassuring I would try taking protonix daily for 2 weeks instead of PRN and see if any re-occurrence

## 2019-06-23 NOTE — Progress Notes (Signed)
Nsg Discharge Note  Admit Date:  06/22/2019 Discharge date: 06/23/2019   Evyn Denney Hinojosa to be D/C'd home per MD order.  AVS completed. Patient/caregiver able to verbalize understanding.  Discharge Medication: Allergies as of 06/23/2019      Reactions   Codeine Other (See Comments)   STOMACHE SPASMS   Contrast Media [iodinated Diagnostic Agents] Hives   Iodine Other (See Comments)   Hives?   Sodium Tetradecyl Sulfate Other (See Comments)   unknown      Medication List    STOP taking these medications   methocarbamol 500 MG tablet Commonly known as: ROBAXIN   methylPREDNISolone 4 MG tablet Commonly known as: Medrol     TAKE these medications   acetaminophen 500 MG tablet Commonly known as: TYLENOL Take 1,000 mg by mouth every 6 (six) hours as needed for mild pain.   amoxicillin 500 MG capsule Commonly known as: AMOXIL Take 2,000 mg by mouth daily as needed (1 hour prior to dental appointments).   aspirin 81 MG tablet Take 81 mg by mouth once a week.   Cholecalciferol 50 MCG (2000 UT) Caps Take 2,000 Units by mouth daily.   clopidogrel 75 MG tablet Commonly known as: PLAVIX Take 75 mg by mouth daily.   CoQ10 200 MG Caps Take 200 mg by mouth daily.   estradiol 0.5 MG tablet Commonly known as: ESTRACE Take 0.5 mg by mouth daily. Take for 2 weeks and off for 1 week   multivitamin tablet Take 1 tablet by mouth at bedtime.   pantoprazole 40 MG tablet Commonly known as: PROTONIX Take 1 tablet (40 mg total) by mouth daily. What changed:   when to take this  reasons to take this   polyethylene glycol 17 g packet Commonly known as: MIRALAX / GLYCOLAX Take 17 g by mouth daily. What changed:   when to take this  reasons to take this   rosuvastatin 10 MG tablet Commonly known as: CRESTOR Take 10 mg by mouth 3 (three) times a week. MWF   valsartan 160 MG tablet Commonly known as: DIOVAN Take 160 mg by mouth daily. If systolic blood pressure greater  than 140       Discharge Assessment: Vitals:   06/23/19 0809 06/23/19 1048  BP: 124/74 (!) 148/69  Pulse:    Resp: 17 17  Temp: 98.6 F (37 C)   SpO2: 95% 99%   Skin clean, dry and intact without evidence of skin break down, no evidence of skin tears noted. IV catheter discontinued intact. Site without signs and symptoms of complications - no redness or edema noted at insertion site, patient denies c/o pain - only slight tenderness at site.  Dressing with slight pressure applied.  D/c Instructions-Education: Discharge instructions given to patient/family with verbalized understanding. D/c education completed with patient/family including follow up instructions, medication list, d/c activities limitations if indicated, with other d/c instructions as indicated by MD - patient able to verbalize understanding, all questions fully answered. Patient instructed to return to ED, call 911, or call MD for any changes in condition.  Patient escorted via [place], and D/C home via private auto.  Atilano Ina, RN 06/23/2019 6:01 PM

## 2019-06-28 ENCOUNTER — Ambulatory Visit: Payer: Medicare Other | Admitting: Physical Therapy

## 2019-06-28 ENCOUNTER — Encounter: Payer: Self-pay | Admitting: Physical Therapy

## 2019-06-28 ENCOUNTER — Other Ambulatory Visit: Payer: Self-pay

## 2019-06-28 DIAGNOSIS — M25572 Pain in left ankle and joints of left foot: Secondary | ICD-10-CM | POA: Diagnosis not present

## 2019-06-28 DIAGNOSIS — M25672 Stiffness of left ankle, not elsewhere classified: Secondary | ICD-10-CM

## 2019-06-28 DIAGNOSIS — R2689 Other abnormalities of gait and mobility: Secondary | ICD-10-CM | POA: Diagnosis not present

## 2019-06-28 NOTE — Therapy (Signed)
[facility] Physical Therapy [address] [place], [place], [address] Phone: [phone]   Fax:  [phone]  Physical Therapy Evaluation  Patient Details  Name: Lindsey Barnett MRN: [id number] Date of Birth: 1934/12/14 Referring Provider (PT): Mcarthur Rossetti, MD   Encounter Date: 06/28/2019  PT End of Session - 06/28/19 2113    Visit Number  1    Number of Visits  12    Date for PT Re-Evaluation  08/09/19    Authorization Type  [employer] MCR, progress note at 10    PT Start Time  1315    PT Stop Time  1405    PT Time Calculation (min)  50 min    Activity Tolerance  Patient tolerated treatment well    Behavior During Therapy  [facility] for tasks assessed/performed       Past Medical History:  Diagnosis Date  . Acute meniscal tear, lateral RIGHT  . Arthritis HIP  . Cataract immature   . GERD (gastroesophageal reflux disease) OCCASIONAL  . History of TIA (transient ischemic attack) 2005--  NO RESIDUAL  . Hyperlipidemia   . Hypertension   . MVP (mitral valve prolapse)   . Peripheral vascular disease ([place])   . Stress incontinence   . Swelling of right knee joint     Past Surgical History:  Procedure Laterality Date  . ANTERIOR APPROACH HEMI HIP ARTHROPLASTY Right 09/22/2015   Procedure: ANTERIOR APPROACH HEMI HIP ARTHROPLASTY;  Surgeon: Dorna Leitz, MD;  Location: [facility];  Service: Orthopedics;  Laterality: Right;  . BREAST CYST EXCISION Right   . CHOLECYSTECTOMY  1975  . KNEE ARTHROSCOPY  09/16/2011   Procedure: ARTHROSCOPY KNEE;  Surgeon: Gearlean Alf, MD;  Location: [facility];  Service: Orthopedics;  Laterality: Right;  WITH lateral meniscal debridement  . TOTAL HIP ARTHROPLASTY Right 03/06/2016   Procedure: CONVERSION RIGHT HIP HEMIARTHROPLASTY TO RIGHT TOTAL HIP ARTHROPLASTY ANTERIOR APPROACH;  Surgeon: Mcarthur Rossetti, MD;  Location: [facility];  Service: Orthopedics;  Laterality: Right;  Marland Kitchen VAGINAL HYSTERECTOMY  1973     There were no vitals filed for this visit.   Subjective Assessment - 06/28/19 1325    Subjective  Relays Lt heel/foot pain since March 2020. MD diagnosed with plantar fasiciits. She has tried new shoes and inserts and stretching which has helped a little but not that much. She tried injection which helped for about a day but pain is back. Pain is worse first thing in the morning. Of note she did have recent hospitalzatoin for chest pain 06/22/19 (she denies any chest pain today)    Pertinent History  PMH: lumbar pain,hypertension, hyperlipidemia, mitral valve prolapse, peripheral arterial disease, TIA, Rt hip THA    How long can you walk comfortably?  cant walk first thing in the morning, has to walk on toes because her heel hurts too bad    Patient Stated Goals  get the pain down    Currently in Pain?  Yes    Pain Score  5    currently 5 but is a 10 in the mornings   Pain Location  Heel    Pain Orientation  Left    Pain Descriptors / Indicators  Aching    Pain Type  Chronic pain    Pain Frequency  Intermittent    Pain Relieving Factors  minor relief with stretching    Multiple Pain Sites  No         [facility] PT Assessment - 06/28/19 0001  Assessment   Medical Diagnosis  Pain in Left foot/heel, plantar fasciitis.    Referring Provider (PT)  Mcarthur Rossetti, MD    Onset Date/Surgical Date  --   pain since March 2020   Next MD Visit  07/10/19    Prior Therapy  not for heel      Precautions   Precautions  None      Restrictions   Weight Bearing Restrictions  No      Balance Screen   Has the patient fallen in the past 6 months  No      [place] residence      Prior Function   Level of Independence  Independent    Vocation  Retired      Associate Professor   Overall Cognitive Status  Within Functional Limits for tasks assessed      Sensation   Light Touch  Appears Intact      Coordination   Gross Motor Movements are Fluid and  Coordinated  Yes      Posture/Postural Control   Posture Comments  Rt knee valgus and rearfoot valgus with inc pronation and increased toe out with standing and gait      ROM / Strength   AROM / PROM / Strength  AROM;Strength      AROM   Overall AROM Comments  ankle ROM WNL except DF limited to neutral      Strength   Overall Strength Comments  Lt ankle 4+ plantarflexion,inversion, eversion, 5/5 DF 4/5 Lt hip abduction      Flexibility   Soft Tissue Assessment /Muscle Length  --   mod to severe tight heelcords on Lt     Palpation   Palpation comment  TTP in Lt heel and plantar fascia      Special Tests   Other special tests  neg thompson test      Transfers   Transfers  Independent with all Transfers      Ambulation/Gait   Gait Comments  ambulates without AD independently but increased Lt knee valgus, rearfoot valgus, and pronation                Objective measurements completed on examination: See above findings.      [place] Adult PT Treatment/Exercise - 06/28/19 0001      Exercises   Exercises  Ankle      Modalities   Modalities  Moist Heat      Moist Heat Therapy   Number Minutes Moist Heat  5 Minutes    Moist Heat Location  --   Lt heel, plantar fascia     Manual Therapy   Manual therapy comments  STM/IASTM to Lt plantarfascia, achillies, gastroc-soleus      Ankle Exercises: Stretches   Plantar Fascia Stretch  30 seconds;2 reps    Plantar Fascia Stretch Limitations  one reps sitting, one rep standing with towel roll under toes    Soleus Stretch  30 seconds;2 reps    Soleus Stretch Limitations  one rep long sitting, one rep standing    Gastroc Stretch  2 reps;30 seconds    Gastroc Stretch Limitations  one rep long sitting, one rep standing             PT Education - 06/28/19 2112    Education Details  HEP, POC, self care for STM/IASTM at home, to stretch before getting out of bed and to not walk barefoot first thing in the  morning .     Person(s) Educated  Patient    Methods  Explanation;Demonstration;Verbal cues;Handout    Comprehension  Verbalized understanding;Returned demonstration;Need further instruction          PT Long Term Goals - 06/28/19 2124      PT LONG TERM GOAL #1   Title  Pt will be I and compliant with HEP. (target for all LTG 6 weeks 08/09/19)    Status  New      PT LONG TERM GOAL #2   Title  Pt will reduce overall Lt heel pain to less than 2-3/10 with ambulaiton and morning activity.    Status  New      PT LONG TERM GOAL #3   Title  Pt will improve ankle DF to at least 5 deg to show reduced tightness in heelcords to improve overall gait/function    Status  New             Plan - 06/28/19 2114    Clinical Impression Statement  Pt presents with Lt hee and archl pain consistent with chronic plantarfasciitis since March 2020. She has soft tissue restricitons and tighntess in heel cords and plantarfascia limiting ROM and increasing pain. She has overall increased pain and tighntess first thing in the morning,decreased ankle strength, and abnormal gait/posture with increased Lt knee valgus, increased rearfoot valgus and increased pronation. She will benefit from skilled PT to address her deficits.    Personal Factors and Comorbidities  Comorbidity 3+;Time since onset of injury/illness/exacerbation    Comorbidities  PMH: lumbar pain,hypertension, hyperlipidemia, mitral valve prolapse, peripheral arterial disease, TIA, Rt hip THA    Examination-Activity Limitations  Locomotion Level    Examination-Participation Restrictions  Community Activity;Shop    Stability/Clinical Decision Making  Stable/Uncomplicated    Clinical Decision Making  Moderate    Rehab Potential  Good    PT Frequency  2x / week    PT Duration  6 weeks    PT Treatment/Interventions  ADLs/Self Care Home Management;Aquatic Therapy;Cryotherapy;Iontophoresis 4mg /ml Dexamethasone;Moist Heat;Ultrasound;Neuromuscular re-education;Balance  training;Therapeutic exercise;Therapeutic activities;Stair training;Gait training;Patient/family education;Manual techniques;Dry needling;Passive range of motion;Taping    PT Next Visit Plan  review HEP, needs stretching for heel cords and plantar fascia, STM/IASTM, consider DN or modalaties for pain    PT Home Exercise Plan  Access Code: [email or web address]    Consulted and Agree with Plan of Care  Patient       Patient will benefit from skilled therapeutic intervention in order to improve the following deficits and impairments:  Abnormal gait, Decreased activity tolerance, Decreased balance, Decreased mobility, Decreased range of motion, Decreased strength, Difficulty walking, Increased fascial restricitons, Increased muscle spasms, Postural dysfunction, Pain, Impaired flexibility  Visit Diagnosis: Pain in left ankle and joints of left foot  Stiffness of left ankle, not elsewhere classified  Other abnormalities of gait and mobility     Problem List Patient Active Problem List   Diagnosis Date Noted  . Chest pain 06/22/2019  . First degree AV block 06/22/2019  . Uncontrolled hypertension 06/22/2019  . HLD (hyperlipidemia) 06/22/2019  . PAD (peripheral artery disease) ([place]) 06/22/2019  . Impingement syndrome of right shoulder 12/22/2017  . Sciatica, left side 09/08/2017  . Pain due to right hip joint prosthesis ([place]) 03/06/2016  . 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 Hip fracture requiring operative repair ([place]) 09/22/2015  . Hip injury   . Subcapital fracture of hip ([place]) 09/21/2015  . Hypokalemia 09/21/2015  . HTN (hypertension) 09/21/2015  .  GERD (gastroesophageal reflux disease) 09/21/2015  . Lateral meniscal tear 09/16/2011    Debbe Odea ,PT,DPT 06/28/2019, 9:28 PM  [facility] Physical Therapy [address] [place], [place], [address] Phone: [phone]   Fax:  [phone]  Name: Blerta Antwine MRN: [id number] Date of Birth:  Mar 10, 1935

## 2019-06-28 NOTE — Patient Instructions (Signed)
Access Code: [email or web address]  URL: [email or web address]  Date: 06/28/2019  Prepared by: Elsie Ra   Exercises  Supine Calf Stretch with Strap - 3 reps - 30 hold - 2x daily - 6x weekly  Seated Plantar Fascia Stretch - 3 reps - 1 sets - 30 hold - 2x daily - 6x weekly  Gastroc Stretch on Wall - 2-3 reps - 1 sets - 30 hold - 1x daily - 3x weekly  Soleus Stretch on Wall - 2-3 sets - 30 hold - 2x daily - 6x weekly  Seated Plantar Fascia Mobilization with Small Ball - 1 reps - 1 sets - 3 min hold - 2x daily - 6x weekly  Standing Bilateral Heel Raise on Step - 10 reps - 3 sets - 2x daily - 6x weekly

## 2019-07-10 ENCOUNTER — Ambulatory Visit: Payer: Medicare Other | Admitting: Orthopaedic Surgery

## 2019-07-25 ENCOUNTER — Encounter: Payer: Self-pay | Admitting: Physical Therapy

## 2019-07-25 ENCOUNTER — Ambulatory Visit: Payer: Medicare Other | Admitting: Physical Therapy

## 2019-07-25 ENCOUNTER — Other Ambulatory Visit: Payer: Self-pay

## 2019-07-25 DIAGNOSIS — M25672 Stiffness of left ankle, not elsewhere classified: Secondary | ICD-10-CM

## 2019-07-25 DIAGNOSIS — M25572 Pain in left ankle and joints of left foot: Secondary | ICD-10-CM

## 2019-07-25 DIAGNOSIS — R2689 Other abnormalities of gait and mobility: Secondary | ICD-10-CM | POA: Diagnosis not present

## 2019-07-25 NOTE — Therapy (Signed)
[facility] Physical Therapy [address] [place], [place], [address] Phone: [phone]   Fax:  [phone]  Physical Therapy Treatment  Patient Details  Name: Lindsey Barnett MRN: [id number] Date of Birth: Jul 03, 1935 Referring Provider (PT): Mcarthur Rossetti, MD   Encounter Date: 07/25/2019  PT End of Session - 07/25/19 1612    Visit Number  2    Number of Visits  12    Date for PT Re-Evaluation  08/09/19    Authorization Type  [employer] MCR, progress note at 10    PT Start Time  1457   pt arrived late   PT Stop Time  1529    PT Time Calculation (min)  32 min    Activity Tolerance  Patient tolerated treatment well    Behavior During Therapy  [facility] for tasks assessed/performed       Past Medical History:  Diagnosis Date  . Acute meniscal tear, lateral RIGHT  . Arthritis HIP  . Cataract immature   . GERD (gastroesophageal reflux disease) OCCASIONAL  . History of TIA (transient ischemic attack) 2005--  NO RESIDUAL  . Hyperlipidemia   . Hypertension   . MVP (mitral valve prolapse)   . Peripheral vascular disease ([place])   . Stress incontinence   . Swelling of right knee joint     Past Surgical History:  Procedure Laterality Date  . ANTERIOR APPROACH HEMI HIP ARTHROPLASTY Right 09/22/2015   Procedure: ANTERIOR APPROACH HEMI HIP ARTHROPLASTY;  Surgeon: Dorna Leitz, MD;  Location: [facility];  Service: Orthopedics;  Laterality: Right;  . BREAST CYST EXCISION Right   . CHOLECYSTECTOMY  1975  . KNEE ARTHROSCOPY  09/16/2011   Procedure: ARTHROSCOPY KNEE;  Surgeon: Gearlean Alf, MD;  Location: [facility];  Service: Orthopedics;  Laterality: Right;  WITH lateral meniscal debridement  . TOTAL HIP ARTHROPLASTY Right 03/06/2016   Procedure: CONVERSION RIGHT HIP HEMIARTHROPLASTY TO RIGHT TOTAL HIP ARTHROPLASTY ANTERIOR APPROACH;  Surgeon: Mcarthur Rossetti, MD;  [facility];  Service: Orthopedics;  Laterality: Right;  Marland Kitchen VAGINAL HYSTERECTOMY   1973    There were no vitals filed for this visit.  Subjective Assessment - 07/25/19 1501    Subjective  doesn't feel any better; pain is worse at night    Pertinent History  PMH: lumbar pain,hypertension, hyperlipidemia, mitral valve prolapse, peripheral arterial disease, TIA, Rt hip THA    How long can you walk comfortably?  cant walk first thing in the morning, has to walk on toes because her heel hurts too bad    Patient Stated Goals  get the pain down    Currently in Pain?  Yes    Pain Score  5     Pain Location  Heel    Pain Orientation  Left    Pain Descriptors / Indicators  Aching    Pain Type  Chronic pain    Pain Onset  More than a month ago    Pain Frequency  Intermittent    Aggravating Factors   worse in AM, night time    Pain Relieving Factors  minor relief with stretching                       [facility] Adult PT Treatment/Exercise - 07/25/19 1501      Manual Therapy   Manual therapy comments  STM/IASTM to Lt plantarfascia, achillies, gastroc-soleus      Ankle Exercises: Aerobic   Recumbent Bike  L1 x 4 min  Ankle Exercises: Stretches   Soleus Stretch  30 seconds;2 reps    Gastroc Stretch  2 reps;30 seconds       Trigger Point Dry Needling - 07/25/19 1612    Consent Given?  Yes    Education Handout Provided  Yes    Muscles Treated Lower Quadrant  Gastrocnemius;Soleus    Gastrocnemius Response  Twitch response elicited;Palpable increased muscle length    Soleus Response  Twitch response elicited;Palpable increased muscle length           PT Education - 07/25/19 1612    Education Details  DN    Person(s) Educated  Patient    Methods  Explanation;Handout    Comprehension  Verbalized understanding          PT Long Term Goals - 06/28/19 2124      PT LONG TERM GOAL #1   Title  Pt will be I and compliant with HEP. (target for all LTG 6 weeks 08/09/19)    Status  New      PT LONG TERM GOAL #2   Title  Pt will reduce overall Lt  heel pain to less than 2-3/10 with ambulaiton and morning activity.    Status  New      PT LONG TERM GOAL #3   Title  Pt will improve ankle DF to at least 5 deg to show reduced tightness in heelcords to improve overall gait/function    Status  New            Plan - 07/25/19 1613    Clinical Impression Statement  Pt arrived late to session and reported no significant improvement in symptoms.  Discussed consistent stretching with trial of DN/manual therapy today to see if pt has better improvement.  No goals met as only 2nd visit.    Personal Factors and Comorbidities  Comorbidity 3+;Time since onset of injury/illness/exacerbation    Comorbidities  PMH: lumbar pain,hypertension, hyperlipidemia, mitral valve prolapse, peripheral arterial disease, TIA, Rt hip THA    Examination-Activity Limitations  Locomotion Level    Examination-Participation Restrictions  Community Activity;Shop    Stability/Clinical Decision Making  Stable/Uncomplicated    Rehab Potential  Good    PT Frequency  2x / week    PT Duration  6 weeks    PT Treatment/Interventions  ADLs/Self Care Home Management;Aquatic Therapy;Cryotherapy;Iontophoresis 53m/ml Dexamethasone;Moist Heat;Ultrasound;Neuromuscular re-education;Balance training;Therapeutic exercise;Therapeutic activities;Stair training;Gait training;Patient/family education;Manual techniques;Dry needling;Passive range of motion;Taping    PT Next Visit Plan  review HEP, needs stretching for heel cords and plantar fascia, STM/IASTM, assess response to DN    PT Home Exercise Plan  Access Code: [email or web address]   Consulted and Agree with Plan of Care  Patient       Patient will benefit from skilled therapeutic intervention in order to improve the following deficits and impairments:  Abnormal gait, Decreased activity tolerance, Decreased balance, Decreased mobility, Decreased range of motion, Decreased strength, Difficulty walking, Increased fascial restricitons, Increased  muscle spasms, Postural dysfunction, Pain, Impaired flexibility  Visit Diagnosis: Pain in left ankle and joints of left foot  Stiffness of left ankle, not elsewhere classified  Other abnormalities of gait and mobility     Problem List Patient Active Problem List   Diagnosis Date Noted  . Chest pain 06/22/2019  . First degree AV block 06/22/2019  . Uncontrolled hypertension 06/22/2019  . HLD (hyperlipidemia) 06/22/2019  . PAD (peripheral artery disease) ([place] 06/22/2019  . Impingement syndrome of right shoulder 12/22/2017  . Sciatica, left side  09/08/2017  . Pain due to right hip joint prosthesis ([place]) 03/06/2016  . 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 Hip fracture requiring operative repair ([place]) 09/22/2015  . Hip injury   . Subcapital fracture of hip ([place]) 09/21/2015  . Hypokalemia 09/21/2015  . HTN (hypertension) 09/21/2015  . GERD (gastroesophageal reflux disease) 09/21/2015  . Lateral meniscal tear 09/16/2011      Laureen Abrahams, PT, DPT 07/25/19 4:16 PM     [facility] Physical Therapy [address] [place], [place], [address] Phone: [phone]   Fax:  [phone]  Name: Lindsey Barnett MRN: [id number] Date of Birth: 1934/09/26

## 2019-07-25 NOTE — Patient Instructions (Signed)

## 2019-07-27 ENCOUNTER — Ambulatory Visit: Payer: Medicare Other | Admitting: Physical Therapy

## 2019-07-27 ENCOUNTER — Other Ambulatory Visit: Payer: Self-pay

## 2019-07-27 ENCOUNTER — Encounter: Payer: Self-pay | Admitting: Physical Therapy

## 2019-07-27 DIAGNOSIS — M25672 Stiffness of left ankle, not elsewhere classified: Secondary | ICD-10-CM | POA: Diagnosis not present

## 2019-07-27 DIAGNOSIS — M25572 Pain in left ankle and joints of left foot: Secondary | ICD-10-CM | POA: Diagnosis not present

## 2019-07-27 DIAGNOSIS — R2689 Other abnormalities of gait and mobility: Secondary | ICD-10-CM | POA: Diagnosis not present

## 2019-07-27 NOTE — Therapy (Signed)
[facility] [address] [place], [place], [address] Phone: [phone]   Fax:  [phone]  Physical Therapy Treatment  Patient Details  Name: Lindsey Barnett MRN: [id number] Date of Birth: 08-16-1934 Referring Provider (PT): Mcarthur Rossetti, MD   Encounter Date: 07/27/2019  PT End of Session - 07/27/19 1437    Visit Number  3    Number of Visits  12    Date for PT Re-Evaluation  08/09/19    Authorization Type  UHC MCR, progress note at 10    PT Start Time  1358    PT Stop Time  1437    PT Time Calculation (min)  39 min    Activity Tolerance  Patient tolerated treatment well    Behavior During Therapy  [facility] for tasks assessed/performed       Past Medical History:  Diagnosis Date  . Acute meniscal tear, lateral RIGHT  . Arthritis HIP  . Cataract immature   . GERD (gastroesophageal reflux disease) OCCASIONAL  . History of TIA (transient ischemic attack) 2005--  NO RESIDUAL  . Hyperlipidemia   . Hypertension   . MVP (mitral valve prolapse)   . Peripheral vascular disease ([place])   . Stress incontinence   . Swelling of right knee joint     Past Surgical History:  Procedure Laterality Date  . ANTERIOR APPROACH HEMI HIP ARTHROPLASTY Right 09/22/2015   Procedure: ANTERIOR APPROACH HEMI HIP ARTHROPLASTY;  Surgeon: Dorna Leitz, MD;  Location: [place];  Service: Orthopedics;  Laterality: Right;  . BREAST CYST EXCISION Right   . CHOLECYSTECTOMY  1975  . KNEE ARTHROSCOPY  09/16/2011   Procedure: ARTHROSCOPY KNEE;  Surgeon: Gearlean Alf, MD;  Location: [facility];  Service: Orthopedics;  Laterality: Right;  WITH lateral meniscal debridement  . TOTAL HIP ARTHROPLASTY Right 03/06/2016   Procedure: CONVERSION RIGHT HIP HEMIARTHROPLASTY TO RIGHT TOTAL HIP ARTHROPLASTY ANTERIOR APPROACH;  Surgeon: Mcarthur Rossetti, MD;  [facility];  Service: Orthopedics;  Laterality: Right;  Marland Kitchen VAGINAL HYSTERECTOMY  1973    There  were no vitals filed for this visit.  Subjective Assessment - 07/27/19 1401    Subjective  feels maybe "slightly better."  wearing shoes at night is helpful    Pertinent History  PMH: lumbar pain,hypertension, hyperlipidemia, mitral valve prolapse, peripheral arterial disease, TIA, Rt hip THA    How long can you walk comfortably?  cant walk first thing in the morning, has to walk on toes because her heel hurts too bad    Patient Stated Goals  get the pain down    Currently in Pain?  Yes    Pain Score  7     Pain Location  Heel    Pain Orientation  Left    Pain Descriptors / Indicators  Aching    Pain Type  Chronic pain    Pain Onset  More than a month ago    Pain Frequency  Intermittent    Aggravating Factors   worse in AM, getting up a night    Pain Relieving Factors  minor relief with stretching                       [facility] Adult PT Treatment/Exercise - 07/27/19 1403      Manual Therapy   Manual therapy comments  STM/IASTM to Lt plantarfascia, achillies, gastroc-soleus      Ankle Exercises: Aerobic   Recumbent Bike  L2 x 6 min  Ankle Exercises: Stretches   Soleus Stretch  30 seconds;2 reps    Gastroc Stretch  2 reps;30 seconds      Ankle Exercises: Seated   Ankle Circles/Pumps  Left;15 reps   circles CW/CCW   Other Seated Ankle Exercises  Lt arch lifts x 15 reps                  PT Long Term Goals - 06/28/19 2124      PT LONG TERM GOAL #1   Title  Pt will be I and compliant with HEP. (target for all LTG 6 weeks 08/09/19)    Status  New      PT LONG TERM GOAL #2   Title  Pt will reduce overall Lt heel pain to less than 2-3/10 with ambulaiton and morning activity.    Status  New      PT LONG TERM GOAL #3   Title  Pt will improve ankle DF to at least 5 deg to show reduced tightness in heelcords to improve overall gait/function    Status  New            Plan - 07/27/19 1437    Clinical Impression Statement  Pt reported decreased  pain following today's session, and reinforced need for consistency with HEP at home to maximize progress.  Will continue to benefit from PT to maximize function.    Personal Factors and Comorbidities  Comorbidity 3+;Time since onset of injury/illness/exacerbation    Comorbidities  PMH: lumbar pain,hypertension, hyperlipidemia, mitral valve prolapse, peripheral arterial disease, TIA, Rt hip THA    Examination-Activity Limitations  Locomotion Level    Examination-Participation Restrictions  Community Activity;Shop    Stability/Clinical Decision Making  Stable/Uncomplicated    Rehab Potential  Good    PT Frequency  2x / week    PT Duration  6 weeks    PT Treatment/Interventions  ADLs/Self Care Home Management;Aquatic Therapy;Cryotherapy;Iontophoresis 4mg /ml Dexamethasone;Moist Heat;Ultrasound;Neuromuscular re-education;Balance training;Therapeutic exercise;Therapeutic activities;Stair training;Gait training;Patient/family education;Manual techniques;Dry needling;Passive range of motion;Taping    PT Next Visit Plan  review HEP, needs stretching for heel cords and plantar fascia, STM/IASTM, may need additional DN sessions    PT Home Exercise Plan  Access Code: [email or web address]    Consulted and Agree with Plan of Care  Patient       Patient will benefit from skilled therapeutic intervention in order to improve the following deficits and impairments:  Abnormal gait, Decreased activity tolerance, Decreased balance, Decreased mobility, Decreased range of motion, Decreased strength, Difficulty walking, Increased fascial restricitons, Increased muscle spasms, Postural dysfunction, Pain, Impaired flexibility  Visit Diagnosis: Pain in left ankle and joints of left foot  Stiffness of left ankle, not elsewhere classified  Other abnormalities of gait and mobility     Problem List Patient Active Problem List   Diagnosis Date Noted  . Chest pain 06/22/2019  . First degree AV block 06/22/2019  . Uncontrolled  hypertension 06/22/2019  . HLD (hyperlipidemia) 06/22/2019  . PAD (peripheral artery disease) ([place]) 06/22/2019  . Impingement syndrome of right shoulder 12/22/2017  . Sciatica, left side 09/08/2017  . Pain due to right hip joint prosthesis ([place]) 03/06/2016  . 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 Hip fracture requiring operative repair ([place]) 09/22/2015  . Hip injury   . Subcapital fracture of hip ([place]) 09/21/2015  . Hypokalemia 09/21/2015  . HTN (hypertension) 09/21/2015  . GERD (gastroesophageal reflux disease) 09/21/2015  . Lateral meniscal tear 09/16/2011  Laureen Abrahams, PT, DPT 07/27/19 3:26 PM      [facility] [address] [place], [place], [address] Phone: [phone]   Fax:  [phone]  Name: Lindsey Barnett MRN: [id number] Date of Birth: [birth year]

## 2019-07-31 ENCOUNTER — Other Ambulatory Visit: Payer: Self-pay

## 2019-07-31 ENCOUNTER — Ambulatory Visit (INDEPENDENT_AMBULATORY_CARE_PROVIDER_SITE_OTHER): Payer: Medicare Other | Admitting: Physical Therapy

## 2019-07-31 DIAGNOSIS — R2689 Other abnormalities of gait and mobility: Secondary | ICD-10-CM

## 2019-07-31 DIAGNOSIS — M25572 Pain in left ankle and joints of left foot: Secondary | ICD-10-CM | POA: Diagnosis not present

## 2019-07-31 DIAGNOSIS — M25672 Stiffness of left ankle, not elsewhere classified: Secondary | ICD-10-CM | POA: Diagnosis not present

## 2019-07-31 NOTE — Therapy (Signed)
[facility] [address] [place], [place], [address] Phone: [phone]   Fax:  [phone]  Physical Therapy Treatment  Patient Details  Name: Lindsey Barnett MRN: [id number] Date of Birth: July 22, 1934 Referring Provider (PT): Mcarthur Rossetti, MD   Encounter Date: 07/31/2019  PT End of Session - 07/31/19 1120    Visit Number  4    Number of Visits  12    Date for PT Re-Evaluation  08/09/19    Authorization Type  UHC MCR, progress note at 10    PT Start Time  0930    PT Stop Time  1020    PT Time Calculation (min)  50 min    Activity Tolerance  Patient tolerated treatment well    Behavior During Therapy  [facility] for tasks assessed/performed       Past Medical History:  Diagnosis Date  . Acute meniscal tear, lateral RIGHT  . Arthritis HIP  . Cataract immature   . GERD (gastroesophageal reflux disease) OCCASIONAL  . History of TIA (transient ischemic attack) 2005--  NO RESIDUAL  . Hyperlipidemia   . Hypertension   . MVP (mitral valve prolapse)   . Peripheral vascular disease ([place])   . Stress incontinence   . Swelling of right knee joint     Past Surgical History:  Procedure Laterality Date  . ANTERIOR APPROACH HEMI HIP ARTHROPLASTY Right 09/22/2015   Procedure: ANTERIOR APPROACH HEMI HIP ARTHROPLASTY;  Surgeon: Dorna Leitz, MD;  Location: [place];  Service: Orthopedics;  Laterality: Right;  . BREAST CYST EXCISION Right   . CHOLECYSTECTOMY  1975  . KNEE ARTHROSCOPY  09/16/2011   Procedure: ARTHROSCOPY KNEE;  Surgeon: Gearlean Alf, MD;  Location[facility];  Service: Orthopedics;  Laterality: Right;  WITH lateral meniscal debridement  . TOTAL HIP ARTHROPLASTY Right 03/06/2016   Procedure: CONVERSION RIGHT HIP HEMIARTHROPLASTY TO RIGHT TOTAL HIP ARTHROPLASTY ANTERIOR APPROACH;  Surgeon: Mcarthur Rossetti, MD;  [facility];  Service: Orthopedics;  Laterality: Right;  Marland Kitchen VAGINAL HYSTERECTOMY  1973     There were no vitals filed for this visit.  Subjective Assessment - 07/31/19 1115    Subjective  maybe some better but still having pain that is awful in the mornings.    Pertinent History  PMH: lumbar pain,hypertension, hyperlipidemia, mitral valve prolapse, peripheral arterial disease, TIA, Rt hip THA    How long can you walk comfortably?  cant walk first thing in the morning, has to walk on toes because her heel hurts too bad    Patient Stated Goals  get the pain down    Currently in Pain?  Yes    Pain Score  6     Pain Location  Heel    Pain Orientation  Left    Pain Onset  More than a month ago                       [facility] Adult PT Treatment/Exercise - 07/31/19 0001      Manual Therapy   Manual therapy comments  STM/IASTM to Lt plantarfascia, achillies, gastroc-soleus, manual stretching to heelcord and plantar fascia 60 seconds X 3 reps      Ankle Exercises: Standing   Heel Raises  Both;10 reps   10 second hold at bottom off edge of step     Ankle Exercises: Stretches   Soleus Stretch  60 seconds;1 rep   slantboard   Gastroc Stretch  60 seconds;1 rep  stantboard     Ankle Exercises: Aerobic   Recumbent Bike  L2 x 6 min             PT Education - 07/31/19 1120    Education Details  education to try night splint for plantarfascitits, shoe insert, and for KT tape rationale    Person(s) Educated  Patient    Methods  Explanation;Demonstration;Verbal cues;Handout    Comprehension  Verbalized understanding          PT Long Term Goals - 06/28/19 2124      PT LONG TERM GOAL #1   Title  Pt will be I and compliant with HEP. (target for all LTG 6 weeks 08/09/19)    Status  New      PT LONG TERM GOAL #2   Title  Pt will reduce overall Lt heel pain to less than 2-3/10 with ambulaiton and morning activity.    Status  New      PT LONG TERM GOAL #3   Title  Pt will improve ankle DF to at least 5 deg to show reduced tightness in heelcords to  improve overall gait/function    Status  New            Plan - 07/31/19 1121    Clinical Impression Statement  Session focused on addressing soft tissue restrictions in plantarfascia, and heel cord with stretching and manual therapy. She was encouraged to try heat and stretching more at home due to her tightness. Some session focused on education for night splint, shoe inserts, and more stretching at home. Trialed KT tape to reduce strain on achilles and plantar fascia. PT will asses her response to this next session.    Personal Factors and Comorbidities  Comorbidity 3+;Time since onset of injury/illness/exacerbation    Comorbidities  PMH: lumbar pain,hypertension, hyperlipidemia, mitral valve prolapse, peripheral arterial disease, TIA, Rt hip THA    Examination-Activity Limitations  Locomotion Level    Examination-Participation Restrictions  Community Activity;Shop    Stability/Clinical Decision Making  Stable/Uncomplicated    Rehab Potential  Good    PT Frequency  2x / week    PT Duration  6 weeks    PT Treatment/Interventions  ADLs/Self Care Home Management;Aquatic Therapy;Cryotherapy;Iontophoresis 4mg /ml Dexamethasone;Moist Heat;Ultrasound;Neuromuscular re-education;Balance training;Therapeutic exercise;Therapeutic activities;Stair training;Gait training;Patient/family education;Manual techniques;Dry needling;Passive range of motion;Taping    PT Next Visit Plan  how was KT tape? needs stretching for heel cords and plantar fascia, STM/IASTM, may need additional DN sessions    PT Home Exercise Plan  Access Code: [email or web address]    Consulted and Agree with Plan of Care  Patient       Patient will benefit from skilled therapeutic intervention in order to improve the following deficits and impairments:  Abnormal gait, Decreased activity tolerance, Decreased balance, Decreased mobility, Decreased range of motion, Decreased strength, Difficulty walking, Increased fascial restricitons, Increased  muscle spasms, Postural dysfunction, Pain, Impaired flexibility  Visit Diagnosis: Pain in left ankle and joints of left foot  Stiffness of left ankle, not elsewhere classified  Other abnormalities of gait and mobility     Problem List Patient Active Problem List   Diagnosis Date Noted  . Chest pain 06/22/2019  . First degree AV block 06/22/2019  . Uncontrolled hypertension 06/22/2019  . HLD (hyperlipidemia) 06/22/2019  . PAD (peripheral artery disease) ([place]) 06/22/2019  . Impingement syndrome of right shoulder 12/22/2017  . Sciatica, left side 09/08/2017  . Pain due to right hip joint prosthesis ([place]) 03/06/2016  . Status  post total replacement of right hip 03/06/2016  . Hip fracture requiring operative repair ([place]) 09/22/2015  . Hip injury   . Subcapital fracture of hip ([place]) 09/21/2015  . Hypokalemia 09/21/2015  . HTN (hypertension) 09/21/2015  . GERD (gastroesophageal reflux disease) 09/21/2015  . Lateral meniscal tear 09/16/2011    Silvestre Mesi 07/31/2019, 11:25 AM  [facility] [address] [place], [place], [address] Phone: [phone]   Fax:  [phone]  Name: Lindsey Barnett MRN: [id number] Date of Birth: 07-Sep-1934

## 2019-08-02 ENCOUNTER — Ambulatory Visit: Payer: Medicare Other | Admitting: Physical Therapy

## 2019-08-02 ENCOUNTER — Other Ambulatory Visit: Payer: Self-pay

## 2019-08-02 DIAGNOSIS — M25572 Pain in left ankle and joints of left foot: Secondary | ICD-10-CM

## 2019-08-02 DIAGNOSIS — M25672 Stiffness of left ankle, not elsewhere classified: Secondary | ICD-10-CM

## 2019-08-02 DIAGNOSIS — R2689 Other abnormalities of gait and mobility: Secondary | ICD-10-CM

## 2019-08-02 NOTE — Therapy (Addendum)
[facility] [address] [place], [place], [address] Phone: [phone]   Fax:  [phone]  Physical Therapy Treatment/Discharge Summary  Patient Details  Name: Arryn Terrones MRN: [id number] Date of Birth: 15-Apr-1935 Referring Provider (PT): Mcarthur Rossetti, MD   Encounter Date: 08/02/2019  PT End of Session - 08/02/19 1101    Visit Number  5    Number of Visits  12    Date for PT Re-Evaluation  08/09/19    Authorization Type  UHC MCR, progress note at 10    PT Start Time  0930    PT Stop Time  1020    PT Time Calculation (min)  50 min    Activity Tolerance  Patient tolerated treatment well    Behavior During Therapy  [facility] for tasks assessed/performed       Past Medical History:  Diagnosis Date  . Acute meniscal tear, lateral RIGHT  . Arthritis HIP  . Cataract immature   . GERD (gastroesophageal reflux disease) OCCASIONAL  . History of TIA (transient ischemic attack) 2005--  NO RESIDUAL  . Hyperlipidemia   . Hypertension   . MVP (mitral valve prolapse)   . Peripheral vascular disease ([place])   . Stress incontinence   . Swelling of right knee joint     Past Surgical History:  Procedure Laterality Date  . ANTERIOR APPROACH HEMI HIP ARTHROPLASTY Right 09/22/2015   Procedure: ANTERIOR APPROACH HEMI HIP ARTHROPLASTY;  Surgeon: Dorna Leitz, MD;  Location: [place];  Service: Orthopedics;  Laterality: Right;  . BREAST CYST EXCISION Right   . CHOLECYSTECTOMY  1975  . KNEE ARTHROSCOPY  09/16/2011   Procedure: ARTHROSCOPY KNEE;  Surgeon: Gearlean Alf, MD;  [facility];  Service: Orthopedics;  Laterality: Right;  WITH lateral meniscal debridement  . TOTAL HIP ARTHROPLASTY Right 03/06/2016   Procedure: CONVERSION RIGHT HIP HEMIARTHROPLASTY TO RIGHT TOTAL HIP ARTHROPLASTY ANTERIOR APPROACH;  Surgeon: Mcarthur Rossetti, MD;  [facility];  Service: Orthopedics;  Laterality: Right;  Marland Kitchen VAGINAL  HYSTERECTOMY  1973    There were no vitals filed for this visit.  Subjective Assessment - 08/02/19 1059    Subjective  my pain was so much better, the tape and or the night splint must have really worked.    Pertinent History  PMH: lumbar pain,hypertension, hyperlipidemia, mitral valve prolapse, peripheral arterial disease, TIA, Rt hip THA    How long can you walk comfortably?  cant walk first thing in the morning, has to walk on toes because her heel hurts too bad    Patient Stated Goals  get the pain down    Currently in Pain?  Yes    Pain Score  2     Pain Location  Foot    Pain Orientation  Left    Pain Onset  More than a month ago                       [facility] Adult PT Treatment/Exercise - 08/02/19 0001      Manual Therapy   Manual therapy comments  STM/IASTM to Lt plantarfascia, achillies, gastroc-soleus, manual stretching to heelcord and plantar fascia 60 seconds X 3 reps. KT tape applied to platarfascia up to calf to reduce strain of achilles      Ankle Exercises: Aerobic   Recumbent Bike  L2 x 6 min      Ankle Exercises: Stretches   Soleus Stretch  60 seconds;1 rep;3 reps  slantboard, and runners   Press photographer  60 seconds;3 reps   stantboard, and runners     Ankle Exercises: Standing   Heel Raises  Both;10 reps   10 second hold at bottom off edge of step                 PT Long Term Goals - 06/28/19 2124      PT LONG TERM GOAL #1   Title  Pt will be I and compliant with HEP. (target for all LTG 6 weeks 08/09/19)    Status  New      PT LONG TERM GOAL #2   Title  Pt will reduce overall Lt heel pain to less than 2-3/10 with ambulaiton and morning activity.    Status  New      PT LONG TERM GOAL #3   Title  Pt will improve ankle DF to at least 5 deg to show reduced tightness in heelcords to improve overall gait/function    Status  New            Plan - 08/02/19 1102    Clinical Impression Statement  She had big reduction in  pain since last visit and this may be due to trial of night  splint and KT tape. Continued with MT and stretching today. She will make another visit for 2 weeks to follow up.    Personal Factors and Comorbidities  Comorbidity 3+;Time since onset of injury/illness/exacerbation    Comorbidities  PMH: lumbar pain,hypertension, hyperlipidemia, mitral valve prolapse, peripheral arterial disease, TIA, Rt hip THA    Examination-Activity Limitations  Locomotion Level    Examination-Participation Restrictions  Community Activity;Shop    Stability/Clinical Decision Making  Stable/Uncomplicated    Rehab Potential  Good    PT Frequency  2x / week    PT Duration  6 weeks    PT Treatment/Interventions  ADLs/Self Care Home Management;Aquatic Therapy;Cryotherapy;Iontophoresis 53m/ml Dexamethasone;Moist Heat;Ultrasound;Neuromuscular re-education;Balance training;Therapeutic exercise;Therapeutic activities;Stair training;Gait training;Patient/family education;Manual techniques;Dry needling;Passive range of motion;Taping    PT Next Visit Plan  how was KT tape? needs stretching for heel cords and plantar fascia, STM/IASTM, may need additional DN sessions    PT Home Exercise Plan  Access Code: [email or web address]   Consulted and Agree with Plan of Care  Patient       Patient will benefit from skilled therapeutic intervention in order to improve the following deficits and impairments:  Abnormal gait, Decreased activity tolerance, Decreased balance, Decreased mobility, Decreased range of motion, Decreased strength, Difficulty walking, Increased fascial restricitons, Increased muscle spasms, Postural dysfunction, Pain, Impaired flexibility  Visit Diagnosis: Pain in left ankle and joints of left foot  Stiffness of left ankle, not elsewhere classified  Other abnormalities of gait and mobility     Problem List Patient Active Problem List   Diagnosis Date Noted  . Chest pain 06/22/2019  . First degree AV block 06/22/2019   . Uncontrolled hypertension 06/22/2019  . HLD (hyperlipidemia) 06/22/2019  . PAD (peripheral artery disease) ([place] 06/22/2019  . Impingement syndrome of right shoulder 12/22/2017  . Sciatica, left side 09/08/2017  . Pain due to right hip joint prosthesis ([place] 03/06/2016  . 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 Hip fracture requiring operative repair ([place] 09/22/2015  . Hip injury   . Subcapital fracture of hip ([place] 09/21/2015  . Hypokalemia 09/21/2015  . HTN (hypertension) 09/21/2015  . GERD (gastroesophageal reflux disease) 09/21/2015  . Lateral meniscal tear 09/16/2011  Silvestre Mesi 08/02/2019, 11:04 AM  [facility] [address] [place], [place], [address] Phone: [phone]   Fax:  [phone]  Name: Lannah Koike MRN: [id number] Date of Birth: Jan 07, 1935     PHYSICAL THERAPY DISCHARGE SUMMARY  Visits from Start of Care: 5  Current functional level related to goals / functional outcomes: See above   Remaining deficits: See above   Education / Equipment: HEP  Plan: Patient agrees to discharge.  Patient goals were not met. Patient is being discharged due to not returning since the last visit.  ?????    Laureen Abrahams, PT, DPT 09/28/19 2:32 PM  [facility] [address] [place], [place], [address] Phone: [phone]   Fax:  [phone]

## 2019-08-10 ENCOUNTER — Ambulatory Visit: Payer: Medicare Other | Attending: Internal Medicine

## 2019-08-10 DIAGNOSIS — Z23 Encounter for immunization: Secondary | ICD-10-CM | POA: Insufficient documentation

## 2019-08-10 NOTE — Progress Notes (Signed)
   Covid-19 Vaccination Clinic  Name:  Manjot Peiffer    MRN: [id number] DOB: [birth year]  08/10/2019  Ms. Newmann was observed post Covid-19 immunization for 15 minutes without incidence. She was provided with Vaccine Information Sheet and instruction to access the V-Safe system.   Ms. Steinmann was instructed to call 911 with any severe reactions post vaccine: Marland Kitchen Difficulty breathing  . Swelling of your face and throat  . A fast heartbeat  . A bad rash all over your body  . Dizziness and weakness    Immunizations Administered    Name Date Dose VIS Date Route   Pfizer COVID-19 Vaccine 08/10/2019  8:29 AM 0.3 mL 06/30/2019 Intramuscular   Manufacturer: [employer]   Lot: [id number]   [place]: [id number]

## 2019-08-18 ENCOUNTER — Encounter: Payer: Medicare Other | Admitting: Physical Therapy

## 2019-08-31 ENCOUNTER — Ambulatory Visit: Payer: Medicare Other | Attending: Internal Medicine

## 2019-08-31 DIAGNOSIS — Z23 Encounter for immunization: Secondary | ICD-10-CM | POA: Insufficient documentation

## 2019-08-31 NOTE — Progress Notes (Signed)
   Covid-19 Vaccination Clinic  Name:  Lindsey Barnett    MRN: [id number] DOB: Jun 18, 1935  08/31/2019  Lindsey Barnett was observed post Covid-19 immunization for 15 minutes without incidence. She was provided with Vaccine Information Sheet and instruction to access the V-Safe system.   Lindsey Barnett was instructed to call 911 with any severe reactions post vaccine: Marland Kitchen Difficulty breathing  . Swelling of your face and throat  . A fast heartbeat  . A bad rash all over your body  . Dizziness and weakness    Immunizations Administered    Name Date Dose VIS Date Route   Pfizer COVID-19 Vaccine 08/31/2019  9:26 AM 0.3 mL 06/30/2019 Intramuscular   Manufacturer: [place]   Lot: [id number]   [place]: [id number]

## 2019-09-13 ENCOUNTER — Ambulatory Visit: Payer: Medicare Other

## 2020-05-02 ENCOUNTER — Other Ambulatory Visit: Payer: Self-pay | Admitting: Internal Medicine

## 2020-05-02 DIAGNOSIS — Z1231 Encounter for screening mammogram for malignant neoplasm of breast: Secondary | ICD-10-CM

## 2020-05-04 ENCOUNTER — Ambulatory Visit: Payer: Medicare Other | Attending: Internal Medicine

## 2020-05-04 DIAGNOSIS — Z23 Encounter for immunization: Secondary | ICD-10-CM

## 2020-05-04 NOTE — Progress Notes (Signed)
   Covid-19 Vaccination Clinic  Name:  Lindsey Barnett    MRN: [id number] DOB: 06-Jun-1935  05/04/2020  Lindsey Barnett was observed post Covid-19 immunization for 15 minutes without incident. She was provided with Vaccine Information Sheet and instruction to access the V-Safe system.   Lindsey Barnett was instructed to call 911 with any severe reactions post vaccine: Marland Kitchen Difficulty breathing  . Swelling of face and throat  . A fast heartbeat  . A bad rash all over body  . Dizziness and weakness

## 2020-06-10 ENCOUNTER — Ambulatory Visit: Payer: Medicare Other

## 2020-07-24 ENCOUNTER — Ambulatory Visit
Admission: RE | Admit: 2020-07-24 | Discharge: 2020-07-24 | Disposition: A | Discharge status: Home / Self Care | Payer: Medicare Other | Source: Ambulatory Visit | Attending: Internal Medicine | Admitting: Internal Medicine

## 2020-07-24 ENCOUNTER — Other Ambulatory Visit: Payer: Self-pay

## 2020-07-24 DIAGNOSIS — Z1231 Encounter for screening mammogram for malignant neoplasm of breast: Secondary | ICD-10-CM

## 2020-07-27 DIAGNOSIS — M158 Other polyosteoarthritis: Secondary | ICD-10-CM | POA: Diagnosis not present

## 2020-07-27 DIAGNOSIS — K219 Gastro-esophageal reflux disease without esophagitis: Secondary | ICD-10-CM | POA: Diagnosis not present

## 2020-07-27 DIAGNOSIS — M179 Osteoarthritis of knee, unspecified: Secondary | ICD-10-CM | POA: Diagnosis not present

## 2020-07-27 DIAGNOSIS — I10 Essential (primary) hypertension: Secondary | ICD-10-CM | POA: Diagnosis not present

## 2020-07-27 DIAGNOSIS — E782 Mixed hyperlipidemia: Secondary | ICD-10-CM | POA: Diagnosis not present

## 2020-09-05 DIAGNOSIS — I10 Essential (primary) hypertension: Secondary | ICD-10-CM | POA: Diagnosis not present

## 2020-09-05 DIAGNOSIS — M179 Osteoarthritis of knee, unspecified: Secondary | ICD-10-CM | POA: Diagnosis not present

## 2020-09-05 DIAGNOSIS — E782 Mixed hyperlipidemia: Secondary | ICD-10-CM | POA: Diagnosis not present

## 2020-09-05 DIAGNOSIS — M158 Other polyosteoarthritis: Secondary | ICD-10-CM | POA: Diagnosis not present

## 2020-09-05 DIAGNOSIS — K219 Gastro-esophageal reflux disease without esophagitis: Secondary | ICD-10-CM | POA: Diagnosis not present

## 2020-09-10 DIAGNOSIS — H43393 Other vitreous opacities, bilateral: Secondary | ICD-10-CM | POA: Diagnosis not present

## 2020-09-10 DIAGNOSIS — H524 Presbyopia: Secondary | ICD-10-CM | POA: Diagnosis not present

## 2020-09-10 DIAGNOSIS — H35032 Hypertensive retinopathy, left eye: Secondary | ICD-10-CM | POA: Diagnosis not present

## 2020-09-10 DIAGNOSIS — H35031 Hypertensive retinopathy, right eye: Secondary | ICD-10-CM | POA: Diagnosis not present

## 2020-09-10 DIAGNOSIS — H43813 Vitreous degeneration, bilateral: Secondary | ICD-10-CM | POA: Diagnosis not present

## 2020-09-10 DIAGNOSIS — H52223 Regular astigmatism, bilateral: Secondary | ICD-10-CM | POA: Diagnosis not present

## 2020-09-10 DIAGNOSIS — H04129 Dry eye syndrome of unspecified lacrimal gland: Secondary | ICD-10-CM | POA: Diagnosis not present

## 2020-09-10 DIAGNOSIS — H5213 Myopia, bilateral: Secondary | ICD-10-CM | POA: Diagnosis not present

## 2020-09-10 DIAGNOSIS — Z961 Presence of intraocular lens: Secondary | ICD-10-CM | POA: Diagnosis not present

## 2020-09-11 DIAGNOSIS — I10 Essential (primary) hypertension: Secondary | ICD-10-CM | POA: Diagnosis not present

## 2020-09-11 DIAGNOSIS — K529 Noninfective gastroenteritis and colitis, unspecified: Secondary | ICD-10-CM | POA: Diagnosis not present

## 2020-10-10 DIAGNOSIS — N390 Urinary tract infection, site not specified: Secondary | ICD-10-CM | POA: Diagnosis not present

## 2020-10-10 DIAGNOSIS — I10 Essential (primary) hypertension: Secondary | ICD-10-CM | POA: Diagnosis not present

## 2020-10-10 DIAGNOSIS — M158 Other polyosteoarthritis: Secondary | ICD-10-CM | POA: Diagnosis not present

## 2020-10-24 DIAGNOSIS — L309 Dermatitis, unspecified: Secondary | ICD-10-CM | POA: Diagnosis not present

## 2020-11-13 DIAGNOSIS — K219 Gastro-esophageal reflux disease without esophagitis: Secondary | ICD-10-CM | POA: Diagnosis not present

## 2020-11-13 DIAGNOSIS — M179 Osteoarthritis of knee, unspecified: Secondary | ICD-10-CM | POA: Diagnosis not present

## 2020-11-13 DIAGNOSIS — M158 Other polyosteoarthritis: Secondary | ICD-10-CM | POA: Diagnosis not present

## 2020-11-13 DIAGNOSIS — I10 Essential (primary) hypertension: Secondary | ICD-10-CM | POA: Diagnosis not present

## 2020-11-13 DIAGNOSIS — E782 Mixed hyperlipidemia: Secondary | ICD-10-CM | POA: Diagnosis not present

## 2020-12-24 DIAGNOSIS — L570 Actinic keratosis: Secondary | ICD-10-CM | POA: Diagnosis not present

## 2020-12-24 DIAGNOSIS — L820 Inflamed seborrheic keratosis: Secondary | ICD-10-CM | POA: Diagnosis not present

## 2020-12-24 DIAGNOSIS — D485 Neoplasm of uncertain behavior of skin: Secondary | ICD-10-CM | POA: Diagnosis not present

## 2020-12-24 DIAGNOSIS — R58 Hemorrhage, not elsewhere classified: Secondary | ICD-10-CM | POA: Diagnosis not present

## 2020-12-24 DIAGNOSIS — L578 Other skin changes due to chronic exposure to nonionizing radiation: Secondary | ICD-10-CM | POA: Diagnosis not present

## 2021-01-06 DIAGNOSIS — E782 Mixed hyperlipidemia: Secondary | ICD-10-CM | POA: Diagnosis not present

## 2021-01-06 DIAGNOSIS — K219 Gastro-esophageal reflux disease without esophagitis: Secondary | ICD-10-CM | POA: Diagnosis not present

## 2021-01-06 DIAGNOSIS — M158 Other polyosteoarthritis: Secondary | ICD-10-CM | POA: Diagnosis not present

## 2021-01-06 DIAGNOSIS — I10 Essential (primary) hypertension: Secondary | ICD-10-CM | POA: Diagnosis not present

## 2021-01-06 DIAGNOSIS — E785 Hyperlipidemia, unspecified: Secondary | ICD-10-CM | POA: Diagnosis not present

## 2021-01-06 DIAGNOSIS — M179 Osteoarthritis of knee, unspecified: Secondary | ICD-10-CM | POA: Diagnosis not present

## 2021-02-03 DIAGNOSIS — M158 Other polyosteoarthritis: Secondary | ICD-10-CM | POA: Diagnosis not present

## 2021-02-03 DIAGNOSIS — I10 Essential (primary) hypertension: Secondary | ICD-10-CM | POA: Diagnosis not present

## 2021-02-03 DIAGNOSIS — E785 Hyperlipidemia, unspecified: Secondary | ICD-10-CM | POA: Diagnosis not present

## 2021-02-03 DIAGNOSIS — E782 Mixed hyperlipidemia: Secondary | ICD-10-CM | POA: Diagnosis not present

## 2021-02-03 DIAGNOSIS — K219 Gastro-esophageal reflux disease without esophagitis: Secondary | ICD-10-CM | POA: Diagnosis not present

## 2021-02-03 DIAGNOSIS — M179 Osteoarthritis of knee, unspecified: Secondary | ICD-10-CM | POA: Diagnosis not present

## 2021-02-17 DIAGNOSIS — I10 Essential (primary) hypertension: Secondary | ICD-10-CM | POA: Diagnosis not present

## 2021-02-17 DIAGNOSIS — K219 Gastro-esophageal reflux disease without esophagitis: Secondary | ICD-10-CM | POA: Diagnosis not present

## 2021-02-17 DIAGNOSIS — M179 Osteoarthritis of knee, unspecified: Secondary | ICD-10-CM | POA: Diagnosis not present

## 2021-02-17 DIAGNOSIS — M158 Other polyosteoarthritis: Secondary | ICD-10-CM | POA: Diagnosis not present

## 2021-02-17 DIAGNOSIS — E782 Mixed hyperlipidemia: Secondary | ICD-10-CM | POA: Diagnosis not present

## 2021-02-17 DIAGNOSIS — E785 Hyperlipidemia, unspecified: Secondary | ICD-10-CM | POA: Diagnosis not present

## 2021-04-18 DIAGNOSIS — K219 Gastro-esophageal reflux disease without esophagitis: Secondary | ICD-10-CM | POA: Diagnosis not present

## 2021-04-18 DIAGNOSIS — M179 Osteoarthritis of knee, unspecified: Secondary | ICD-10-CM | POA: Diagnosis not present

## 2021-04-18 DIAGNOSIS — E785 Hyperlipidemia, unspecified: Secondary | ICD-10-CM | POA: Diagnosis not present

## 2021-04-18 DIAGNOSIS — M158 Other polyosteoarthritis: Secondary | ICD-10-CM | POA: Diagnosis not present

## 2021-04-18 DIAGNOSIS — I10 Essential (primary) hypertension: Secondary | ICD-10-CM | POA: Diagnosis not present

## 2021-04-18 DIAGNOSIS — E782 Mixed hyperlipidemia: Secondary | ICD-10-CM | POA: Diagnosis not present

## 2021-05-06 ENCOUNTER — Other Ambulatory Visit: Payer: Self-pay | Admitting: Internal Medicine

## 2021-05-06 ENCOUNTER — Ambulatory Visit
Admission: RE | Admit: 2021-05-06 | Discharge: 2021-05-06 | Disposition: A | Discharge status: Home / Self Care | Payer: Medicare Other | Source: Ambulatory Visit | Attending: Internal Medicine | Admitting: Internal Medicine

## 2021-05-06 DIAGNOSIS — Z0000 Encounter for general adult medical examination without abnormal findings: Secondary | ICD-10-CM | POA: Diagnosis not present

## 2021-05-06 DIAGNOSIS — R0609 Other forms of dyspnea: Secondary | ICD-10-CM | POA: Diagnosis not present

## 2021-05-06 DIAGNOSIS — E559 Vitamin D deficiency, unspecified: Secondary | ICD-10-CM | POA: Diagnosis not present

## 2021-05-06 DIAGNOSIS — R079 Chest pain, unspecified: Secondary | ICD-10-CM | POA: Diagnosis not present

## 2021-05-06 DIAGNOSIS — I672 Cerebral atherosclerosis: Secondary | ICD-10-CM | POA: Diagnosis not present

## 2021-05-06 DIAGNOSIS — R600 Localized edema: Secondary | ICD-10-CM | POA: Diagnosis not present

## 2021-05-06 DIAGNOSIS — R0600 Dyspnea, unspecified: Secondary | ICD-10-CM | POA: Diagnosis not present

## 2021-05-06 DIAGNOSIS — M158 Other polyosteoarthritis: Secondary | ICD-10-CM | POA: Diagnosis not present

## 2021-05-06 DIAGNOSIS — R209 Unspecified disturbances of skin sensation: Secondary | ICD-10-CM | POA: Diagnosis not present

## 2021-05-06 DIAGNOSIS — E782 Mixed hyperlipidemia: Secondary | ICD-10-CM | POA: Diagnosis not present

## 2021-05-06 DIAGNOSIS — Z1389 Encounter for screening for other disorder: Secondary | ICD-10-CM | POA: Diagnosis not present

## 2021-05-06 DIAGNOSIS — Z23 Encounter for immunization: Secondary | ICD-10-CM | POA: Diagnosis not present

## 2021-05-06 DIAGNOSIS — I10 Essential (primary) hypertension: Secondary | ICD-10-CM | POA: Diagnosis not present

## 2021-05-06 DIAGNOSIS — I872 Venous insufficiency (chronic) (peripheral): Secondary | ICD-10-CM | POA: Diagnosis not present

## 2021-05-06 DIAGNOSIS — E538 Deficiency of other specified B group vitamins: Secondary | ICD-10-CM | POA: Diagnosis not present

## 2021-05-12 ENCOUNTER — Ambulatory Visit (INDEPENDENT_AMBULATORY_CARE_PROVIDER_SITE_OTHER): Payer: Medicare Other

## 2021-05-12 ENCOUNTER — Other Ambulatory Visit: Payer: Self-pay

## 2021-05-12 ENCOUNTER — Encounter: Payer: Self-pay | Admitting: Cardiology

## 2021-05-12 ENCOUNTER — Ambulatory Visit: Payer: Medicare Other | Admitting: Cardiology

## 2021-05-12 VITALS — BP 146/82 | HR 67 | Ht 68.0 in | Wt 158.0 lb

## 2021-05-12 DIAGNOSIS — R002 Palpitations: Secondary | ICD-10-CM

## 2021-05-12 DIAGNOSIS — I10 Essential (primary) hypertension: Secondary | ICD-10-CM

## 2021-05-12 DIAGNOSIS — R5383 Other fatigue: Secondary | ICD-10-CM | POA: Diagnosis not present

## 2021-05-12 DIAGNOSIS — R0602 Shortness of breath: Secondary | ICD-10-CM

## 2021-05-12 DIAGNOSIS — E785 Hyperlipidemia, unspecified: Secondary | ICD-10-CM | POA: Diagnosis not present

## 2021-05-12 DIAGNOSIS — R0789 Other chest pain: Secondary | ICD-10-CM | POA: Diagnosis not present

## 2021-05-12 NOTE — Progress Notes (Signed)
Cardiology Office Note:    Date:  05/12/2021   ID:  Lindsey, Barnett [birth year], MRN [id number]  PCP:  Josetta Huddle, MD  Cardiologist:  Berniece Salines, DO  Electrophysiologist:  None   Referring MD: Josetta Huddle, MD   " I am feeling significant shortness of breath and fatigue now responded to my activities"  History of Present Illness:    Lindsey Barnett is a 86 y.o. female with a hx of hypertension, hyperlipidemia, GERD, history of CVA and TIA is now on aspirin and Plavix here today to be evaluated for worsening fatigue and shortness of breath on exertion.  The patient tells me that she is begun gardening and has been doing well but recently she noticed that with limited exertion she gets significantly short of breath.  She tells that she also gets fatigue and this is new.  She notes that before she experiences sensations when she is working she gets significant palpitations and then after that she experienced the shortness of breath and the fatigue.  She symptoms have had some positional lightheadedness. In addition she reports that she is experiencing some chest discomfort mostly right side nonradiating but is a pressure-like feeling.  No other complaints at this time.  Past Medical History:  Diagnosis Date   Acute meniscal tear, lateral RIGHT   Arthritis HIP   Cataract immature    GERD (gastroesophageal reflux disease) OCCASIONAL   History of TIA (transient ischemic attack) 2005--  NO RESIDUAL   Hyperlipidemia    Hypertension    MVP (mitral valve prolapse)    Peripheral vascular disease (HCC)    Stress incontinence    Swelling of right knee joint     Past Surgical History:  Procedure Laterality Date   ANTERIOR APPROACH HEMI HIP ARTHROPLASTY Right 09/22/2015   Procedure: ANTERIOR APPROACH HEMI HIP ARTHROPLASTY;  Surgeon: Dorna Leitz, MD;  Location: [facility];  Service: Orthopedics;  Laterality: Right;   BREAST CYST EXCISION Right    CHOLECYSTECTOMY  1975   KNEE  ARTHROSCOPY  09/16/2011   Procedure: ARTHROSCOPY KNEE;  Surgeon: Gearlean Alf, MD;  Location: [facility];  Service: Orthopedics;  Laterality: Right;  WITH lateral meniscal debridement   TOTAL HIP ARTHROPLASTY Right 03/06/2016   Procedure: CONVERSION RIGHT HIP HEMIARTHROPLASTY TO RIGHT TOTAL HIP ARTHROPLASTY ANTERIOR APPROACH;  Surgeon: Mcarthur Rossetti, MD;  Location: [facility];  Service: Orthopedics;  Laterality: Right;   VAGINAL HYSTERECTOMY  1973    Current Medications: Current Meds  Medication Sig   acetaminophen (TYLENOL) 500 MG tablet Take 1,000 mg by mouth every 6 (six) hours as needed for mild pain.   amLODipine (NORVASC) 5 MG tablet Take 5 mg by mouth daily.   amoxicillin (AMOXIL) 500 MG capsule Take 2,000 mg by mouth daily as needed (1 hour prior to dental appointments).   aspirin 81 MG tablet Take 81 mg by mouth once a week.    cefUROXime (CEFTIN) 250 MG tablet SMARTSIG:1 Tablet(s) By Mouth Every 12 Hours   Cholecalciferol 2000 units CAPS Take 2,000 Units by mouth daily.   clopidogrel (PLAVIX) 75 MG tablet Take 75 mg by mouth daily.   Coenzyme Q10 (COQ10) 200 MG CAPS Take 200 mg by mouth daily.    estradiol (ESTRACE) 0.5 MG tablet Take 0.5 mg by mouth daily. Take for 2 weeks and off for 1 week   losartan (COZAAR) 100 MG tablet Take 100 mg by mouth daily.   Multiple Vitamin (MULTIVITAMIN) tablet Take 1 tablet  by mouth at bedtime.    pantoprazole (PROTONIX) 40 MG tablet Take 1 tablet (40 mg total) by mouth daily.   polyethylene glycol (MIRALAX / GLYCOLAX) packet Take 17 g by mouth daily. (Patient taking differently: Take 17 g by mouth daily as needed for mild constipation.)   rosuvastatin (CRESTOR) 10 MG tablet Take 10 mg by mouth 3 (three) times a week. MWF     Allergies:   Codeine, Contrast media [iodinated diagnostic agents], Iodine, and Sodium tetradecyl sulfate   Social History   Socioeconomic History   Marital status: Married    Spouse name: Not on  file   Number of children: Not on file   Years of education: Not on file   Highest education level: Not on file  Occupational History   Not on file  Tobacco Use   Smoking status: Never   Smokeless tobacco: Never  Substance and Sexual Activity   Alcohol use: Yes    Comment: OCCASIONAL   Drug use: No   Sexual activity: Not on file  Other Topics Concern   Not on file  Social History Narrative   Not on file   Social Determinants of Health   Financial Resource Strain: Not on file  Food Insecurity: Not on file  Transportation Needs: Not on file  Physical Activity: Not on file  Stress: Not on file  Social Connections: Not on file     Family History: The patient's family history is negative for Breast cancer.  ROS:   Review of Systems  Constitution: Negative for decreased appetite, fever and weight gain.  HENT: Negative for congestion, ear discharge, hoarse voice and sore throat.   Eyes: Negative for discharge, redness, vision loss in right eye and visual halos.  Cardiovascular: Negative for chest pain, dyspnea on exertion, leg swelling, orthopnea and palpitations.  Respiratory: Negative for cough, hemoptysis, shortness of breath and snoring.   Endocrine: Negative for heat intolerance and polyphagia.  Hematologic/Lymphatic: Negative for bleeding problem. Does not bruise/bleed easily.  Skin: Negative for flushing, nail changes, rash and suspicious lesions.  Musculoskeletal: Negative for arthritis, joint pain, muscle cramps, myalgias, neck pain and stiffness.  Gastrointestinal: Negative for abdominal pain, bowel incontinence, diarrhea and excessive appetite.  Genitourinary: Negative for decreased libido, genital sores and incomplete emptying.  Neurological: Negative for brief paralysis, focal weakness, headaches and loss of balance.  Psychiatric/Behavioral: Negative for altered mental status, depression and suicidal ideas.  Allergic/Immunologic: Negative for HIV exposure and  persistent infections.    EKGs/Labs/Other Studies Reviewed:    The following studies were reviewed today:   EKG:  The ekg ordered today demonstrates sinus rhythm, heart rate 67 bpm with first-degree AV block.  Compared to EKG done at her PCP office no significant change.  Echo 2020 IMPRESSIONS     1. Left ventricular ejection fraction, by visual estimation, is 60 to  65%. The left ventricle has normal function. There is no left ventricular  hypertrophy.   2. Global right ventricle has normal systolic function.The right  ventricular size is normal. No increase in right ventricular wall  thickness.   3. Left atrial size was normal.   4. Right atrial size was normal.   5. The mitral valve is normal in structure. No evidence of mitral valve  regurgitation. No evidence of mitral stenosis.   6. The tricuspid valve is normal in structure. Tricuspid valve  regurgitation is not demonstrated.   7. The aortic valve is normal in structure. Aortic valve regurgitation is  not  visualized. No evidence of aortic valve sclerosis or stenosis.   8. The pulmonic valve was normal in structure. Pulmonic valve  regurgitation is trivial.   9. Normal pulmonary artery systolic pressure.  10. The atrial septum is grossly normal.   FINDINGS   Left Ventricle: Left ventricular ejection fraction, by visual estimation,  is 60 to 65%. The left ventricle has normal function. There is no left  ventricular hypertrophy.   Right Ventricle: The right ventricular size is normal. No increase in  right ventricular wall thickness. Global RV systolic function is has  normal systolic function. The tricuspid regurgitant velocity is 2.58 m/s,  and with an assumed right atrial pressure   of 3 mmHg, the estimated right ventricular systolic pressure is normal at  29.6 mmHg.   Left Atrium: Left atrial size was normal in size.   Right Atrium: Right atrial size was normal in size   Pericardium: There is no evidence of  pericardial effusion.   Mitral Valve: The mitral valve is normal in structure. No evidence of  mitral valve stenosis by observation. No evidence of mitral valve  regurgitation.   Tricuspid Valve: The tricuspid valve is normal in structure. Tricuspid  valve regurgitation is not demonstrated.   Aortic Valve: The aortic valve is normal in structure. Aortic valve  regurgitation is not visualized. The aortic valve is structurally normal,  with no evidence of sclerosis or stenosis.   Pulmonic Valve: The pulmonic valve was normal in structure. Pulmonic valve  regurgitation is trivial.   Aorta: The aortic root and ascending aorta are structurally normal, with  no evidence of dilitation.   IAS/Shunts: The atrial septum is grossly normal.   Recent Labs: No results found for requested labs within last 8760 hours.  Recent Lipid Panel    Component Value Date/Time   CHOL 213 (H) 06/23/2019 0426   TRIG 88 06/23/2019 0426   HDL 67 06/23/2019 0426   CHOLHDL 3.2 06/23/2019 0426   VLDL 18 06/23/2019 0426   LDLCALC 128 (H) 06/23/2019 0426    Physical Exam:    VS:  BP (!) 146/82   Pulse 67   Ht 5\' 8"  (1.727 m)   Wt 158 lb (71.7 kg)   SpO2 98%   BMI 24.02 kg/m     Wt Readings from Last 3 Encounters:  05/12/21 158 lb (71.7 kg)  06/22/19 158 lb 8 oz (71.9 kg)  12/22/17 165 lb (74.8 kg)     GEN: Well nourished, well developed in no acute distress HEENT: Normal NECK: No JVD; No carotid bruits LYMPHATICS: No lymphadenopathy CARDIAC: S1S2 noted,RRR, no murmurs, rubs, gallops RESPIRATORY:  Clear to auscultation without rales, wheezing or rhonchi  ABDOMEN: Soft, non-tender, non-distended, +bowel sounds, no guarding. EXTREMITIES: No edema, No cyanosis, no clubbing MUSCULOSKELETAL:  No deformity  SKIN: Warm and dry NEUROLOGIC:  Alert and oriented x 3, non-focal PSYCHIATRIC:  Normal affect, good insight  ASSESSMENT:    1. SOB (shortness of breath)   2. Other chest pain   3. Fatigue,  unspecified type   4. Palpitations   5. Hypertension, unspecified type   6. Hyperlipidemia, unspecified hyperlipidemia type    PLAN:    I would like to rule out a cardiovascular etiology of this palpitation, therefore at this time I would like to placed a zio patch for 14  days.  In addition with her chest discomfort and her risk factors I would like for the patient undergo an ischemic evaluation.  Carlton Adam will be ordered  at this time. Blood pressure is acceptable, continue with current antihypertensive regimen. Insert hyperlipidemia  Once these testing have been performed amd reviewed further reccomendations will be made. For now, I do reccomend that the patient goes to the nearest ED if  symptoms recur.   The patient is in agreement with the above plan. The patient left the office in stable condition.  The patient will follow up in   Medication Adjustments/Labs and Tests Ordered: Current medicines are reviewed at length with the patient today.  Concerns regarding medicines are outlined above.  Orders Placed This Encounter  Procedures   MYOCARDIAL PERFUSION IMAGING   LONG TERM MONITOR (3-14 DAYS)   EKG 12-Lead   No orders of the defined types were placed in this encounter.   Patient Instructions  Medication Instructions:  Your physician recommends that you continue on your current medications as directed. Please refer to the Current Medication list given to you today.  *If you need a refill on your cardiac medications before your next appointment, please call your pharmacy*   Lab Work: None If you have labs (blood work) drawn today and your tests are completely normal, you will receive your results only by: [facility] (if you have MyChart) OR A paper copy in the mail If you have any lab test that is abnormal or we need to change your treatment, we will call you to review the results.   Testing/Procedures: Your physician has requested that you have a lexiscan myoview.  For further information please visit [email or web address]. Please follow instruction sheet, as given.  The test will take approximately 3 to 4 hours to complete; you may bring reading material.  If someone comes with you to your appointment, they will need to remain in the main lobby due to limited space in the testing area. **If you are pregnant or breastfeeding, please notify the nuclear lab prior to your appointment**  How to prepare for your Myocardial Perfusion Test: Do not eat or drink 3 hours prior to your test, except you may have water. Do not consume products containing caffeine (regular or decaffeinated) 12 hours prior to your test. (ex: coffee, chocolate, sodas, tea). Do bring a list of your current medications with you.  If not listed below, you may take your medications as normal. Do wear comfortable clothes (no dresses or overalls) and walking shoes, tennis shoes preferred (No heels or open toe shoes are allowed). Do NOT wear cologne, perfume, aftershave, or lotions (deodorant is allowed). If these instructions are not followed, your test will have to be rescheduled.  ZIO XT- Long Term Monitor Instructions  Dr. Harriet Masson has requested you wear a ZIO patch monitor for 14 days.  This is a single patch monitor. [employer] supplies one patch monitor per enrollment. Additional stickers are not available. Please do not apply patch if you will be having a Nuclear Stress Test,  Echocardiogram, Cardiac CT, MRI, or Chest Xray during the period you would be wearing the  monitor. The patch cannot be worn during these tests. You cannot remove and re-apply the  ZIO XT patch monitor.  Your ZIO patch monitor will be mailed 3 day USPS to your address on file. It may take 3-5 days  to receive your monitor after you have been enrolled.  Once you have received your monitor, please review the enclosed instructions. Your monitor  has already been registered assigning a specific monitor serial # to  you.  Billing and Patient Assistance Program Information  We have  supplied [employer] with any of your insurance information on file for billing purposes. [employer] offers a sliding scale Patient Assistance Program for patients that do not have  insurance, or whose insurance does not completely cover the cost of the ZIO monitor.  You must apply for the Patient Assistance Program to qualify for this discounted rate.  To apply, please call [employer] at [phone], select option 4, select option 2, ask to apply for  Patient Assistance Program. Theodore Demark will ask your household income, and how many people  are in your household. They will quote your out-of-pocket cost based on that information.  [employer] will also be able to set up a 3-month, interest-free payment plan if needed.  Applying the monitor   Shave hair from upper left chest.  Hold abrader disc by orange tab. Rub abrader in 40 strokes over the upper left chest as  indicated in your monitor instructions.  Clean area with 4 enclosed alcohol pads. Let dry.  Apply patch as indicated in monitor instructions. Patch will be placed under collarbone on left  side of chest with arrow pointing upward.  Rub patch adhesive wings for 2 minutes. Remove white label marked "1". Remove the white  label marked "2". Rub patch adhesive wings for 2 additional minutes.  While looking in a mirror, press and release button in center of patch. A small green light will  flash 3-4 times. This will be your only indicator that the monitor has been turned on.  Do not shower for the first 24 hours. You may shower after the first 24 hours.  Press the button if you feel a symptom. You will hear a small click. Record Date, Time and  Symptom in the Patient Logbook.  When you are ready to remove the patch, follow instructions on the last 2 pages of Patient  Logbook. Stick patch monitor onto the last page of Patient Logbook.  Place Patient Logbook in the blue and white box.  Use locking tab on box and tape box closed  securely. The blue and white box has prepaid postage on it. Please place it in the mailbox as  soon as possible. Your physician should have your test results approximately 7 days after the  monitor has been mailed back to [facility].  Call Sharon at [phone] if you have questions regarding  your ZIO XT patch monitor. Call them immediately if you see an orange light blinking on your  monitor.  If your monitor falls off in less than 4 days, contact our Monitor department at [phone].  If your monitor becomes loose or falls off after 4 days call [employer] at [phone] for  suggestions on securing your monitor    Follow-Up: At [facility], you and your health needs are our priority.  As part of our continuing mission to provide you with exceptional heart care, we have created designated Provider Care Teams.  These Care Teams include your primary Cardiologist (physician) and Advanced Practice Providers (APPs -  Physician Assistants and Nurse Practitioners) who all work together to provide you with the care you need, when you need it.  We recommend signing up for the patient portal called "MyChart".  Sign up information is provided on this After Visit Summary.  MyChart is used to connect with patients for Virtual Visits (Telemedicine).  Patients are able to view lab/test results, encounter notes, upcoming appointments, etc.  Non-urgent messages can be sent to your provider as well.   To learn more about what  you can do with MyChart, go to [email or web address].    Your next appointment:   4 month(s)  The format for your next appointment:   In Person  Provider:   Berniece Salines, DO [address], [place], [place] [address]     Other Instructions    Adopting a Healthy Lifestyle.  Know what a healthy weight is for you (roughly BMI <25) and aim to maintain this   Aim for 7+ servings of fruits and  vegetables daily   65-80+ fluid ounces of water or unsweet tea for healthy kidneys   Limit to max 1 drink of alcohol per day; avoid smoking/tobacco   Limit animal fats in diet for cholesterol and heart health - choose grass fed whenever available   Avoid highly processed foods, and foods high in saturated/trans fats   Aim for low stress - take time to unwind and care for your mental health   Aim for 150 min of moderate intensity exercise weekly for heart health, and weights twice weekly for bone health   Aim for 7-9 hours of sleep daily   When it comes to diets, agreement about the perfect plan isnt easy to find, even among the experts. Experts at the [employer] developed an idea known as the Healthy Eating Plate. Just imagine a plate divided into logical, healthy portions.   The emphasis is on diet quality:   Load up on vegetables and fruits - one-half of your plate: Aim for color and variety, and remember that potatoes dont count.   Go for whole grains - one-quarter of your plate: Whole wheat, barley, wheat berries, quinoa, oats, brown rice, and foods made with them. If you want pasta, go with whole wheat pasta.   Protein power - one-quarter of your plate: Fish, chicken, beans, and nuts are all healthy, versatile protein sources. Limit red meat.   The diet, however, does go beyond the plate, offering a few other suggestions.   Use healthy plant oils, such as olive, canola, soy, corn, sunflower and peanut. Check the labels, and avoid partially hydrogenated oil, which have unhealthy trans fats.   If youre thirsty, drink water. Coffee and tea are good in moderation, but skip sugary drinks and limit milk and dairy products to one or two daily servings.   The type of carbohydrate in the diet is more important than the amount. Some sources of carbohydrates, such as vegetables, fruits, whole grains, and beans-are healthier than others.   Finally, stay  active  Signed, Berniece Salines, DO  05/12/2021 11:59 AM    [place]

## 2021-05-12 NOTE — Patient Instructions (Addendum)
Medication Instructions:  Your physician recommends that you continue on your current medications as directed. Please refer to the Current Medication list given to you today.  *If you need a refill on your cardiac medications before your next appointment, please call your pharmacy*   Lab Work: None If you have labs (blood work) drawn today and your tests are completely normal, you will receive your results only by: [place] (if you have MyChart) OR A paper copy in the mail If you have any lab test that is abnormal or we need to change your treatment, we will call you to review the results.   Testing/Procedures: Your physician has requested that you have a lexiscan myoview. For further information please visit [email or web address]. Please follow instruction sheet, as given.  The test will take approximately 3 to 4 hours to complete; you may bring reading material.  If someone comes with you to your appointment, they will need to remain in the main lobby due to limited space in the testing area. **If you are pregnant or breastfeeding, please notify the nuclear lab prior to your appointment**  How to prepare for your Myocardial Perfusion Test: Do not eat or drink 3 hours prior to your test, except you may have water. Do not consume products containing caffeine (regular or decaffeinated) 12 hours prior to your test. (ex: coffee, chocolate, sodas, tea). Do bring a list of your current medications with you.  If not listed below, you may take your medications as normal. Do wear comfortable clothes (no dresses or overalls) and walking shoes, tennis shoes preferred (No heels or open toe shoes are allowed). Do NOT wear cologne, perfume, aftershave, or lotions (deodorant is allowed). If these instructions are not followed, your test will have to be rescheduled.  ZIO XT- Long Term Monitor Instructions  Dr. Harriet Masson has requested you wear a ZIO patch monitor for 14 days.  This is a single patch  monitor. [employer] supplies one patch monitor per enrollment. Additional stickers are not available. Please do not apply patch if you will be having a Nuclear Stress Test,  Echocardiogram, Cardiac CT, MRI, or Chest Xray during the period you would be wearing the  monitor. The patch cannot be worn during these tests. You cannot remove and re-apply the  ZIO XT patch monitor.  Your ZIO patch monitor will be mailed 3 day USPS to your address on file. It may take 3-5 days  to receive your monitor after you have been enrolled.  Once you have received your monitor, please review the enclosed instructions. Your monitor  has already been registered assigning a specific monitor serial # to you.  Billing and Patient Assistance Program Information  We have supplied [employer] with any of your insurance information on file for billing purposes. [employer] offers a sliding scale Patient Assistance Program for patients that do not have  insurance, or whose insurance does not completely cover the cost of the ZIO monitor.  You must apply for the Patient Assistance Program to qualify for this discounted rate.  To apply, please call [employer] at [phone], select option 4, select option 2, ask to apply for  Patient Assistance Program. Theodore Demark will ask your household income, and how many people  are in your household. They will quote your out-of-pocket cost based on that information.  [employer] will also be able to set up a 36-month, interest-free payment plan if needed.  Applying the monitor   Shave hair from upper left chest.  Hold abrader disc by  orange tab. Rub abrader in 40 strokes over the upper left chest as  indicated in your monitor instructions.  Clean area with 4 enclosed alcohol pads. Let dry.  Apply patch as indicated in monitor instructions. Patch will be placed under collarbone on left  side of chest with arrow pointing upward.  Rub patch adhesive wings for 2 minutes. Remove white label marked "1".  Remove the white  label marked "2". Rub patch adhesive wings for 2 additional minutes.  While looking in a mirror, press and release button in center of patch. A small green light will  flash 3-4 times. This will be your only indicator that the monitor has been turned on.  Do not shower for the first 24 hours. You may shower after the first 24 hours.  Press the button if you feel a symptom. You will hear a small click. Record Date, Time and  Symptom in the Patient Logbook.  When you are ready to remove the patch, follow instructions on the last 2 pages of Patient  Logbook. Stick patch monitor onto the last page of Patient Logbook.  Place Patient Logbook in the blue and white box. Use locking tab on box and tape box closed  securely. The blue and white box has prepaid postage on it. Please place it in the mailbox as  soon as possible. Your physician should have your test results approximately 7 days after the  monitor has been mailed back to [facility].  Call [place] at [phone] if you have questions regarding  your ZIO XT patch monitor. Call them immediately if you see an orange light blinking on your  monitor.  If your monitor falls off in less than 4 days, contact our Monitor department at [phone].  If your monitor becomes loose or falls off after 4 days call [employer] at [phone] for  suggestions on securing your monitor    Follow-Up: At [facility], you and your health needs are our priority.  As part of our continuing mission to provide you with exceptional heart care, we have created designated Provider Care Teams.  These Care Teams include your primary Cardiologist (physician) and Advanced Practice Providers (APPs -  Physician Assistants and Nurse Practitioners) who all work together to provide you with the care you need, when you need it.  We recommend signing up for the patient portal called "MyChart".  Sign up information is provided on  this After Visit Summary.  MyChart is used to connect with patients for Virtual Visits (Telemedicine).  Patients are able to view lab/test results, encounter notes, upcoming appointments, etc.  Non-urgent messages can be sent to your provider as well.   To learn more about what you can do with MyChart, go to [email or web address].    Your next appointment:   4 month(s)  The format for your next appointment:   In Person  Provider:   Berniece Salines, DO [address], [place], [place] [address]     Other Instructions

## 2021-05-12 NOTE — Progress Notes (Unsigned)
Patient enrolled for [employer] to mail a 14 day ZIO XT monitor to her address on file.

## 2021-05-13 ENCOUNTER — Telehealth (HOSPITAL_COMMUNITY): Payer: Self-pay | Admitting: *Deleted

## 2021-05-13 NOTE — Telephone Encounter (Signed)
Close encounter 

## 2021-05-15 ENCOUNTER — Other Ambulatory Visit: Payer: Self-pay

## 2021-05-15 ENCOUNTER — Ambulatory Visit (HOSPITAL_COMMUNITY)
Admission: RE | Admit: 2021-05-15 | Discharge: 2021-05-15 | Disposition: A | Discharge status: Home / Self Care | Payer: Medicare Other | Source: Ambulatory Visit | Attending: Cardiology | Admitting: Cardiology

## 2021-05-15 DIAGNOSIS — R0789 Other chest pain: Secondary | ICD-10-CM | POA: Insufficient documentation

## 2021-05-15 DIAGNOSIS — R002 Palpitations: Secondary | ICD-10-CM | POA: Diagnosis not present

## 2021-05-15 LAB — MYOCARDIAL PERFUSION IMAGING
LV dias vol: 71 mL (ref 46–106)
LV sys vol: 25 mL
Nuc Stress EF: 64 %
Peak HR: 86 {beats}/min
Rest HR: 58 {beats}/min
Rest Nuclear Isotope Dose: 10.9 mCi
SDS: 0
SRS: 2
SSS: 2
ST Depression (mm): 0 mm
Stress Nuclear Isotope Dose: 31.8 mCi
TID: 1.11

## 2021-05-15 MED ORDER — TECHNETIUM TC 99M TETROFOSMIN IV KIT
31.8000 | PACK | Freq: Once | INTRAVENOUS | Status: AC | PRN
  Administered 2021-05-15: 31.8 via INTRAVENOUS
  Filled 2021-05-15: qty 32

## 2021-05-15 MED ORDER — AMINOPHYLLINE 25 MG/ML IV SOLN
75.0000 mg | Freq: Once | INTRAVENOUS | Status: AC
  Administered 2021-05-15: 75 mg via INTRAVENOUS

## 2021-05-15 MED ORDER — TECHNETIUM TC 99M TETROFOSMIN IV KIT
10.9000 | PACK | Freq: Once | INTRAVENOUS | Status: AC | PRN
  Administered 2021-05-15: 10.9 via INTRAVENOUS
  Filled 2021-05-15: qty 11

## 2021-05-15 MED ORDER — REGADENOSON 0.4 MG/5ML IV SOLN
0.4000 mg | Freq: Once | INTRAVENOUS | Status: AC
  Administered 2021-05-15: 0.4 mg via INTRAVENOUS

## 2021-05-19 DIAGNOSIS — M158 Other polyosteoarthritis: Secondary | ICD-10-CM | POA: Diagnosis not present

## 2021-05-19 DIAGNOSIS — I10 Essential (primary) hypertension: Secondary | ICD-10-CM | POA: Diagnosis not present

## 2021-05-19 DIAGNOSIS — E782 Mixed hyperlipidemia: Secondary | ICD-10-CM | POA: Diagnosis not present

## 2021-05-19 DIAGNOSIS — M179 Osteoarthritis of knee, unspecified: Secondary | ICD-10-CM | POA: Diagnosis not present

## 2021-05-19 DIAGNOSIS — K219 Gastro-esophageal reflux disease without esophagitis: Secondary | ICD-10-CM | POA: Diagnosis not present

## 2021-05-19 DIAGNOSIS — R300 Dysuria: Secondary | ICD-10-CM | POA: Diagnosis not present

## 2021-05-20 ENCOUNTER — Telehealth: Payer: Self-pay | Admitting: Cardiology

## 2021-05-20 NOTE — Telephone Encounter (Signed)
Patient was returning call 

## 2021-05-20 NOTE — Telephone Encounter (Signed)
Patient is returning a call to discuss stress test results.

## 2021-05-20 NOTE — Telephone Encounter (Signed)
Attempted to call patient, left message for patient to call back to office.   

## 2021-05-20 NOTE — Telephone Encounter (Signed)
Spoke with patient regarding the following results. Patient made aware and patient verbalized understanding.   Berniece Salines, DO  05/19/2021  3:56 PM EDT     Labs normal great news, stress test normal.   Advised patient to call back to office with any issues, questions, or concerns. Patient verbalized understanding.

## 2021-06-04 DIAGNOSIS — R002 Palpitations: Secondary | ICD-10-CM | POA: Diagnosis not present

## 2021-06-04 DIAGNOSIS — R350 Frequency of micturition: Secondary | ICD-10-CM | POA: Diagnosis not present

## 2021-06-04 DIAGNOSIS — I10 Essential (primary) hypertension: Secondary | ICD-10-CM | POA: Diagnosis not present

## 2021-06-04 DIAGNOSIS — R300 Dysuria: Secondary | ICD-10-CM | POA: Diagnosis not present

## 2021-06-04 DIAGNOSIS — R197 Diarrhea, unspecified: Secondary | ICD-10-CM | POA: Diagnosis not present

## 2021-06-06 ENCOUNTER — Other Ambulatory Visit: Payer: Self-pay

## 2021-06-06 MED ORDER — METOPROLOL SUCCINATE ER 25 MG PO TB24: 12.5000 mg | ORAL_TABLET | Freq: Every day | ORAL | 3 refills | Status: AC

## 2021-06-19 ENCOUNTER — Other Ambulatory Visit: Payer: Self-pay | Admitting: Internal Medicine

## 2021-06-19 DIAGNOSIS — Z1231 Encounter for screening mammogram for malignant neoplasm of breast: Secondary | ICD-10-CM

## 2021-06-20 DIAGNOSIS — R0989 Other specified symptoms and signs involving the circulatory and respiratory systems: Secondary | ICD-10-CM | POA: Diagnosis not present

## 2021-06-20 DIAGNOSIS — I10 Essential (primary) hypertension: Secondary | ICD-10-CM | POA: Diagnosis not present

## 2021-06-20 DIAGNOSIS — R300 Dysuria: Secondary | ICD-10-CM | POA: Diagnosis not present

## 2021-06-20 DIAGNOSIS — R42 Dizziness and giddiness: Secondary | ICD-10-CM | POA: Diagnosis not present

## 2021-06-20 DIAGNOSIS — R0602 Shortness of breath: Secondary | ICD-10-CM | POA: Diagnosis not present

## 2021-07-16 DIAGNOSIS — M179 Osteoarthritis of knee, unspecified: Secondary | ICD-10-CM | POA: Diagnosis not present

## 2021-07-16 DIAGNOSIS — K219 Gastro-esophageal reflux disease without esophagitis: Secondary | ICD-10-CM | POA: Diagnosis not present

## 2021-07-16 DIAGNOSIS — E782 Mixed hyperlipidemia: Secondary | ICD-10-CM | POA: Diagnosis not present

## 2021-07-16 DIAGNOSIS — E785 Hyperlipidemia, unspecified: Secondary | ICD-10-CM | POA: Diagnosis not present

## 2021-07-16 DIAGNOSIS — I10 Essential (primary) hypertension: Secondary | ICD-10-CM | POA: Diagnosis not present

## 2021-07-16 DIAGNOSIS — M158 Other polyosteoarthritis: Secondary | ICD-10-CM | POA: Diagnosis not present

## 2021-07-25 ENCOUNTER — Ambulatory Visit
Admission: RE | Admit: 2021-07-25 | Discharge: 2021-07-25 | Disposition: A | Discharge status: Home / Self Care | Payer: Medicare Other | Source: Ambulatory Visit | Attending: Internal Medicine | Admitting: Internal Medicine

## 2021-07-25 DIAGNOSIS — Z1231 Encounter for screening mammogram for malignant neoplasm of breast: Secondary | ICD-10-CM | POA: Diagnosis not present

## 2021-09-12 ENCOUNTER — Encounter: Payer: Self-pay | Admitting: Cardiology

## 2021-09-12 ENCOUNTER — Other Ambulatory Visit: Payer: Self-pay

## 2021-09-12 ENCOUNTER — Ambulatory Visit: Payer: Medicare Other | Admitting: Cardiology

## 2021-09-12 VITALS — BP 114/60 | HR 61 | Ht 66.0 in | Wt 156.8 lb

## 2021-09-12 DIAGNOSIS — E782 Mixed hyperlipidemia: Secondary | ICD-10-CM | POA: Diagnosis not present

## 2021-09-12 DIAGNOSIS — I739 Peripheral vascular disease, unspecified: Secondary | ICD-10-CM | POA: Diagnosis not present

## 2021-09-12 DIAGNOSIS — I10 Essential (primary) hypertension: Secondary | ICD-10-CM | POA: Diagnosis not present

## 2021-09-12 DIAGNOSIS — E663 Overweight: Secondary | ICD-10-CM | POA: Insufficient documentation

## 2021-09-12 DIAGNOSIS — R42 Dizziness and giddiness: Secondary | ICD-10-CM | POA: Insufficient documentation

## 2021-09-12 NOTE — Progress Notes (Signed)
Cardiology Office Note:    Date:  09/12/2021   ID:  Lindsey, Barnett 03/01/1935, MRN [id number]  PCP:  Josetta Huddle, MD  Cardiologist:  Berniece Salines, DO  Electrophysiologist:  None   Referring MD: Josetta Huddle, MD   " I am having dizziness"  History of Present Illness:    Lindsey Barnett is a 87 y.o. adult with a hx of hypertension, hyperlipidemia, GERD, history of CVA and TIA on aspirin and Plavix here today for follow-up visit.  First saw the patient on May 12, 2021 at that time she was experiencing significant palpitation, chest pain and shortness of breath.  I recommended I recommended a nuclear stress test as well as a ZIO monitor.  She was able to get her stress test done and wear her ZIO monitor.  Her stress test did not show any evidence of ischemia.  I have sent this information to the patient.  The monitor show evidence of paroxysmal supraventricular tachycardia and I recommended starting Toprol XL 12.5 mg daily.  She is here today to further discuss her testing result.  She tells me that she also has been experiencing intermittent dizziness- she notes that she also did have low blood pressure during that time.   She denies any chest pain or shortness of breath.  Past Medical History:  Diagnosis Date   Acute meniscal tear, lateral RIGHT   Arthritis HIP   Cataract immature    GERD (gastroesophageal reflux disease) OCCASIONAL   History of TIA (transient ischemic attack) 2005--  NO RESIDUAL   Hyperlipidemia    Hypertension    MVP (mitral valve prolapse)    Peripheral vascular disease (HCC)    Stress incontinence    Swelling of right knee joint     Past Surgical History:  Procedure Laterality Date   ANTERIOR APPROACH HEMI HIP ARTHROPLASTY Right 09/22/2015   Procedure: ANTERIOR APPROACH HEMI HIP ARTHROPLASTY;  Surgeon: Dorna Leitz, MD;  Location: [place];  Service: Orthopedics;  Laterality: Right;   BREAST CYST EXCISION Right    CHOLECYSTECTOMY  1975    KNEE ARTHROSCOPY  09/16/2011   Procedure: ARTHROSCOPY KNEE;  Surgeon: Gearlean Alf, MD;  Location: [facility];  Service: Orthopedics;  Laterality: Right;  WITH lateral meniscal debridement   TOTAL HIP ARTHROPLASTY Right 03/06/2016   Procedure: CONVERSION RIGHT HIP HEMIARTHROPLASTY TO RIGHT TOTAL HIP ARTHROPLASTY ANTERIOR APPROACH;  Surgeon: Mcarthur Rossetti, MD;  Location: [facility];  Service: Orthopedics;  Laterality: Right;   VAGINAL HYSTERECTOMY  1973    Current Medications: Current Meds  Medication Sig   acetaminophen (TYLENOL) 500 MG tablet Take 1,000 mg by mouth every 6 (six) hours as needed for mild pain.   amoxicillin (AMOXIL) 500 MG capsule Take 2,000 mg by mouth daily as needed (1 hour prior to dental appointments).   aspirin 81 MG tablet Take 81 mg by mouth once a week.    Cholecalciferol 2000 units CAPS Take 2,000 Units by mouth daily.   clopidogrel (PLAVIX) 75 MG tablet Take 75 mg by mouth daily.   Coenzyme Q10 (COQ10) 200 MG CAPS Take 200 mg by mouth daily.    losartan (COZAAR) 100 MG tablet Take 100 mg by mouth daily.   metoprolol succinate (TOPROL XL) 25 MG 24 hr tablet Take 0.5 tablets (12.5 mg total) by mouth daily.   Multiple Vitamin (MULTIVITAMIN) tablet Take 1 tablet by mouth at bedtime.    pantoprazole (PROTONIX) 40 MG tablet Take 1 tablet (40 mg  total) by mouth daily. (Patient taking differently: Take 40 mg by mouth as needed.)   rosuvastatin (CRESTOR) 10 MG tablet Take 10 mg by mouth 3 (three) times a week. MWF   [DISCONTINUED] amLODipine (NORVASC) 5 MG tablet Take 5 mg by mouth daily.     Allergies:   Codeine, Contrast media [iodinated contrast media], Iodine, and Sodium tetradecyl sulfate   Social History   Socioeconomic History   Marital status: Married    Spouse name: Not on file   Number of children: Not on file   Years of education: Not on file   Highest education level: Not on file  Occupational History   Not on file  Tobacco Use    Smoking status: Never   Smokeless tobacco: Never  Substance and Sexual Activity   Alcohol use: Yes    Comment: OCCASIONAL   Drug use: No   Sexual activity: Not on file  Other Topics Concern   Not on file  Social History Narrative   Not on file   Social Determinants of Health   Financial Resource Strain: Not on file  Food Insecurity: Not on file  Transportation Needs: Not on file  Physical Activity: Not on file  Stress: Not on file  Social Connections: Not on file     Family History: The patient's family history is negative for Breast cancer.  ROS:   Review of Systems  Constitution: Negative for decreased appetite, fever and weight gain.  HENT: Negative for congestion, ear discharge, hoarse voice and sore throat.   Eyes: Negative for discharge, redness, vision loss in right eye and visual halos.  Cardiovascular: Negative for chest pain, dyspnea on exertion, leg swelling, orthopnea and palpitations.  Respiratory: Negative for cough, hemoptysis, shortness of breath and snoring.   Endocrine: Negative for heat intolerance and polyphagia.  Hematologic/Lymphatic: Negative for bleeding problem. Does not bruise/bleed easily.  Skin: Negative for flushing, nail changes, rash and suspicious lesions.  Musculoskeletal: Negative for arthritis, joint pain, muscle cramps, myalgias, neck pain and stiffness.  Gastrointestinal: Negative for abdominal pain, bowel incontinence, diarrhea and excessive appetite.  Genitourinary: Negative for decreased libido, genital sores and incomplete emptying.  Neurological: Negative for brief paralysis, focal weakness, headaches and loss of balance.  Psychiatric/Behavioral: Negative for altered mental status, depression and suicidal ideas.  Allergic/Immunologic: Negative for HIV exposure and persistent infections.    EKGs/Labs/Other Studies Reviewed:    The following studies were reviewed today:  Her EKG:  None today  Lexiscan 05/15/2021   Findings  are consistent with no ischemia. The study is low risk.   No ST deviation was noted.   LV perfusion is normal. There is no evidence of ischemia. There is no evidence of infarction.   Left ventricular function is normal. Nuclear stress EF: 64 %. The left ventricular ejection fraction is normal (55-65%). End diastolic cavity size is normal. End systolic cavity size is normal.   Prior study available for comparison from 10/17/2003. Prior images unable to be viewed, noted EF 75%, small reversible area at the inferior apex   Normal LVEF and wall motion. Small apical inferior/inferolateral perfusion defect at rest that improves with stress, not consistent with ischemia.  Zio monitor Patch Wear Time:  13 days and 19 hours (2022-10-27T15:31:10-398 to 2022-11-10T09:36:27-0500)   Patient had a min HR of 53 bpm, max HR of 197 bpm, and avg HR of 77 bpm. Predominant underlying rhythm was Sinus Rhythm. First Degree AV Block was present. 60 Supraventricular Tachycardia runs occurred, the run  with the fastest interval lasting 18 beats  with a max rate of 197 bpm, the longest lasting 20 beats with an avg rate of 113 bpm. Isolated SVEs were rare (<1.0%), SVE Couplets were rare (<1.0%), and SVE Triplets were rare (<1.0%). Isolated VEs were rare (<1.0%), VE Couplets were rare (<1.0%), and  no VE Triplets were present. Conclusion: This study is remarkable for paroxysmal supraventricular tachycardia which is likely atrial tachycardia.    Echo 2020 IMPRESSIONS   1. Left ventricular ejection fraction, by visual estimation, is 60 to 65%. The left ventricle has normal function. There is no left ventricular  hypertrophy.   2. Global right ventricle has normal systolic function.The right ventricular size is normal. No increase in right ventricular wall  thickness.   3. Left atrial size was normal.   4. Right atrial size was normal.   5. The mitral valve is normal in structure. No evidence of mitral valve regurgitation.  No evidence of mitral stenosis.   6. The tricuspid valve is normal in structure. Tricuspid valve regurgitation is not demonstrated.   7. The aortic valve is normal in structure. Aortic valve regurgitation is not visualized. No evidence of aortic valve sclerosis or stenosis.   8. The pulmonic valve was normal in structure. Pulmonic valve regurgitation is trivial.   9. Normal pulmonary artery systolic pressure.  10. The atrial septum is grossly normal.   FINDINGS   Left Ventricle: Left ventricular ejection fraction, by visual estimation, is 60 to 65%. The left ventricle has normal function. There is no left ventricular hypertrophy.   Right Ventricle: The right ventricular size is normal. No increase in right ventricular wall thickness. Global RV systolic function is has  normal systolic function. The tricuspid regurgitant velocity is 2.58 m/s, and with an assumed right atrial pressure   of 3 mmHg, the estimated right ventricular systolic pressure is normal at 29.6 mmHg.   Left Atrium: Left atrial size was normal in size.   Right Atrium: Right atrial size was normal in size   Pericardium: There is no evidence of pericardial effusion.   Mitral Valve: The mitral valve is normal in structure. No evidence of mitral valve stenosis by observation. No evidence of mitral valve  regurgitation.   Tricuspid Valve: The tricuspid valve is normal in structure. Tricuspid valve regurgitation is not demonstrated.   Aortic Valve: The aortic valve is normal in structure. Aortic valve regurgitation is not visualized. The aortic valve is structurally normal,  with no evidence of sclerosis or stenosis.   Pulmonic Valve: The pulmonic valve was normal in structure. Pulmonic valve regurgitation is trivial.   Aorta: The aortic root and ascending aorta are structurally normal, with  no evidence of dilitation.   IAS/Shunts: The atrial septum is grossly normal.   Recent Labs: No results found for requested labs  within last 8760 hours.  Recent Lipid Panel    Component Value Date/Time   CHOL 213 (H) 06/23/2019 0426   TRIG 88 06/23/2019 0426   HDL 67 06/23/2019 0426   CHOLHDL 3.2 06/23/2019 0426   VLDL 18 06/23/2019 0426   LDLCALC 128 (H) 06/23/2019 0426    Physical Exam:    VS:  BP 114/60    Pulse 61    Ht 5\' 6"  (1.676 m)    Wt 156 lb 12.8 oz (71.1 kg)    SpO2 98%    BMI 25.31 kg/m     Wt Readings from Last 3 Encounters:  09/12/21 156 lb 12.8 oz (  71.1 kg)  05/15/21 158 lb (71.7 kg)  05/12/21 158 lb (71.7 kg)     GEN: Well nourished, well developed in no acute distress HEENT: Normal NECK: No JVD; No carotid bruits LYMPHATICS: No lymphadenopathy CARDIAC: S1S2 noted,RRR, no murmurs, rubs, gallops RESPIRATORY:  Clear to auscultation without rales, wheezing or rhonchi  ABDOMEN: Soft, non-tender, non-distended, +bowel sounds, no guarding. EXTREMITIES: No edema, No cyanosis, no clubbing MUSCULOSKELETAL:  No deformity  SKIN: Warm and dry NEUROLOGIC:  Alert and oriented x 3, non-focal PSYCHIATRIC:  Normal affect, good insight  ASSESSMENT:    1. Primary hypertension   2. PAD (peripheral artery disease) ([place])   3. Mixed hyperlipidemia   4. Dizziness   5. Overweight (BMI 25.0-29.9)    PLAN:    Hypertension-she currently is on losartan 100 mg daily, amlodipine 5 mg daily along with Toprol 12.5 which she started for PSVT.  Unfortunately she has had some low blood pressure and some dizziness.  She also tells me she is having some leg swelling.  At this time I would like to cut back on her antihypertensive medications stop the amlodipine.  I am hoping that this will still keep her below target.  I do suspect that her dizziness may not all be due to the hypotension giving the positional exacerbation that she tells me when she bends her head this causes issues.  This also may be due to underlying in her ear dysfunction.  I have asked the patient to talk to her PCP to have her see  ENT.  Peripheral artery disease-continuing the aspirin and statin.  Hyperlipidemia - continue with current statin medication.  The patient understands the need to lose weight with diet and exercise. We have discussed specific strategies for this.  We discussed her testing result.  Her ZIO monitor showed evidence of paroxysmal SVT she has been started on metoprolol succinate 12.5 before this visit.  Her Lexiscan did not show any evidence of ischemia.  No need for any further ischemic evaluation.   The patient is in agreement with the above plan. The patient left the office in stable condition.  The patient will follow up in 6 months or sooner if needed.   Medication Adjustments/Labs and Tests Ordered: Current medicines are reviewed at length with the patient today.  Concerns regarding medicines are outlined above.  No orders of the defined types were placed in this encounter.  No orders of the defined types were placed in this encounter.   Patient Instructions  Medication Instructions:  Your physician has recommended you make the following change in your medication:  STOP: Amlodipine *If you need a refill on your cardiac medications before your next appointment, please call your pharmacy*   Lab Work: None If you have labs (blood work) drawn today and your tests are completely normal, you will receive your results only by: [place] (if you have MyChart) OR A paper copy in the mail If you have any lab test that is abnormal or we need to change your treatment, we will call you to review the results.   Testing/Procedures: None   Follow-Up: At [facility], you and your health needs are our priority.  As part of our continuing mission to provide you with exceptional heart care, we have created designated Provider Care Teams.  These Care Teams include your primary Cardiologist (physician) and Advanced Practice Providers (APPs -  Physician Assistants and Nurse  Practitioners) who all work together to provide you with the care you  need, when you need it.  We recommend signing up for the patient portal called "MyChart".  Sign up information is provided on this After Visit Summary.  MyChart is used to connect with patients for Virtual Visits (Telemedicine).  Patients are able to view lab/test results, encounter notes, upcoming appointments, etc.  Non-urgent messages can be sent to your provider as well.   To learn more about what you can do with MyChart, go to [email or web address].    Your next appointment:   6 month(s)  The format for your next appointment:   In Person  Provider:   Berniece Salines, DO     Other Instructions     Adopting a Healthy Lifestyle.  Know what a healthy weight is for you (roughly BMI <25) and aim to maintain this   Aim for 7+ servings of fruits and vegetables daily   65-80+ fluid ounces of water or unsweet tea for healthy kidneys   Limit to max 1 drink of alcohol per day; avoid smoking/tobacco   Limit animal fats in diet for cholesterol and heart health - choose grass fed whenever available   Avoid highly processed foods, and foods high in saturated/trans fats   Aim for low stress - take time to unwind and care for your mental health   Aim for 150 min of moderate intensity exercise weekly for heart health, and weights twice weekly for bone health   Aim for 7-9 hours of sleep daily   When it comes to diets, agreement about the perfect plan isnt easy to find, even among the experts. Experts at the [employer] developed an idea known as the Healthy Eating Plate. Just imagine a plate divided into logical, healthy portions.   The emphasis is on diet quality:   Load up on vegetables and fruits - one-half of your plate: Aim for color and variety, and remember that potatoes dont count.   Go for whole grains - one-quarter of your plate: Whole wheat, barley, wheat berries, quinoa, oats, brown  rice, and foods made with them. If you want pasta, go with whole wheat pasta.   Protein power - one-quarter of your plate: Fish, chicken, beans, and nuts are all healthy, versatile protein sources. Limit red meat.   The diet, however, does go beyond the plate, offering a few other suggestions.   Use healthy plant oils, such as olive, canola, soy, corn, sunflower and peanut. Check the labels, and avoid partially hydrogenated oil, which have unhealthy trans fats.   If youre thirsty, drink water. Coffee and tea are good in moderation, but skip sugary drinks and limit milk and dairy products to one or two daily servings.   The type of carbohydrate in the diet is more important than the amount. Some sources of carbohydrates, such as vegetables, fruits, whole grains, and beans-are healthier than others.   Finally, stay active  Signed, Berniece Salines, DO  09/12/2021 8:41 PM    [facility]

## 2021-09-12 NOTE — Patient Instructions (Signed)
Medication Instructions:  Your physician has recommended you make the following change in your medication:  STOP: Amlodipine *If you need a refill on your cardiac medications before your next appointment, please call your pharmacy*   Lab Work: None If you have labs (blood work) drawn today and your tests are completely normal, you will receive your results only by: [place] (if you have MyChart) OR A paper copy in the mail If you have any lab test that is abnormal or we need to change your treatment, we will call you to review the results.   Testing/Procedures: None   Follow-Up: At [facility], you and your health needs are our priority.  As part of our continuing mission to provide you with exceptional heart care, we have created designated Provider Care Teams.  These Care Teams include your primary Cardiologist (physician) and Advanced Practice Providers (APPs -  Physician Assistants and Nurse Practitioners) who all work together to provide you with the care you need, when you need it.  We recommend signing up for the patient portal called "MyChart".  Sign up information is provided on this After Visit Summary.  MyChart is used to connect with patients for Virtual Visits (Telemedicine).  Patients are able to view lab/test results, encounter notes, upcoming appointments, etc.  Non-urgent messages can be sent to your provider as well.   To learn more about what you can do with MyChart, go to [email or web address].    Your next appointment:   6 month(s)  The format for your next appointment:   In Person  Provider:   Berniece Salines, DO     Other Instructions

## 2021-09-23 DIAGNOSIS — H5213 Myopia, bilateral: Secondary | ICD-10-CM | POA: Diagnosis not present

## 2021-09-23 DIAGNOSIS — I10 Essential (primary) hypertension: Secondary | ICD-10-CM | POA: Diagnosis not present

## 2021-09-23 DIAGNOSIS — H43393 Other vitreous opacities, bilateral: Secondary | ICD-10-CM | POA: Diagnosis not present

## 2021-09-23 DIAGNOSIS — H04123 Dry eye syndrome of bilateral lacrimal glands: Secondary | ICD-10-CM | POA: Diagnosis not present

## 2021-09-23 DIAGNOSIS — H52223 Regular astigmatism, bilateral: Secondary | ICD-10-CM | POA: Diagnosis not present

## 2021-09-23 DIAGNOSIS — H35033 Hypertensive retinopathy, bilateral: Secondary | ICD-10-CM | POA: Diagnosis not present

## 2021-09-23 DIAGNOSIS — H524 Presbyopia: Secondary | ICD-10-CM | POA: Diagnosis not present

## 2021-09-23 DIAGNOSIS — H43813 Vitreous degeneration, bilateral: Secondary | ICD-10-CM | POA: Diagnosis not present

## 2021-10-07 DIAGNOSIS — I10 Essential (primary) hypertension: Secondary | ICD-10-CM | POA: Diagnosis not present

## 2021-10-07 DIAGNOSIS — E782 Mixed hyperlipidemia: Secondary | ICD-10-CM | POA: Diagnosis not present

## 2021-11-07 ENCOUNTER — Other Ambulatory Visit: Payer: Self-pay | Admitting: Internal Medicine

## 2021-11-07 DIAGNOSIS — R209 Unspecified disturbances of skin sensation: Secondary | ICD-10-CM | POA: Diagnosis not present

## 2021-11-07 DIAGNOSIS — K219 Gastro-esophageal reflux disease without esophagitis: Secondary | ICD-10-CM | POA: Diagnosis not present

## 2021-11-07 DIAGNOSIS — I672 Cerebral atherosclerosis: Secondary | ICD-10-CM | POA: Diagnosis not present

## 2021-11-07 DIAGNOSIS — M8580 Other specified disorders of bone density and structure, unspecified site: Secondary | ICD-10-CM | POA: Diagnosis not present

## 2021-11-07 DIAGNOSIS — E559 Vitamin D deficiency, unspecified: Secondary | ICD-10-CM | POA: Diagnosis not present

## 2021-11-07 DIAGNOSIS — G4762 Sleep related leg cramps: Secondary | ICD-10-CM | POA: Diagnosis not present

## 2021-11-07 DIAGNOSIS — I872 Venous insufficiency (chronic) (peripheral): Secondary | ICD-10-CM | POA: Diagnosis not present

## 2021-11-07 DIAGNOSIS — I10 Essential (primary) hypertension: Secondary | ICD-10-CM | POA: Diagnosis not present

## 2021-11-13 ENCOUNTER — Telehealth: Payer: Self-pay | Admitting: *Deleted

## 2021-11-13 NOTE — Telephone Encounter (Signed)
The patient has requested to make a provider switch to Dr. Tamala Julian from Dr. Sherren Mocha related to her husband being a patient of Dr. Tamala Julian for many years.  ?

## 2021-12-18 DIAGNOSIS — R195 Other fecal abnormalities: Secondary | ICD-10-CM | POA: Diagnosis not present

## 2021-12-18 DIAGNOSIS — I10 Essential (primary) hypertension: Secondary | ICD-10-CM | POA: Diagnosis not present

## 2022-01-05 DIAGNOSIS — D225 Melanocytic nevi of trunk: Secondary | ICD-10-CM | POA: Diagnosis not present

## 2022-01-05 DIAGNOSIS — L309 Dermatitis, unspecified: Secondary | ICD-10-CM | POA: Diagnosis not present

## 2022-01-05 DIAGNOSIS — L821 Other seborrheic keratosis: Secondary | ICD-10-CM | POA: Diagnosis not present

## 2022-01-05 DIAGNOSIS — L578 Other skin changes due to chronic exposure to nonionizing radiation: Secondary | ICD-10-CM | POA: Diagnosis not present

## 2022-01-05 DIAGNOSIS — D1801 Hemangioma of skin and subcutaneous tissue: Secondary | ICD-10-CM | POA: Diagnosis not present

## 2022-01-09 DIAGNOSIS — I10 Essential (primary) hypertension: Secondary | ICD-10-CM | POA: Diagnosis not present

## 2022-01-09 DIAGNOSIS — E785 Hyperlipidemia, unspecified: Secondary | ICD-10-CM | POA: Diagnosis not present

## 2022-01-27 DIAGNOSIS — N390 Urinary tract infection, site not specified: Secondary | ICD-10-CM | POA: Diagnosis not present

## 2022-02-16 ENCOUNTER — Ambulatory Visit (INDEPENDENT_AMBULATORY_CARE_PROVIDER_SITE_OTHER): Payer: Medicare Other

## 2022-02-16 ENCOUNTER — Ambulatory Visit: Payer: Medicare Other | Admitting: Orthopaedic Surgery

## 2022-02-16 ENCOUNTER — Ambulatory Visit: Payer: Self-pay

## 2022-02-16 DIAGNOSIS — M1711 Unilateral primary osteoarthritis, right knee: Secondary | ICD-10-CM | POA: Diagnosis not present

## 2022-02-16 DIAGNOSIS — M1712 Unilateral primary osteoarthritis, left knee: Secondary | ICD-10-CM | POA: Diagnosis not present

## 2022-02-16 DIAGNOSIS — G8929 Other chronic pain: Secondary | ICD-10-CM

## 2022-02-16 DIAGNOSIS — M25561 Pain in right knee: Secondary | ICD-10-CM

## 2022-02-16 DIAGNOSIS — M25562 Pain in left knee: Secondary | ICD-10-CM

## 2022-02-16 MED ORDER — METHYLPREDNISOLONE ACETATE 40 MG/ML IJ SUSP
40.0000 mg | INTRAMUSCULAR | Status: AC | PRN
  Administered 2022-02-16: 40 mg via INTRA_ARTICULAR

## 2022-02-16 MED ORDER — LIDOCAINE HCL 1 % IJ SOLN
3.0000 mL | INTRAMUSCULAR | Status: AC | PRN
  Administered 2022-02-16: 3 mL

## 2022-02-16 NOTE — Progress Notes (Signed)
Office Visit Note   Patient: Lindsey Barnett           Date of Birth: [birth year]           MRN: [id number] Visit Date: 02/16/2022              Requested by: Josetta Huddle, MD [address] [place],  [place] [address] PCP: Josetta Huddle, MD   Assessment & Plan: Visit Diagnoses:  1. Chronic pain of left knee   2. Chronic pain of right knee   3. Unilateral primary osteoarthritis, left knee   4. Unilateral primary osteoarthritis, right knee     Plan:   I did place a steroid injection in both knees today which she tolerated well.  She did talk about having low back pain and difficulty with standing up from seated position.  She is very interested in outpatient physical therapy for strengthening her legs and her back.  I gave her prescription for [facility] physical therapy where she has been before.  She did tolerate the steroid injections well.  I would like to see her back in 4 weeks to see how she is doing overall from therapy and the injections and to determine whether or not she is a good candidate for hyaluronic acid for her knees.  She agrees with this treatment plan.  Follow-Up Instructions: Return in about 4 weeks (around 03/16/2022).   Orders:  Orders Placed This Encounter  Procedures   Large Joint Inj   Large Joint Inj   XR Knee 1-2 Views Left   XR Knee 1-2 Views Right   No orders of the defined types were placed in this encounter.     Procedures: Large Joint Inj: R knee on 02/16/2022 9:54 AM Indications: diagnostic evaluation and pain Details: 22 G 1.5 in needle, superolateral approach  Arthrogram: No  Medications: 3 mL lidocaine 1 %; 40 mg methylPREDNISolone acetate 40 MG/ML Outcome: tolerated well, no immediate complications Procedure, treatment alternatives, risks and benefits explained, specific risks discussed. Consent was given by the patient. Immediately prior to procedure a time out was called to verify the correct patient, procedure,  equipment, support staff and site/side marked as required. Patient was prepped and draped in the usual sterile fashion.    Large Joint Inj: L knee on 02/16/2022 9:54 AM Indications: diagnostic evaluation and pain Details: 22 G 1.5 in needle, superolateral approach  Arthrogram: No  Medications: 3 mL lidocaine 1 %; 40 mg methylPREDNISolone acetate 40 MG/ML Outcome: tolerated well, no immediate complications Procedure, treatment alternatives, risks and benefits explained, specific risks discussed. Consent was given by the patient. Immediately prior to procedure a time out was called to verify the correct patient, procedure, equipment, support staff and site/side marked as required. Patient was prepped and draped in the usual sterile fashion.       Clinical Data: No additional findings.   Subjective: Chief Complaint  Patient presents with   Left Knee - Pain   Right Knee - Pain  The patient is an 87 year old active female who comes in with a history of about 9 months of bilateral knee pain.  There has been some swelling and no known injury.  The pain does wake her up at night.  She does not walk with assistive device.  She is never had surgery on her knees and never had any type of injections.  She is not a diabetic but is on Plavix so she cannot take oral anti-inflammatories.  HPI  Review of Systems There is currently listed no fever, chills, nausea, vomiting  Objective: Vital Signs: There were no vitals taken for this visit.  Physical Exam She is alert and oriented x3 and in no acute distress Ortho Exam Examination of both knees shows slight valgus malalignment and mild effusion of both knees.  Both knees have excellent range of motion and are ligamentously stable but painful throughout the arc of motion. Specialty Comments:  No specialty comments available.  Imaging: XR Knee 1-2 Views Left  Result Date: 02/16/2022 2 views of the left knee show no acute findings.  There is  mild to moderate tricompartment arthritis with slight valgus malalignment.  XR Knee 1-2 Views Right  Result Date: 02/16/2022 2 views of the right knee show no acute findings.  There is slight valgus malalignment with lateral and patellofemoral narrowing.    PMFS History: Patient Active Problem List   Diagnosis Date Noted   Dizziness 09/12/2021   Overweight (BMI 25.0-29.9) 09/12/2021   Chest pain 06/22/2019   First degree AV block 06/22/2019   Uncontrolled hypertension 06/22/2019   HLD (hyperlipidemia) 06/22/2019   PAD (peripheral artery disease) ([place]) 06/22/2019   Impingement syndrome of right shoulder 12/22/2017   Sciatica, left side 09/08/2017   Pain due to right hip joint prosthesis ([place]) 03/06/2016   Status post total replacement of right hip 03/06/2016   Hip fracture requiring operative repair ([place]) 09/22/2015   Hip injury    Subcapital fracture of hip ([place]) 09/21/2015   Hypokalemia 09/21/2015   HTN (hypertension) 09/21/2015   GERD (gastroesophageal reflux disease) 09/21/2015   Lateral meniscal tear 09/16/2011   Past Medical History:  Diagnosis Date   Acute meniscal tear, lateral RIGHT   Arthritis HIP   Cataract immature    GERD (gastroesophageal reflux disease) OCCASIONAL   History of TIA (transient ischemic attack) 2005--  NO RESIDUAL   Hyperlipidemia    Hypertension    MVP (mitral valve prolapse)    Peripheral vascular disease (HCC)    Stress incontinence    Swelling of right knee joint     Family History  Problem Relation Age of Onset   Breast cancer Neg Hx     Past Surgical History:  Procedure Laterality Date   ANTERIOR APPROACH HEMI HIP ARTHROPLASTY Right 09/22/2015   Procedure: ANTERIOR APPROACH HEMI HIP ARTHROPLASTY;  Surgeon: Dorna Leitz, MD;  Location: [place];  Service: Orthopedics;  Laterality: Right;   BREAST CYST EXCISION Right    CHOLECYSTECTOMY  1975   KNEE ARTHROSCOPY  09/16/2011   Procedure: ARTHROSCOPY KNEE;  Surgeon: Gearlean Alf, MD;   Location: [facility];  Service: Orthopedics;  Laterality: Right;  WITH lateral meniscal debridement   TOTAL HIP ARTHROPLASTY Right 03/06/2016   Procedure: CONVERSION RIGHT HIP HEMIARTHROPLASTY TO RIGHT TOTAL HIP ARTHROPLASTY ANTERIOR APPROACH;  Surgeon: Mcarthur Rossetti, MD;  Location: [facility];  Service: Orthopedics;  Laterality: Right;   VAGINAL HYSTERECTOMY  1973   Social History   Occupational History   Not on file  Tobacco Use   Smoking status: Never   Smokeless tobacco: Never  Substance and Sexual Activity   Alcohol use: Yes    Comment: OCCASIONAL   Drug use: No   Sexual activity: Not on file

## 2022-02-18 DIAGNOSIS — L820 Inflamed seborrheic keratosis: Secondary | ICD-10-CM | POA: Diagnosis not present

## 2022-02-18 DIAGNOSIS — L570 Actinic keratosis: Secondary | ICD-10-CM | POA: Diagnosis not present

## 2022-02-24 DIAGNOSIS — M545 Low back pain, unspecified: Secondary | ICD-10-CM | POA: Diagnosis not present

## 2022-02-24 DIAGNOSIS — M170 Bilateral primary osteoarthritis of knee: Secondary | ICD-10-CM | POA: Diagnosis not present

## 2022-03-05 ENCOUNTER — Ambulatory Visit: Payer: Medicare Other | Admitting: Physician Assistant

## 2022-03-05 ENCOUNTER — Telehealth: Payer: Self-pay | Admitting: Orthopaedic Surgery

## 2022-03-05 NOTE — Telephone Encounter (Signed)
Spoke with [facility] and they will call patient

## 2022-03-05 NOTE — Telephone Encounter (Signed)
Unless it's in his box, we don't have it.

## 2022-03-05 NOTE — Telephone Encounter (Signed)
Pt called requesting a call back from Dr. Ninfa Linden or Renato Gails. Pt states Dr Ninfa Linden sent a referral for pt and they are waiting for a response from [facility] verbal orders for physical therapy. Pt phone number is [phone]

## 2022-03-15 NOTE — Progress Notes (Unsigned)
Cardiology Office Note:    Date:  03/16/2022   ID:  Lindsey Barnett, Lindsey Barnett 11-27-1934, MRN [id number]  PCP:  Lindsey Huddle, MD  Cardiologist:  Lindsey Grooms, MD   Referring MD: Lindsey Huddle, MD   Chief Complaint  Patient presents with   Hypertension   Hyperlipidemia    History of Present Illness:    Lindsey Barnett is a 87 y.o. adult with a hx of hypertension, hyperlipidemia, GERD, history of CVA and TIA on aspirin and Plavix, and palpitations..   I have known this issue for more than 25 years.  I take care of her husband.  In speaking with her today I cannot understand why she is here and she has no complaints.  I think she had a cough resolved with seeing me because taking care of her husband Lindsey Barnett.  Denies orthopnea, PND, syncope, palpitations, but does have occasional dizziness when she bends over.  She denies syncope.   Past Medical History:  Diagnosis Date   Acute meniscal tear, lateral RIGHT   Arthritis HIP   Cataract immature    GERD (gastroesophageal reflux disease) OCCASIONAL   History of TIA (transient ischemic attack) 2005--  NO RESIDUAL   Hyperlipidemia    Hypertension    MVP (mitral valve prolapse)    Peripheral vascular disease (HCC)    Stress incontinence    Swelling of right knee joint     Past Surgical History:  Procedure Laterality Date   ANTERIOR APPROACH HEMI HIP ARTHROPLASTY Right 09/22/2015   Procedure: ANTERIOR APPROACH HEMI HIP ARTHROPLASTY;  Surgeon: Dorna Leitz, MD;  Location: [place];  Service: Orthopedics;  Laterality: Right;   BREAST CYST EXCISION Right    CHOLECYSTECTOMY  1975   KNEE ARTHROSCOPY  09/16/2011   Procedure: ARTHROSCOPY KNEE;  Surgeon: Gearlean Alf, MD;  Location: [facility];  Service: Orthopedics;  Laterality: Right;  WITH lateral meniscal debridement   TOTAL HIP ARTHROPLASTY Right 03/06/2016   Procedure: CONVERSION RIGHT HIP HEMIARTHROPLASTY TO RIGHT TOTAL HIP ARTHROPLASTY ANTERIOR APPROACH;   Surgeon: Mcarthur Rossetti, MD;  Location: [facility];  Service: Orthopedics;  Laterality: Right;   VAGINAL HYSTERECTOMY  1973    Current Medications: Current Meds  Medication Sig   acetaminophen (TYLENOL) 500 MG tablet Take 1,000 mg by mouth every 6 (six) hours as needed for mild pain.   amLODipine (NORVASC) 5 MG tablet Take 2.5 mg by mouth daily.   amoxicillin (AMOXIL) 500 MG capsule Take 2,000 mg by mouth daily as needed (1 hour prior to dental appointments).   aspirin 81 MG tablet Take 81 mg by mouth once a week.    Cholecalciferol 2000 units CAPS Take 2,000 Units by mouth daily.   clopidogrel (PLAVIX) 75 MG tablet Take 75 mg by mouth daily.   Coenzyme Q10 (COQ10) 200 MG CAPS Take 200 mg by mouth daily.    losartan (COZAAR) 100 MG tablet Take 100 mg by mouth daily.   metoprolol succinate (TOPROL XL) 25 MG 24 hr tablet Take 0.5 tablets (12.5 mg total) by mouth daily.   Multiple Vitamin (MULTIVITAMIN) tablet Take 1 tablet by mouth at bedtime.    pantoprazole (PROTONIX) 40 MG tablet Take 1 tablet (40 mg total) by mouth daily.   rosuvastatin (CRESTOR) 10 MG tablet Take 10 mg by mouth 3 (three) times a week. MWF     Allergies:   Codeine, Contrast media [iodinated contrast media], Iodine, and Sodium tetradecyl sulfate   Social History  Socioeconomic History   Marital status: Married    Spouse name: Not on file   Number of children: Not on file   Years of education: Not on file   Highest education level: Not on file  Occupational History   Not on file  Tobacco Use   Smoking status: Never   Smokeless tobacco: Never  Substance and Sexual Activity   Alcohol use: Yes    Comment: OCCASIONAL   Drug use: No   Sexual activity: Not on file  Other Topics Concern   Not on file  Social History Narrative   Not on file   Social Determinants of Health   Financial Resource Strain: Not on file  Food Insecurity: Not on file  Transportation Needs: Not on file  Physical Activity: Not on  file  Stress: Not on file  Social Connections: Not on file     Family History: The patient's family history is negative for Breast cancer.  ROS:   Please see the history of present illness.    Decreased memory I can tell from conversation.  All other systems reviewed and are negative.  EKGs/Labs/Other Studies Reviewed:    The following studies were reviewed today:   2 Week Monitor 05/2021:   Patch Wear Time:  13 days and 19 hours (2022-10-27T15:31:10-398 to 2022-11-10T09:36:27-0500)   Patient had a min HR of 53 bpm, max HR of 197 bpm, and avg HR of 77 bpm. Predominant underlying rhythm was Sinus Rhythm. First Degree AV Block was present. 60 Supraventricular Tachycardia runs occurred, the run with the fastest interval lasting 18 beats  with a max rate of 197 bpm, the longest lasting 20 beats with an avg rate of 113 bpm. Isolated SVEs were rare (<1.0%), SVE Couplets were rare (<1.0%), and SVE Triplets were rare (<1.0%). Isolated VEs were rare (<1.0%), VE Couplets were rare (<1.0%), and  no VE Triplets were present.     Conclusion: This study is remarkable for paroxysmal supraventricular tachycardia which is likely atrial tachycardia.  EKG:  EKG first-degree AV block, low voltage, PR 224 ms, small inferior Q waves?.  Compared to the prior tracing performed May 12, 2021, there is no significant change.  Recent Labs: No results found for requested labs within last 365 days.  Recent Lipid Panel    Component Value Date/Time   CHOL 213 (H) 06/23/2019 0426   TRIG 88 06/23/2019 0426   HDL 67 06/23/2019 0426   CHOLHDL 3.2 06/23/2019 0426   VLDL 18 06/23/2019 0426   LDLCALC 128 (H) 06/23/2019 0426    Physical Exam:    VS:  BP (!) 142/80   Pulse 66   Ht '5\' 7"'$  (1.702 m)   Wt 153 lb (69.4 kg)   SpO2 98%   BMI 23.96 kg/m     Wt Readings from Last 3 Encounters:  03/16/22 153 lb (69.4 kg)  09/12/21 156 lb 12.8 oz (71.1 kg)  05/15/21 158 lb (71.7 kg)     GEN: Compatible  with age. No acute distress HEENT: Normal NECK: No JVD. LYMPHATICS: No lymphadenopathy CARDIAC: No murmur. RRR no gallop, or edema. VASCULAR:  Normal Pulses. No bruits. RESPIRATORY:  Clear to auscultation without rales, wheezing or rhonchi  ABDOMEN: Soft, non-tender, non-distended, No pulsatile mass, MUSCULOSKELETAL: No deformity  SKIN: Warm and dry NEUROLOGIC:  Alert and oriented x 3 PSYCHIATRIC:  Normal affect   ASSESSMENT:    1. Primary hypertension   2. PAD (peripheral artery disease) ([place])   3. Mixed hyperlipidemia  4. Palpitations    PLAN:    In order of problems listed above:  Adequate blood pressure control for age, target 140/80 mmHg. No claudication Continue Crestor 10 mg/day.  Followed by primary care, Dr. Mertha Finders. Palpitations I will monitor which was reviewed above have decreased palpitations.  She does have first-degree AV block.  We need to be careful with Toprol-XL but 12.5 mg seems to be well-tolerated.  No change in therapy.   Overall doing well.  Not sure if there is a real reason to have to have recurrent cardiology follow-up.  I did inform her that I am retiring.  I recommended that she go back to Dr. Harriet Masson if she needs cardiology follow-up.   Medication Adjustments/Labs and Tests Ordered: Current medicines are reviewed at length with the patient today.  Concerns regarding medicines are outlined above.  Orders Placed This Encounter  Procedures   EKG 12-Lead   No orders of the defined types were placed in this encounter.   Patient Instructions  Medication Instructions:  Your physician recommends that you continue on your current medications as directed. Please refer to the Current Medication list given to you today.  *If you need a refill on your cardiac medications before your next appointment, please call your pharmacy*  Lab Work: NONE  Testing/Procedures: NONE  Follow-Up: As needed  Important Information About Sugar          Signed, Lindsey Grooms, MD  03/16/2022 9:41 AM    [place]

## 2022-03-16 ENCOUNTER — Encounter: Payer: Self-pay | Admitting: Interventional Cardiology

## 2022-03-16 ENCOUNTER — Telehealth: Payer: Self-pay

## 2022-03-16 ENCOUNTER — Ambulatory Visit: Payer: Medicare Other | Admitting: Orthopaedic Surgery

## 2022-03-16 ENCOUNTER — Ambulatory Visit: Payer: Medicare Other | Attending: Interventional Cardiology | Admitting: Interventional Cardiology

## 2022-03-16 ENCOUNTER — Encounter: Payer: Self-pay | Admitting: Orthopaedic Surgery

## 2022-03-16 VITALS — BP 142/80 | HR 66 | Ht 67.0 in | Wt 153.0 lb

## 2022-03-16 DIAGNOSIS — R002 Palpitations: Secondary | ICD-10-CM

## 2022-03-16 DIAGNOSIS — M25561 Pain in right knee: Secondary | ICD-10-CM

## 2022-03-16 DIAGNOSIS — M1711 Unilateral primary osteoarthritis, right knee: Secondary | ICD-10-CM

## 2022-03-16 DIAGNOSIS — M25562 Pain in left knee: Secondary | ICD-10-CM

## 2022-03-16 DIAGNOSIS — M1712 Unilateral primary osteoarthritis, left knee: Secondary | ICD-10-CM

## 2022-03-16 DIAGNOSIS — I739 Peripheral vascular disease, unspecified: Secondary | ICD-10-CM | POA: Diagnosis not present

## 2022-03-16 DIAGNOSIS — I10 Essential (primary) hypertension: Secondary | ICD-10-CM | POA: Diagnosis not present

## 2022-03-16 DIAGNOSIS — G8929 Other chronic pain: Secondary | ICD-10-CM

## 2022-03-16 DIAGNOSIS — E782 Mixed hyperlipidemia: Secondary | ICD-10-CM

## 2022-03-16 NOTE — Patient Instructions (Signed)
Medication Instructions:  Your physician recommends that you continue on your current medications as directed. Please refer to the Current Medication list given to you today.  *If you need a refill on your cardiac medications before your next appointment, please call your pharmacy*  Lab Work: NONE  Testing/Procedures: NONE  Follow-Up: As needed  Important Information About Sugar       

## 2022-03-16 NOTE — Progress Notes (Signed)
The patient has well-documented osteoarthritis of both her knees with the left much worse than the right.  Her x-rays show valgus malalignment of the left knee with significant cartilage loss of all 3 compartments.  This is the same on the right knee but this hurts her last.  She has had 2 weeks ago her left knee was very swollen.  I have placed steroid injections in both knees back in July and this has not helped her at all.  She still wants to pursue conservative nontreatment options and I agree with this.  She is an active 87 year old female but still wants to avoid knee replacement surgery.  Her left knee today does have a mild effusion.  There is valgus malalignment.  There is medial lateral joint line tenderness.  There is not significant malalignment of the right knee and no effusion of the right knee today.  The next step would be trying hyaluronic acid for both knees to treat the pain from osteoarthritis given the failure of steroid injections.  She agrees with this treatment plan.  We will see her back in follow-up to hopefully place hyaluronic acid in both knees once it is approved and ordered.

## 2022-03-16 NOTE — Telephone Encounter (Signed)
Bilateral knee gel injections 

## 2022-03-20 NOTE — Telephone Encounter (Signed)
VOB submitted for Durolane, bilateral knee.  

## 2022-04-01 DIAGNOSIS — H53143 Visual discomfort, bilateral: Secondary | ICD-10-CM | POA: Diagnosis not present

## 2022-04-01 DIAGNOSIS — Z9849 Cataract extraction status, unspecified eye: Secondary | ICD-10-CM | POA: Diagnosis not present

## 2022-04-01 DIAGNOSIS — Z961 Presence of intraocular lens: Secondary | ICD-10-CM | POA: Diagnosis not present

## 2022-04-24 ENCOUNTER — Ambulatory Visit
Admission: RE | Admit: 2022-04-24 | Discharge: 2022-04-24 | Disposition: A | Discharge status: Home / Self Care | Payer: Medicare Other | Source: Ambulatory Visit | Attending: Internal Medicine | Admitting: Internal Medicine

## 2022-04-24 DIAGNOSIS — M8588 Other specified disorders of bone density and structure, other site: Secondary | ICD-10-CM | POA: Diagnosis not present

## 2022-04-24 DIAGNOSIS — Z780 Asymptomatic menopausal state: Secondary | ICD-10-CM | POA: Diagnosis not present

## 2022-04-24 DIAGNOSIS — M810 Age-related osteoporosis without current pathological fracture: Secondary | ICD-10-CM | POA: Diagnosis not present

## 2022-04-24 DIAGNOSIS — M8580 Other specified disorders of bone density and structure, unspecified site: Secondary | ICD-10-CM

## 2022-05-04 ENCOUNTER — Other Ambulatory Visit: Payer: Self-pay

## 2022-05-04 DIAGNOSIS — M1712 Unilateral primary osteoarthritis, left knee: Secondary | ICD-10-CM

## 2022-05-04 DIAGNOSIS — M1711 Unilateral primary osteoarthritis, right knee: Secondary | ICD-10-CM

## 2022-05-08 DIAGNOSIS — M810 Age-related osteoporosis without current pathological fracture: Secondary | ICD-10-CM | POA: Diagnosis not present

## 2022-05-08 DIAGNOSIS — R29898 Other symptoms and signs involving the musculoskeletal system: Secondary | ICD-10-CM | POA: Diagnosis not present

## 2022-05-18 ENCOUNTER — Ambulatory Visit: Payer: Medicare Other | Admitting: Orthopaedic Surgery

## 2022-05-18 ENCOUNTER — Encounter: Payer: Self-pay | Admitting: Orthopaedic Surgery

## 2022-05-18 DIAGNOSIS — M1711 Unilateral primary osteoarthritis, right knee: Secondary | ICD-10-CM | POA: Diagnosis not present

## 2022-05-18 DIAGNOSIS — M170 Bilateral primary osteoarthritis of knee: Secondary | ICD-10-CM | POA: Diagnosis not present

## 2022-05-18 DIAGNOSIS — M1712 Unilateral primary osteoarthritis, left knee: Secondary | ICD-10-CM | POA: Diagnosis not present

## 2022-05-18 MED ORDER — SODIUM HYALURONATE 60 MG/3ML IX PRSY
60.0000 mg | PREFILLED_SYRINGE | INTRA_ARTICULAR | Status: AC | PRN
  Administered 2022-05-18: 60 mg via INTRA_ARTICULAR

## 2022-05-18 NOTE — Progress Notes (Signed)
   Procedure Note  Patient: Lindsey Barnett             Date of Birth: 08-07-1934           MRN: [id number]             Visit Date: 05/18/2022  Procedures: Visit Diagnoses:  1. Unilateral primary osteoarthritis, left knee   2. Unilateral primary osteoarthritis, right knee     Large Joint Inj: R knee on 05/18/2022 9:02 AM Indications: diagnostic evaluation and pain Details: 22 G 1.5 in needle, superolateral approach  Arthrogram: No  Medications: 60 mg Sodium Hyaluronate 60 MG/3ML Outcome: tolerated well, no immediate complications Procedure, treatment alternatives, risks and benefits explained, specific risks discussed. Consent was given by the patient. Immediately prior to procedure a time out was called to verify the correct patient, procedure, equipment, support staff and site/side marked as required. Patient was prepped and draped in the usual sterile fashion.    Large Joint Inj: L knee on 05/18/2022 9:03 AM Indications: diagnostic evaluation and pain Details: 22 G 1.5 in needle, superolateral approach  Arthrogram: No  Medications: 60 mg Sodium Hyaluronate 60 MG/3ML Outcome: tolerated well, no immediate complications Procedure, treatment alternatives, risks and benefits explained, specific risks discussed. Consent was given by the patient. Immediately prior to procedure a time out was called to verify the correct patient, procedure, equipment, support staff and site/side marked as required. Patient was prepped and draped in the usual sterile fashion.    Lot# [id number]  The patient is here today for scheduled hyaluronic acid injections in both knees to treat the pain from osteoarthritis.  This is with Durolane.  She is 87 years old.  She has valgus malalignment of both knees and significant arthritis.  She has tried and failed all forms of conservative treatment including steroid injections.  She has had no other acute change in her medical status.  Examination of her knees  today shows no effusion.  Both knees have valgus malalignment with patellofemoral crepitation and lateral tenderness.  I did place Durolane in both knees today without difficulty.  All questions and concerns were answered and addressed.  Follow-up is as needed.  If this helps we can always do this again in his little 6 months.  If it does not help at all other treatment options are steroid injections and considering knee replacement surgery.

## 2022-05-20 DIAGNOSIS — Z79899 Other long term (current) drug therapy: Secondary | ICD-10-CM | POA: Diagnosis not present

## 2022-05-20 DIAGNOSIS — K219 Gastro-esophageal reflux disease without esophagitis: Secondary | ICD-10-CM | POA: Diagnosis not present

## 2022-05-20 DIAGNOSIS — K529 Noninfective gastroenteritis and colitis, unspecified: Secondary | ICD-10-CM | POA: Diagnosis not present

## 2022-05-20 DIAGNOSIS — I10 Essential (primary) hypertension: Secondary | ICD-10-CM | POA: Diagnosis not present

## 2022-05-20 DIAGNOSIS — E782 Mixed hyperlipidemia: Secondary | ICD-10-CM | POA: Diagnosis not present

## 2022-05-20 DIAGNOSIS — M170 Bilateral primary osteoarthritis of knee: Secondary | ICD-10-CM | POA: Diagnosis not present

## 2022-05-20 DIAGNOSIS — Z8673 Personal history of transient ischemic attack (TIA), and cerebral infarction without residual deficits: Secondary | ICD-10-CM | POA: Diagnosis not present

## 2022-05-20 DIAGNOSIS — M810 Age-related osteoporosis without current pathological fracture: Secondary | ICD-10-CM | POA: Diagnosis not present

## 2022-05-20 DIAGNOSIS — Z0000 Encounter for general adult medical examination without abnormal findings: Secondary | ICD-10-CM | POA: Diagnosis not present

## 2022-05-20 DIAGNOSIS — E559 Vitamin D deficiency, unspecified: Secondary | ICD-10-CM | POA: Diagnosis not present

## 2022-07-01 ENCOUNTER — Telehealth: Payer: Self-pay

## 2022-07-01 NOTE — Telephone Encounter (Signed)
Patient called stating that her knees have been swollen since she received the gel injection and now her ankles and feet are swollen too.  Pateint has an appt.on Monday, 07/06/2022, but would like a call back to discuss.  Cb# [phone].

## 2022-07-01 NOTE — Telephone Encounter (Signed)
Patient aware of the below message  

## 2022-07-01 NOTE — Telephone Encounter (Signed)
error 

## 2022-07-06 ENCOUNTER — Ambulatory Visit: Payer: Medicare Other | Admitting: Physician Assistant

## 2022-07-06 DIAGNOSIS — M7989 Other specified soft tissue disorders: Secondary | ICD-10-CM

## 2022-07-06 DIAGNOSIS — I83893 Varicose veins of bilateral lower extremities with other complications: Secondary | ICD-10-CM

## 2022-07-06 DIAGNOSIS — M1711 Unilateral primary osteoarthritis, right knee: Secondary | ICD-10-CM

## 2022-07-06 DIAGNOSIS — M1712 Unilateral primary osteoarthritis, left knee: Secondary | ICD-10-CM | POA: Diagnosis not present

## 2022-07-06 NOTE — Progress Notes (Unsigned)
  HPI: Lindsey Barnett returns today stating that she has had swelling in both legs that is particularly worse over the last week but was unable to get and she states to see [place] in the office.  She last had gel injections in both knees on 05/18/2022.  She has known osteoarthritis both knees.  She denies any new injuries.  She has been taking occasional Celebrex for the pain in her legs and knees out by however she is on chronic Plavix.  No history of DVT.  History of peripheral artery disease.  She states that her swelling is improving.   Review of systems: See HPI otherwise negative   Physical exam: Bilateral lower extremities with brawny changes secondary to venous insufficiency.  Dorsal pedal pulses are present bilaterally.  Calves supple right calf with considerable discomfort with palpation.  No pain in the left calf.  +1 pitting edema both legs.   Impression: Bilateral lower extremity peripheral edema Osteoarthritis bilateral knees   Plan: Given the calf pain we will obtain Dopplers to rule out DVT however highly unlikely given the fact that she is on Plavix.  Suggest she wear compression hose.  In regards to her knees I have no other recommendations outside of the knee replacement which she is not interested in at this point in time.  Will call her after her Doppler to go over results and discuss further follow-up

## 2022-07-07 ENCOUNTER — Encounter: Payer: Self-pay | Admitting: Physician Assistant

## 2022-07-07 ENCOUNTER — Ambulatory Visit (HOSPITAL_COMMUNITY)
Admission: RE | Admit: 2022-07-07 | Discharge: 2022-07-07 | Disposition: A | Discharge status: Home / Self Care | Payer: Medicare Other | Source: Ambulatory Visit | Attending: Physician Assistant | Admitting: Physician Assistant

## 2022-07-07 DIAGNOSIS — M7989 Other specified soft tissue disorders: Secondary | ICD-10-CM | POA: Insufficient documentation

## 2022-07-07 NOTE — Progress Notes (Signed)
Bilateral lower extremity venous duplex has been completed. Preliminary results can be found in CV Proc through chart review.  Results were given to Friend at [facility] office.  07/07/22 11:02 AM Carlos [place] RVT

## 2022-07-12 DIAGNOSIS — J069 Acute upper respiratory infection, unspecified: Secondary | ICD-10-CM | POA: Diagnosis not present

## 2022-07-15 DIAGNOSIS — R051 Acute cough: Secondary | ICD-10-CM | POA: Diagnosis not present

## 2022-07-16 DIAGNOSIS — K219 Gastro-esophageal reflux disease without esophagitis: Secondary | ICD-10-CM | POA: Diagnosis not present

## 2022-07-16 DIAGNOSIS — M810 Age-related osteoporosis without current pathological fracture: Secondary | ICD-10-CM | POA: Diagnosis not present

## 2022-07-16 DIAGNOSIS — I10 Essential (primary) hypertension: Secondary | ICD-10-CM | POA: Diagnosis not present

## 2022-07-16 DIAGNOSIS — E785 Hyperlipidemia, unspecified: Secondary | ICD-10-CM | POA: Diagnosis not present

## 2022-07-24 DIAGNOSIS — R053 Chronic cough: Secondary | ICD-10-CM | POA: Diagnosis not present

## 2022-08-13 ENCOUNTER — Emergency Department (HOSPITAL_COMMUNITY): Payer: Medicare Other

## 2022-08-13 ENCOUNTER — Other Ambulatory Visit: Payer: Self-pay

## 2022-08-13 ENCOUNTER — Encounter (HOSPITAL_COMMUNITY): Payer: Self-pay | Admitting: Emergency Medicine

## 2022-08-13 ENCOUNTER — Emergency Department (HOSPITAL_COMMUNITY)
Admission: EM | Admit: 2022-08-13 | Discharge: 2022-08-14 | Disposition: A | Discharge status: Home / Self Care | Payer: Medicare Other | Attending: Emergency Medicine | Admitting: Emergency Medicine

## 2022-08-13 DIAGNOSIS — R0602 Shortness of breath: Secondary | ICD-10-CM | POA: Diagnosis not present

## 2022-08-13 DIAGNOSIS — R42 Dizziness and giddiness: Secondary | ICD-10-CM | POA: Insufficient documentation

## 2022-08-13 DIAGNOSIS — Z79899 Other long term (current) drug therapy: Secondary | ICD-10-CM | POA: Diagnosis not present

## 2022-08-13 DIAGNOSIS — Z7982 Long term (current) use of aspirin: Secondary | ICD-10-CM | POA: Insufficient documentation

## 2022-08-13 DIAGNOSIS — R791 Abnormal coagulation profile: Secondary | ICD-10-CM | POA: Insufficient documentation

## 2022-08-13 DIAGNOSIS — I10 Essential (primary) hypertension: Secondary | ICD-10-CM | POA: Diagnosis not present

## 2022-08-13 DIAGNOSIS — R0600 Dyspnea, unspecified: Secondary | ICD-10-CM | POA: Diagnosis not present

## 2022-08-13 DIAGNOSIS — Z7902 Long term (current) use of antithrombotics/antiplatelets: Secondary | ICD-10-CM | POA: Insufficient documentation

## 2022-08-13 DIAGNOSIS — R079 Chest pain, unspecified: Secondary | ICD-10-CM | POA: Diagnosis not present

## 2022-08-13 DIAGNOSIS — R7989 Other specified abnormal findings of blood chemistry: Secondary | ICD-10-CM | POA: Diagnosis not present

## 2022-08-13 DIAGNOSIS — R0789 Other chest pain: Secondary | ICD-10-CM | POA: Insufficient documentation

## 2022-08-13 LAB — BASIC METABOLIC PANEL
Anion gap: 7 (ref 5–15)
BUN: 17 mg/dL (ref 8–23)
CO2: 25 mmol/L (ref 22–32)
Calcium: 9.6 mg/dL (ref 8.9–10.3)
Chloride: 108 mmol/L (ref 98–111)
Creatinine, Ser: 0.77 mg/dL (ref 0.44–1.00)
GFR, Estimated: >60 mL/min (ref >60)
Glucose, Bld: 106 mg/dL — ABNORMAL HIGH (ref 70–99)
Potassium: 3.7 mmol/L (ref 3.5–5.1)
Sodium: 140 mmol/L (ref 135–145)

## 2022-08-13 LAB — CBC
HCT: 42.9 % (ref 36.0–46.0)
Hemoglobin: 13.4 g/dL (ref 12.0–15.0)
MCH: 28.2 pg (ref 26.0–34.0)
MCHC: 31.2 g/dL (ref 30.0–36.0)
MCV: 90.1 fL (ref 80.0–100.0)
Platelets: 302 10*3/uL (ref 150–400)
RBC: 4.76 MIL/uL (ref 3.87–5.11)
RDW: 14.2 % (ref 11.5–15.5)
WBC: 7.8 10*3/uL (ref 4.0–10.5)
nRBC: 0 % (ref 0.0–0.2)

## 2022-08-13 LAB — TROPONIN I (HIGH SENSITIVITY)
Troponin I (High Sensitivity): 6 ng/L (ref <18)
Troponin I (High Sensitivity): 6 ng/L (ref <18)

## 2022-08-13 LAB — BRAIN NATRIURETIC PEPTIDE: B Natriuretic Peptide: 142.5 pg/mL — ABNORMAL HIGH (ref 0.0–100.0)

## 2022-08-13 LAB — D-DIMER, QUANTITATIVE: D-Dimer, Quant: 1.29 ug/mL-FEU — ABNORMAL HIGH (ref 0.00–0.50)

## 2022-08-13 MED ORDER — DIPHENHYDRAMINE HCL 25 MG PO CAPS
25.0000 mg | ORAL_CAPSULE | Freq: Once | ORAL | Status: AC
  Administered 2022-08-14: 25 mg via ORAL
  Filled 2022-08-13: qty 1

## 2022-08-13 MED ORDER — METHYLPREDNISOLONE SODIUM SUCC 40 MG IJ SOLR
40.0000 mg | Freq: Once | INTRAMUSCULAR | Status: AC
  Administered 2022-08-13: 40 mg via INTRAVENOUS
  Filled 2022-08-13: qty 1

## 2022-08-13 NOTE — ED Triage Notes (Signed)
Pt reports SOB, dizziness and CP starting last night. When she went to get the paper today, she was so out of breath she could barely get back inside. Reports PNA at end of December. No pain at this time.

## 2022-08-13 NOTE — ED Provider Triage Note (Signed)
Emergency Medicine Provider Triage Evaluation Note  Lindsey Barnett , a 88 y.o. adult  was evaluated in triage.  Pt complains of chest pain.  Started last night.  Patient was working in the garden started to feel dizzy with chest pain and shortness of breath.  She sat down her symptoms improved.  Chest pain is in the left side of her chest and feels like heaviness and radiates to her back.  Symptoms are worse with exertion.  States she was diagnosed with pneumonia on Dec 27 but finished her course of antibiotics.  Denies calf tenderness and long trips.  Review of Systems  Positive: See above Negative: See above  Physical Exam  BP (!) 141/90 (BP Location: Right Arm)   Pulse 79   Temp 98.5 F (36.9 C)   Resp 18   Ht '5\' 7"'$  (1.702 m)   Wt 70 kg   SpO2 98%   BMI 24.17 kg/m  Gen:   Awake, no distress   Resp:  Normal effort  MSK:   Moves extremities without difficulty  Other:    Medical Decision Making  Medically screening exam initiated at 11:46 AM.  Appropriate orders placed.  Lindsey Barnett was informed that the remainder of the evaluation will be completed by another provider, this initial triage assessment does not replace that evaluation, and the importance of remaining in the ED until their evaluation is complete.  Work up started   Harriet Pho, PA-C 08/13/22 1148

## 2022-08-13 NOTE — ED Provider Notes (Signed)
[facility] Provider Note   CSN: [id number] Arrival date & time: 08/13/22  1136     History  Chief Complaint  Patient presents with   Chest Pain   Shortness of Breath    Lindsey Barnett is a 88 y.o. adult with HTN, HLD, PAD, GERD, dizziness who presents with CP, SOB.   Patient complains of acute onset chest pain and shortness of breath that occurred this morning. Patient was working in the garden started to feel dizzy with chest pain and shortness of breath.  She sat down her symptoms improved somewhat but she still feels a "heaviness on her chest.".  Chest pain is in the left side of her chest and feels like heaviness and radiates to her back.  Symptoms are worse with exertion.  States she was diagnosed with pneumonia on Dec 27 but finished her course of antibiotics.  She has never had anything happen like that before.  Patient endorses periodic LEE that is sometimes worse on R and sometimes worse on L.  She has no recent hospitalizations or surgeries, no recent long trips.  She does not take any hormones and has never had a clot or MI that she is aware of.   Chest Pain Associated symptoms: shortness of breath   Shortness of Breath Associated symptoms: chest pain        Home Medications Prior to Admission medications   Medication Sig Start Date End Date Taking? Authorizing Provider  acetaminophen (TYLENOL) 500 MG tablet Take 1,000 mg by mouth every 6 (six) hours as needed for mild pain.    [provider]  amLODipine (NORVASC) 5 MG tablet Take 2.5 mg by mouth daily.    [provider]  amoxicillin (AMOXIL) 500 MG capsule Take 2,000 mg by mouth daily as needed (1 hour prior to dental appointments).    [provider]  aspirin 81 MG tablet Take 81 mg by mouth once a week.     [provider]  Cholecalciferol 2000 units CAPS Take 2,000 Units by mouth daily.    [provider]  clopidogrel  (PLAVIX) 75 MG tablet Take 75 mg by mouth daily.    [provider]  Coenzyme Q10 (COQ10) 200 MG CAPS Take 200 mg by mouth daily.     [provider]  losartan (COZAAR) 100 MG tablet Take 100 mg by mouth daily. 03/31/21   [provider]  metoprolol succinate (TOPROL XL) 25 MG 24 hr tablet Take 0.5 tablets (12.5 mg total) by mouth daily. 06/06/21   Tobb, Kardie, DO  Multiple Vitamin (MULTIVITAMIN) tablet Take 1 tablet by mouth at bedtime.     [provider]  pantoprazole (PROTONIX) 40 MG tablet Take 1 tablet (40 mg total) by mouth daily. 06/23/19   Geradine Girt, DO  rosuvastatin (CRESTOR) 10 MG tablet Take 10 mg by mouth 3 (three) times a week. MWF    [provider]      Allergies    Codeine, Contrast media [iodinated contrast media], Iodine, and Sodium tetradecyl sulfate    Review of Systems   Review of Systems  Respiratory:  Positive for shortness of breath.   Cardiovascular:  Positive for chest pain.   Review of systems Negative for f/c.  A 10 point review of systems was performed and is negative unless otherwise reported in HPI.  Physical Exam Updated Vital Signs BP (!) 150/71   Pulse (!) 58   Temp 98.6  F (37 C) (Oral)   Resp 14   Ht 5' 7"$  (1.702 m)   Wt 70 kg   SpO2 98%   BMI 24.17 kg/m  Physical Exam General: Normal appearing female, lying in bed.  HEENT: Sclera anicteric, MMM, trachea midline.  Cardiology: RRR, no murmurs/rubs/gallops. BL radial and DP pulses equal bilaterally.  Resp: Normal respiratory rate and effort. CTAB, no wheezes, rhonchi, crackles.  Abd: Soft, non-tender, non-distended. No rebound tenderness or guarding.  GU: Deferred. MSK: No peripheral edema or signs of trauma. Extremities without deformity or TTP. No cyanosis or clubbing. Skin: warm, dry. No rashes or lesions. Back: No CVA tenderness Neuro: A&Ox4, CNs II-XII grossly intact. MAEs. Sensation grossly intact.  Psych: Normal mood and affect.    ED Results / Procedures / Treatments   Labs (all labs ordered are listed, but only abnormal results are displayed) Labs Reviewed  BASIC METABOLIC PANEL - Abnormal; Notable for the following components:      Result Value   Glucose, Bld 106 (*)    All other components within normal limits  BRAIN NATRIURETIC PEPTIDE - Abnormal; Notable for the following components:   B Natriuretic Peptide 142.5 (*)    All other components within normal limits  D-DIMER, QUANTITATIVE - Abnormal; Notable for the following components:   D-Dimer, Quant 1.29 (*)    All other components within normal limits  CBC  TROPONIN I (HIGH SENSITIVITY)  TROPONIN I (HIGH SENSITIVITY)  TROPONIN I (HIGH SENSITIVITY)    EKG EKG Interpretation  Date/Time:  Thursday August 13 2022 11:23:14 EST Ventricular Rate:  72 PR Interval:  218 QRS Duration: 70 QT Interval:  390 QTC Calculation: 427 R Axis:   32 Text Interpretation: Sinus rhythm with 1st degree A-V block Low voltage QRS Similar to prior Confirmed by Cindee Lame [phone]) on 08/13/2022 7:21:02 PM  Radiology DG Chest 1 View  Result Date: 08/13/2022 CLINICAL DATA:  Chest pain EXAM: CHEST  1 VIEW COMPARISON:  07/15/2022 FINDINGS: Normal heart size, mediastinal contours, and pulmonary vascularity. Lungs clear. No pulmonary infiltrate, pleural effusion, or pneumothorax. Osseous structures unremarkable. IMPRESSION: No acute abnormalities. Electronically Signed   By: Lavonia Dana M.D.   On: 08/13/2022 12:51    Procedures Procedures    Medications Ordered in ED Medications  methylPREDNISolone sodium succinate (SOLU-MEDROL) 40 mg/mL injection 40 mg (40 mg Intravenous Given 08/13/22 2145)  diphenhydrAMINE (BENADRYL) capsule 25 mg (25 mg Oral Given 08/14/22 0015)    ED Course/ Medical Decision Making/ A&P                          Medical Decision Making Amount and/or Complexity of Data Reviewed Labs: ordered. Decision-making details documented in ED  Course. Radiology: ordered. Decision-making details documented in ED Course.  Risk Prescription drug management.    This patient presents to the ED for concern of CP, SOB, this involves an extensive number of treatment options, and is a complaint that carries with it a high risk of complications and morbidity.  I considered the following differential and admission for this acute, potentially life threatening condition.   MDM:    DDX for chest pain includes but is not limited to:  ACS/arrhythmia,  PE, aortic dissection, PNA, PTX, PUD/gastritis, or musculoskeletal pain. Very low suspicion for ACS vs aortic dissection given presenting sx. Patient cannot PERC out based on age, but will obtain D dimer and reassess, minimal risk factors for PE, though she does report periodic asymmetric  LEE. She has no signs or symptoms of DVT currently, do not believe an ultrasound is warranted. Low c/f aortic dissection though she does report the pain radiates to her back. No widened mediastinum on CXR.  No abdominal pain and no c/f biliary disease.    Clinical Course as of 08/14/22 0020  Thu Aug 13, 2022  1920 Troponin I (High Sensitivity): 6 [HN]  1920 B Natriuretic Peptide(!): 142.5 [HN]  123456 Basic metabolic panel(!) unremarkable [HN]  1920 CBC wnl [HN]  1920 DG Chest 1 View FINDINGS: Normal heart size, mediastinal contours, and pulmonary vascularity.  Lungs clear.  No pulmonary infiltrate, pleural effusion, or pneumothorax.  Osseous structures unremarkable.  IMPRESSION: No acute abnormalities.   [HN]  2130 D-Dimer, Quant(!): 1.29 [HN]    Clinical Course User Index [HN] Audley Hose, MD    Labs: I Ordered, and personally interpreted labs.  The pertinent results include:  those listed above  Imaging Studies ordered: Triage ordered imaging studies including CXR, I ordered CT PE I independently visualized and interpreted imaging. I agree with the radiologist  interpretation  Additional history obtained from chart review  Cardiac Monitoring: The patient was maintained on a cardiac monitor.  I personally viewed and interpreted the cardiac monitored which showed an underlying rhythm of: NSR w/ 1st degree block  Reevaluation: After the interventions noted above, I reevaluated the patient and found that they have :improved  Social Determinants of Health: Patient lives independently  Disposition:  Patient is noted to have elevated D-dimer. Patient signed out to the oncoming ED physician Dr. Leonette Monarch who is made aware of her history, presentation, exam, workup, and plan. Plan is to obtain CT PE after steroid/benadryl given for contrast allergy emergency prep. If negative, patient can f/u with her cardiologist.  She states that her cardiologist recently retired so a new ambulatory referral is placed.   Co morbidities that complicate the patient evaluation  Past Medical History:  Diagnosis Date   Acute meniscal tear, lateral RIGHT   Arthritis HIP   Cataract immature    GERD (gastroesophageal reflux disease) OCCASIONAL   History of TIA (transient ischemic attack) 2005--  NO RESIDUAL   Hyperlipidemia    Hypertension    MVP (mitral valve prolapse)    Peripheral vascular disease (HCC)    Stress incontinence    Swelling of right knee joint      Medicines Meds ordered this encounter  Medications   methylPREDNISolone sodium succinate (SOLU-MEDROL) 40 mg/mL injection 40 mg    IV methylprednisolone will be converted to either a q12h or q24h frequency with the same total daily dose (TDD).  Ordered Dose: 1 to 125 mg TDD; convert to: TDD q24h.  Ordered Dose: 126 to 250 mg TDD; convert to: TDD div q12h.  Ordered Dose: >250 mg TDD; DAW.   diphenhydrAMINE (BENADRYL) capsule 25 mg    I have reviewed the patients home medicines and have made adjustments as needed  Problem List / ED Course: Problem List Items Addressed This Visit       Other   Chest  pain - Primary   Relevant Orders   Ambulatory referral to Cardiology                This note was created using dictation software, which may contain spelling or grammatical errors.    Audley Hose, MD 09/03/22 [phone]

## 2022-08-14 ENCOUNTER — Emergency Department (HOSPITAL_COMMUNITY): Payer: Medicare Other

## 2022-08-14 DIAGNOSIS — R079 Chest pain, unspecified: Secondary | ICD-10-CM | POA: Diagnosis not present

## 2022-08-14 DIAGNOSIS — R7989 Other specified abnormal findings of blood chemistry: Secondary | ICD-10-CM | POA: Diagnosis not present

## 2022-08-14 DIAGNOSIS — R0600 Dyspnea, unspecified: Secondary | ICD-10-CM | POA: Diagnosis not present

## 2022-08-14 LAB — TROPONIN I (HIGH SENSITIVITY): Troponin I (High Sensitivity): 6 ng/L (ref <18)

## 2022-08-14 MED ORDER — IOHEXOL 350 MG/ML SOLN
75.0000 mL | Freq: Once | INTRAVENOUS | Status: AC | PRN
  Administered 2022-08-14: 75 mL via INTRAVENOUS

## 2022-08-14 NOTE — ED Provider Notes (Signed)
I assumed care of this patient.  Please see previous provider note for further details of Hx, PE.  Briefly patient is a 88 y.o. adult who presented 1 day of chest pain.  Initial workup reassuring including EKG and x 2 troponins. Patient does have elevated D-dimer but lower than her prior.  Pending a CT PE study. Also plan to repeat troponin.  2:36 AM CT PE negative Troponin flat  On reassessment pain is completely resolved.  The patient appears reasonably screened and/or stabilized for discharge and I doubt any other medical condition or other [facility] requiring further screening, evaluation, or treatment in the ED at this time. I have discussed the findings, Dx and Tx plan with the patient/family who expressed understanding and agree(s) with the plan. Discharge instructions discussed at length. The patient/family was given strict return precautions who verbalized understanding of the instructions. No further questions at time of discharge.  Disposition: Discharge  Condition: Good  ED Discharge Orders          Ordered    Ambulatory referral to Cardiology       Comments: If you have not heard from the Cardiology office within the next 72 hours please call [phone].   08/14/22 0021              Follow Up: Charlane Ferretti, MD [address] [place] [address] [phone]  Call  to schedule an appointment for close follow up  [place] [address] [phone] Call  to schedule an appointment for close follow up        Fatima Blank, MD 08/14/22 [phone]

## 2022-08-14 NOTE — ED Notes (Signed)
Patient returned from CT at this time. Placed patient back on monitoring equipment. Patient denies need at this time.

## 2022-08-14 NOTE — ED Notes (Signed)
Patient transported to CT 

## 2022-08-17 DIAGNOSIS — T7840XA Allergy, unspecified, initial encounter: Secondary | ICD-10-CM | POA: Diagnosis not present

## 2022-08-26 ENCOUNTER — Other Ambulatory Visit: Payer: Self-pay | Admitting: Internal Medicine

## 2022-08-26 DIAGNOSIS — Z1231 Encounter for screening mammogram for malignant neoplasm of breast: Secondary | ICD-10-CM

## 2022-08-27 ENCOUNTER — Ambulatory Visit: Payer: Medicare Other | Attending: Internal Medicine | Admitting: Internal Medicine

## 2022-08-27 ENCOUNTER — Encounter: Payer: Self-pay | Admitting: Internal Medicine

## 2022-08-27 VITALS — BP 156/76 | HR 57 | Ht 67.0 in | Wt 153.0 lb

## 2022-08-27 DIAGNOSIS — I739 Peripheral vascular disease, unspecified: Secondary | ICD-10-CM

## 2022-08-27 DIAGNOSIS — E782 Mixed hyperlipidemia: Secondary | ICD-10-CM

## 2022-08-27 DIAGNOSIS — I10 Essential (primary) hypertension: Secondary | ICD-10-CM | POA: Diagnosis not present

## 2022-08-27 MED ORDER — EZETIMIBE 10 MG PO TABS: 10.0000 mg | ORAL_TABLET | Freq: Every day | ORAL | 3 refills | Status: DC

## 2022-08-27 MED ORDER — IRBESARTAN 300 MG PO TABS: 300.0000 mg | ORAL_TABLET | Freq: Every day | ORAL | 3 refills | Status: DC

## 2022-08-27 NOTE — Progress Notes (Signed)
Cardiology Office Note:    Date:  08/27/2022   ID:  Tyanne, Derocher 12/12/1934, MRN [id number]  PCP:  Charlane Ferretti, MD   [facility] Cardiologist:  Sinclair Grooms, MD (Inactive)     Referring MD: Charlane Ferretti, MD   CC:  Transition to new cardiologist  History of Present Illness:    Temperence Zenor is a 88 y.o. adult with a hx of Non cardiac dizziness (extensive work up with no cardiac etiology, decrease in BP meds did not resolve this), PAD (w/o claudication on ASA and statin, HLD and prior TIA, HTN, and P-SVT.  Patient notes that she is doing not as well as she would like to be.   Since last visit notes that she had ED visit with labile BP and chest pain. Elevated D-Dimer. CTPE negative. She has a itching to contrast after and is starting prednisone.  No further chest pain. Since this hospitalization. Notes worsening leg swelling She has noted more bleeding and bruising. No SOB.  No weight gain.  She doesn't feel well and thinks her medication plan needs changed. She is not taking the 2.5 mg of norvasc but is taking 5 mg  Past Medical History:  Diagnosis Date   Acute meniscal tear, lateral RIGHT   Arthritis HIP   Cataract immature    GERD (gastroesophageal reflux disease) OCCASIONAL   History of TIA (transient ischemic attack) 2005--  NO RESIDUAL   Hyperlipidemia    Hypertension    MVP (mitral valve prolapse)    Peripheral vascular disease (HCC)    Stress incontinence    Swelling of right knee joint     Past Surgical History:  Procedure Laterality Date   ANTERIOR APPROACH HEMI HIP ARTHROPLASTY Right 09/22/2015   Procedure: ANTERIOR APPROACH HEMI HIP ARTHROPLASTY;  Surgeon: Dorna Leitz, MD;  Location: [facility];  Service: Orthopedics;  Laterality: Right;   BREAST CYST EXCISION Right    CHOLECYSTECTOMY  1975   KNEE ARTHROSCOPY  09/16/2011   Procedure: ARTHROSCOPY KNEE;  Surgeon: Gearlean Alf, MD;  [facility]: [facility];  Service: Orthopedics;  Laterality: Right;  WITH lateral meniscal debridement   TOTAL HIP ARTHROPLASTY Right 03/06/2016   Procedure: CONVERSION RIGHT HIP HEMIARTHROPLASTY TO RIGHT TOTAL HIP ARTHROPLASTY ANTERIOR APPROACH;  Surgeon: Mcarthur Rossetti, MD;  Location: [facility];  Service: Orthopedics;  Laterality: Right;   VAGINAL HYSTERECTOMY  1973    Current Medications: Current Meds  Medication Sig   acetaminophen (TYLENOL) 500 MG tablet Take 1,000 mg by mouth every 6 (six) hours as needed for mild pain.   amLODipine (NORVASC) 5 MG tablet Take 2.5 mg by mouth daily.   amoxicillin (AMOXIL) 500 MG capsule Take 2,000 mg by mouth daily as needed (1 hour prior to dental appointments).   Cholecalciferol 2000 units CAPS Take 2,000 Units by mouth daily.   clopidogrel (PLAVIX) 75 MG tablet Take 75 mg by mouth daily.   Coenzyme Q10 (COQ10) 200 MG CAPS Take 200 mg by mouth daily.    ezetimibe (ZETIA) 10 MG tablet Take 1 tablet (10 mg total) by mouth daily.   irbesartan (AVAPRO) 300 MG tablet Take 1 tablet (300 mg total) by mouth daily.   metoprolol succinate (TOPROL XL) 25 MG 24 hr tablet Take 0.5 tablets (12.5 mg total) by mouth daily.   Multiple Vitamin (MULTIVITAMIN) tablet Take 1 tablet by mouth at bedtime.    pantoprazole (PROTONIX) 40 MG tablet Take 1 tablet (  40 mg total) by mouth daily.   rosuvastatin (CRESTOR) 10 MG tablet Take 10 mg by mouth 3 (three) times a week. MWF   [DISCONTINUED] aspirin 81 MG tablet Take 81 mg by mouth once a week.    [DISCONTINUED] losartan (COZAAR) 100 MG tablet Take 100 mg by mouth daily.     Allergies:   Codeine, Contrast media [iodinated contrast media], Iodine, and Sodium tetradecyl sulfate   Social History   Socioeconomic History   Marital status: Married    Spouse name: Not on file   Number of children: Not on file   Years of education: Not on file   Highest education level: Not on file  Occupational History   Not on file  Tobacco Use    Smoking status: Never   Smokeless tobacco: Never  Substance and Sexual Activity   Alcohol use: Yes    Comment: OCCASIONAL   Drug use: No   Sexual activity: Not on file  Other Topics Concern   Not on file  Social History Narrative   Not on file   Social Determinants of Health   Financial Resource Strain: Not on file  Food Insecurity: Not on file  Transportation Needs: Not on file  Physical Activity: Not on file  Stress: Not on file  Social Connections: Not on file    Family History: The patient's family history is negative for Breast cancer.  ROS:   Please see the history of present illness.     All other systems reviewed and are negative.  EKGs/Labs/Other Studies Reviewed:    The following studies were reviewed today:  EKG:  EKG is  ordered today.  The ekg ordered today demonstrates  08/27/2022: Sinus bradycardia 1st AV block  Cardiac Studies & Procedures     STRESS TESTS  MYOCARDIAL PERFUSION IMAGING 05/15/2021  Narrative   Findings are consistent with no ischemia. The study is low risk.   No ST deviation was noted.   LV perfusion is normal. There is no evidence of ischemia. There is no evidence of infarction.   Left ventricular function is normal. Nuclear stress EF: 64 %. The left ventricular ejection fraction is normal (55-65%). End diastolic cavity size is normal. End systolic cavity size is normal.   Prior study available for comparison from 10/17/2003. Prior images unable to be viewed, noted EF 75%, small reversible area at the inferior apex  Normal LVEF and wall motion. Small apical inferior/inferolateral perfusion defect at rest that improves with stress, not consistent with ischemia.   ECHOCARDIOGRAM  ECHOCARDIOGRAM COMPLETE 06/23/2019  Narrative ECHOCARDIOGRAM REPORT    Patient Name:   ILENA DIECKMAN Date of Exam: 06/23/2019 Medical Rec #:  [id number]          Height:       64.0 in Accession #:    [id number]         Weight:       158.5 lb Date of  Birth:  05-18-1935          BSA:          1.77 m Patient Age:    31 years           BP:           148/69 mmHg Patient Gender: F                  HR:           55 bpm. Exam Location:  Inpatient  Procedure: 2D Echo  Indications:    chest pain 786.50  History:        Patient has prior history of Echocardiogram examinations, most recent 10/16/2003. Arrythmias:first degree av block, Signs/Symptoms:Chest Pain; Risk Factors:Dyslipidemia and Hypertension.  Sonographer:    Johny Chess Referring Phys: [id number] [place]   1. Left ventricular ejection fraction, by visual estimation, is 60 to 65%. The left ventricle has normal function. There is no left ventricular hypertrophy. 2. Global right ventricle has normal systolic function.The right ventricular size is normal. No increase in right ventricular wall thickness. 3. Left atrial size was normal. 4. Right atrial size was normal. 5. The mitral valve is normal in structure. No evidence of mitral valve regurgitation. No evidence of mitral stenosis. 6. The tricuspid valve is normal in structure. Tricuspid valve regurgitation is not demonstrated. 7. The aortic valve is normal in structure. Aortic valve regurgitation is not visualized. No evidence of aortic valve sclerosis or stenosis. 8. The pulmonic valve was normal in structure. Pulmonic valve regurgitation is trivial. 9. Normal pulmonary artery systolic pressure. 10. The atrial septum is grossly normal.  FINDINGS Left Ventricle: Left ventricular ejection fraction, by visual estimation, is 60 to 65%. The left ventricle has normal function. There is no left ventricular hypertrophy.  Right Ventricle: The right ventricular size is normal. No increase in right ventricular wall thickness. Global RV systolic function is has normal systolic function. The tricuspid regurgitant velocity is 2.58 m/s, and with an assumed right atrial pressure of 3 mmHg, the estimated right  ventricular systolic pressure is normal at 29.6 mmHg.  Left Atrium: Left atrial size was normal in size.  Right Atrium: Right atrial size was normal in size  Pericardium: There is no evidence of pericardial effusion.  Mitral Valve: The mitral valve is normal in structure. No evidence of mitral valve stenosis by observation. No evidence of mitral valve regurgitation.  Tricuspid Valve: The tricuspid valve is normal in structure. Tricuspid valve regurgitation is not demonstrated.  Aortic Valve: The aortic valve is normal in structure. Aortic valve regurgitation is not visualized. The aortic valve is structurally normal, with no evidence of sclerosis or stenosis.  Pulmonic Valve: The pulmonic valve was normal in structure. Pulmonic valve regurgitation is trivial.  Aorta: The aortic root and ascending aorta are structurally normal, with no evidence of dilitation.  IAS/Shunts: The atrial septum is grossly normal.   LEFT VENTRICLE PLAX 2D LVIDd:         4.50 cm  Diastology LVIDs:         3.00 cm  LV e' lateral:   5.98 cm/s LV PW:         0.90 cm  LV E/e' lateral: 12.5 LV IVS:        0.80 cm  LV e' medial:    6.09 cm/s LVOT diam:     1.60 cm  LV E/e' medial:  12.3 LV SV:         57 ml LV SV Index:   31.57 LVOT Area:     2.01 cm   RIGHT VENTRICLE RV S prime:     12.60 cm/s TAPSE (M-mode): 2.0 cm  LEFT ATRIUM             Index       RIGHT ATRIUM           Index LA diam:        3.30 cm 1.86 cm/m  RA Area:     10.40 cm LA Vol (A2C):  33.7 ml 19.02 ml/m RA Volume:   19.80 ml  11.17 ml/m LA Vol (A4C):   32.0 ml 18.06 ml/m LA Biplane Vol: 33.7 ml 19.02 ml/m AORTIC VALVE LVOT Vmax:   97.30 cm/s LVOT Vmean:  69.400 cm/s LVOT VTI:    0.237 m  AORTA Ao Root diam: 2.60 cm  MITRAL VALVE                        TRICUSPID VALVE MV Area (PHT): 2.42 cm             TR Peak grad:   26.6 mmHg MV PHT:        90.77 msec           TR Vmax:        258.00 cm/s MV Decel Time: 313 msec MV  E velocity: 75.00 cm/s 103 cm/s  SHUNTS MV A velocity: 85.70 cm/s 70.3 cm/s Systemic VTI:  0.24 m MV E/A ratio:  0.88       1.5       Systemic Diam: 1.60 cm   Mertie Moores MD Electronically signed by Mertie Moores MD Signature Date/Time: 06/23/2019/4:00:12 PM    Final    MONITORS  LONG TERM MONITOR (3-14 DAYS) 06/05/2021  Narrative Patch Wear Time:  13 days and 19 hours (2022-10-27T15:31:10-398 to 2022-11-10T09:36:27-0500)  Patient had a min HR of 53 bpm, max HR of 197 bpm, and avg HR of 77 bpm. Predominant underlying rhythm was Sinus Rhythm. First Degree AV Block was present. 60 Supraventricular Tachycardia runs occurred, the run with the fastest interval lasting 18 beats with a max rate of 197 bpm, the longest lasting 20 beats with an avg rate of 113 bpm. Isolated SVEs were rare (<1.0%), SVE Couplets were rare (<1.0%), and SVE Triplets were rare (<1.0%). Isolated VEs were rare (<1.0%), VE Couplets were rare (<1.0%), and no VE Triplets were present.   Conclusion: This study is remarkable for paroxysmal supraventricular tachycardia which is likely atrial tachycardia.             Recent Labs: 08/13/2022: B Natriuretic Peptide 142.5; BUN 17; Creatinine, Ser 0.77; Hemoglobin 13.4; Platelets 302; Potassium 3.7; Sodium 140  Recent Lipid Panel    Component Value Date/Time   CHOL 213 (H) 06/23/2019 0426   TRIG 88 06/23/2019 0426   HDL 67 06/23/2019 0426   CHOLHDL 3.2 06/23/2019 0426   VLDL 18 06/23/2019 0426   LDLCALC 128 (H) 06/23/2019 0426     Risk Assessment/Calculations:      HYPERTENSION CONTROL Vitals:   08/27/22 1433 08/27/22 1731  BP: (!) 160/72 (!) 156/76    The patient's blood pressure is elevated above target today.  In order to address the patient's elevated BP: A new medication was prescribed today.          Physical Exam:    VS:  BP (!) 156/76   Pulse (!) 57   Ht '5\' 7"'$  (1.702 m)   Wt 153 lb (69.4 kg)   SpO2 98%   BMI 23.96 kg/m     Wt  Readings from Last 3 Encounters:  08/27/22 153 lb (69.4 kg)  08/13/22 154 lb 5.2 oz (70 kg)  03/16/22 153 lb (69.4 kg)    GEN:  Well nourished, well developed in no acute distress HEENT: Normal NECK: No JVD CARDIAC: RRR, no murmurs, rubs, gallops RESPIRATORY:  Clear to auscultation without rales, wheezing or rhonchi  ABDOMEN: Soft, non-tender, non-distended MUSCULOSKELETAL:  No edema;  No deformity  SKIN: Warm and dry NEUROLOGIC:  Alert and oriented x 3 PSYCHIATRIC:  Normal affect   ASSESSMENT:    1. Primary hypertension   2. Mixed hyperlipidemia   3. PAD (peripheral artery disease) (HCC)    PLAN:     HTN - Continue norvasc - will change losartan to irbesartan - has close f/u with PCP; will send records - given leg swelling next mediation will be diuretic (if nothing else to wean off norvasc)  PAD HLD Statin myopathy - will stopped DAPT and change to just plavix - continue highest tolerate statin and add zetia 10 mg  P-SVT - continue metoprolol   Non Cardiac Dizziness - planned f/u with PCP and ENT  Pharm D f/u Me in 6 months     Time Spent Directly with Patient:   I have spent a total of 40 minutes with the patient reviewing notes, imaging, EKGs, labs and examining the patient as well as establishing an assessment and plan that was discussed personally with the patient.  > 50% of time was spent in direct patient care.   Medication Adjustments/Labs and Tests Ordered: Current medicines are reviewed at length with the patient today.  Concerns regarding medicines are outlined above.  Orders Placed This Encounter  Procedures   AMB Referral to [facility]   Meds ordered this encounter  Medications   ezetimibe (ZETIA) 10 MG tablet    Sig: Take 1 tablet (10 mg total) by mouth daily.    Dispense:  90 tablet    Refill:  3   irbesartan (AVAPRO) 300 MG tablet    Sig: Take 1 tablet (300 mg total) by mouth daily.    Dispense:  90 tablet    Refill:  3     Patient Instructions  Medication Instructions:  Your physician has recommended you make the following change in your medication:   Stop aspirin Stop losartan Start irbesartan 300 mg daily  Verify dosage of amlodipine as either 2.5 mg or 5 mg daily  *If you need a refill on your cardiac medications before your next appointment, please call your pharmacy*  Follow-Up: At [facility], you and your health needs are our priority.  As part of our continuing mission to provide you with exceptional heart care, we have created designated Provider Care Teams.  These Care Teams include your primary Cardiologist (physician) and Advanced Practice Providers (APPs -  Physician Assistants and Nurse Practitioners) who all work together to provide you with the care you need, when you need it.   Your next appointment:   6 month(s)  Provider:   Dr. Gasper Sells     Signed, Werner Lean, MD  08/27/2022 5:39 PM    [place]

## 2022-08-27 NOTE — Patient Instructions (Addendum)
Medication Instructions:  Your physician has recommended you make the following change in your medication:   Stop aspirin Stop losartan Start irbesartan 300 mg daily  Verify dosage of amlodipine as either 2.5 mg or 5 mg daily  *If you need a refill on your cardiac medications before your next appointment, please call your pharmacy*  Follow-Up: At [facility], you and your health needs are our priority.  As part of our continuing mission to provide you with exceptional heart care, we have created designated Provider Care Teams.  These Care Teams include your primary Cardiologist (physician) and Advanced Practice Providers (APPs -  Physician Assistants and Nurse Practitioners) who all work together to provide you with the care you need, when you need it.   Your next appointment:   6 month(s)  Provider:   Dr. Gasper Sells

## 2022-08-28 ENCOUNTER — Telehealth: Payer: Self-pay | Admitting: Internal Medicine

## 2022-08-28 DIAGNOSIS — R0982 Postnasal drip: Secondary | ICD-10-CM | POA: Diagnosis not present

## 2022-08-28 DIAGNOSIS — R5383 Other fatigue: Secondary | ICD-10-CM | POA: Diagnosis not present

## 2022-08-28 DIAGNOSIS — I10 Essential (primary) hypertension: Secondary | ICD-10-CM | POA: Diagnosis not present

## 2022-08-28 NOTE — Telephone Encounter (Signed)
Left a message to call back.  Marton Redwood for amlodipine clarification.  Pt takes 5 mg PO QD.  I informed her that Dr. Gasper Sells stopped Aspirin and losartan yesterday.  Pt is to start irbesartan 300 mg PO QD.  Advised to call our office with any questions or concerns.

## 2022-08-28 NOTE — Telephone Encounter (Signed)
Pt c/o medication issue:  1. Name of Medication:  amLODipine (NORVASC) 5 MG tablet   2. How are you currently taking this medication (dosage and times per day)? 1 tablet daily  3. Are you having a reaction (difficulty breathing--STAT)?   4. What is your medication issue? Lettie from Dr. Erskine Speed office called (patient's PCP) stating patient was seen yesterday at our office and we needed clarification on her amlodipine.  They said patient takes a full 64m tablet.

## 2022-09-07 NOTE — Addendum Note (Signed)
Addended by: Gwendlyn Deutscher on: 09/07/2022 04:31 PM   Modules accepted: Orders

## 2022-09-24 ENCOUNTER — Encounter: Payer: Self-pay | Admitting: Radiology

## 2022-09-25 ENCOUNTER — Ambulatory Visit
Admission: RE | Admit: 2022-09-25 | Discharge: 2022-09-25 | Disposition: A | Discharge status: Home / Self Care | Payer: Medicare Other | Source: Ambulatory Visit | Attending: Internal Medicine | Admitting: Internal Medicine

## 2022-09-25 DIAGNOSIS — Z1231 Encounter for screening mammogram for malignant neoplasm of breast: Secondary | ICD-10-CM

## 2022-09-28 DIAGNOSIS — I700 Atherosclerosis of aorta: Secondary | ICD-10-CM | POA: Diagnosis not present

## 2022-09-28 DIAGNOSIS — R0982 Postnasal drip: Secondary | ICD-10-CM | POA: Diagnosis not present

## 2022-09-28 DIAGNOSIS — I10 Essential (primary) hypertension: Secondary | ICD-10-CM | POA: Diagnosis not present

## 2022-09-28 DIAGNOSIS — R5383 Other fatigue: Secondary | ICD-10-CM | POA: Diagnosis not present

## 2022-10-07 ENCOUNTER — Ambulatory Visit: Payer: Medicare Other | Attending: Internal Medicine | Admitting: Pharmacist

## 2022-10-07 VITALS — BP 122/70 | HR 62

## 2022-10-07 DIAGNOSIS — I10 Essential (primary) hypertension: Secondary | ICD-10-CM

## 2022-10-07 DIAGNOSIS — H35033 Hypertensive retinopathy, bilateral: Secondary | ICD-10-CM | POA: Diagnosis not present

## 2022-10-07 DIAGNOSIS — Z9849 Cataract extraction status, unspecified eye: Secondary | ICD-10-CM | POA: Diagnosis not present

## 2022-10-07 DIAGNOSIS — H43813 Vitreous degeneration, bilateral: Secondary | ICD-10-CM | POA: Diagnosis not present

## 2022-10-07 DIAGNOSIS — H43393 Other vitreous opacities, bilateral: Secondary | ICD-10-CM | POA: Diagnosis not present

## 2022-10-07 NOTE — Progress Notes (Signed)
Patient ID: Lindsey Barnett                 DOB: February 18, 1935                      MRN: [id number]     HPI: Lindsey Barnett is a 88 y.o. adult referred by Dr. Gasper Sells to HTN clinic. PMH is significant for PAD, HLD, TIA, HTN, paroxysmal SVT, and non-cardiac dizziness (extensive workup with no cardiac etiology, decrease in BP meds did not resolve). Was not feeling well at last appt with Dr Gasper Sells on 08/27/22. BP was elevated at 156/76 at that visit. Her losartan 100mg  was changed to irbesartan 300mg . Encouraged to follow up with PCP and ENT for non-cardiac dizziness. Also started on ezetimibe.  Pt presents today for follow up. Reports tolerating medications well. She saw her PCP on 09/28/22, BP was 134/74 at that visit and her amlodipine 2.5mg  daily was stopped secondary to pt reporting a few low BP readings at home - 102/65, another one with SBP in the 80s that she recalls. Notes that swelling in her ankles has improved since stopping amlodipine. She brings in a print out of her most recent PCP visit which is at [facility] - their med list is more comprehensive than ours - pt also taking Lasix as needed (once monthly on average, drains her energy when she takes), Celebrex prn (hasn't used this month, typically for knee pain), Flonase, albuterol, and famotidine.  Home BP readings since stopping amlodipine on 3/11 are as follows: 106/66, 126/68, 100/67, 127/76, 117/68, 142/97, 138/84. Pressure excellent in clinic today.  Still experiencing dizziness when she bends over working in her garden. Feels fine when she's standing/walking. Home BP lower in the AM when she gets up around [id number], [id number] systolic by lunch time when she takes her irbesartan.  Current HTN meds: irbesartan 300mg  daily, Toprol 12.5mg  daily Previously tried: amlodipine - mild LE edema BP goal: <140/61mmHg  Family History: Non contributory.  Social History: Occasional alcohol use, no drug or tobacco use.  Wt Readings from Last  3 Encounters:  08/27/22 153 lb (69.4 kg)  08/13/22 154 lb 5.2 oz (70 kg)  03/16/22 153 lb (69.4 kg)   BP Readings from Last 3 Encounters:  08/27/22 (!) 156/76  08/14/22 (!) 153/62  03/16/22 (!) 142/80   Pulse Readings from Last 3 Encounters:  08/27/22 (!) 57  08/14/22 68  03/16/22 66    Renal function: CrCl cannot be calculated (Patient's most recent lab result is older than the maximum 21 days allowed.).  Past Medical History:  Diagnosis Date   Acute meniscal tear, lateral RIGHT   Arthritis HIP   Cataract immature    GERD (gastroesophageal reflux disease) OCCASIONAL   History of TIA (transient ischemic attack) 2005--  NO RESIDUAL   Hyperlipidemia    Hypertension    MVP (mitral valve prolapse)    Peripheral vascular disease (HCC)    Stress incontinence    Swelling of right knee joint     Current Outpatient Medications on File Prior to Visit  Medication Sig Dispense Refill   acetaminophen (TYLENOL) 500 MG tablet Take 1,000 mg by mouth every 6 (six) hours as needed for mild pain.     amLODipine (NORVASC) 5 MG tablet Take 5 mg by mouth daily.     amoxicillin (AMOXIL) 500 MG capsule Take 2,000 mg by mouth daily as needed (1 hour prior to dental appointments).     Cholecalciferol 2000  units CAPS Take 2,000 Units by mouth daily.     clopidogrel (PLAVIX) 75 MG tablet Take 75 mg by mouth daily.     Coenzyme Q10 (COQ10) 200 MG CAPS Take 200 mg by mouth daily.      ezetimibe (ZETIA) 10 MG tablet Take 1 tablet (10 mg total) by mouth daily. 90 tablet 3   irbesartan (AVAPRO) 300 MG tablet Take 1 tablet (300 mg total) by mouth daily. 90 tablet 3   metoprolol succinate (TOPROL XL) 25 MG 24 hr tablet Take 0.5 tablets (12.5 mg total) by mouth daily. 45 tablet 3   Multiple Vitamin (MULTIVITAMIN) tablet Take 1 tablet by mouth at bedtime.      pantoprazole (PROTONIX) 40 MG tablet Take 1 tablet (40 mg total) by mouth daily.     rosuvastatin (CRESTOR) 10 MG tablet Take 10 mg by mouth 3  (three) times a week. MWF     No current facility-administered medications on file prior to visit.    Allergies  Allergen Reactions   Codeine Other (See Comments)    STOMACHE SPASMS   Contrast Media [Iodinated Contrast Media] Hives   Iodine Other (See Comments)    Hives?   Sodium Tetradecyl Sulfate Other (See Comments)    unknown     Assessment/Plan:  1. Hypertension - BP at goal <140/69mmHg (higher goal given age and dizziness when bending over) after PCP stopped low dose amlodipine recently. Will continue irbesartan 300mg  daily and Toprol 12.5mg  daily (for SVT). Pt will continue to monitor BP and call with any concerns. F/u with PharmD as needed.  [name] E. Kaelan Emami, PharmD, BCACP, [facility]. [address] [place], [place] [address] Phone: [phone]; Fax: [phone] 10/07/2022 2:13 PM

## 2022-10-07 NOTE — Patient Instructions (Addendum)
Your blood pressure is at goal is < 140/56mmHg   Continue taking your current medications  Important lifestyle changes to control high blood pressure  Intervention  Effect on the BP   Weight loss Weight loss is one of the most effective lifestyle changes for controlling blood pressure. If you're overweight or obese, losing even a small amount of weight can help reduce blood pressure.    Blood pressure can decrease by 1 millimeter of mercury (mmHg) with each kilogram (about 2.2 pounds) of weight lost.   Exercise regularly As a general goal, aim for 30 minutes of moderate physical activity every day.    Regular physical activity can lower blood pressure by 5 - 8 mmHg.   Eat a healthy diet Eat a diet rich in whole grains, fruits, vegetables, lean meat, and low-fat dairy products. Limit processed foods, saturated fat, and sweets.    A heart-healthy diet can lower high blood pressure by 10 mmHg.   Reduce salt (sodium) in your diet Aim for 000mg  of sodium each day. Avoid deli meats, canned food, and frozen microwave meals which are high in sodium.     Limiting sodium can reduce blood pressure by 5 mmHg.   Limit alcohol One drink equals 12 ounces of beer, 5 ounces of wine, or 1.5 ounces of 80-proof liquor.    Limiting alcohol to < 1 drink a day for women or < 2 drinks a day for men can help lower blood pressure by about 4 mmHg.   To check your pressure at home you will need to:   Sit up in a chair, with feet flat on the floor and back supported. Do not cross your ankles or legs. Rest your left arm so that the cuff is about heart level. If the cuff goes on your upper arm, then just relax your arm on the table, arm of the chair, or your lap. If you have a wrist cuff, hold your wrist against your chest at heart level. Place the cuff snugly around your arm, about 1 inch above the crease of your elbow. The cords should be inside the groove of your elbow.  Sit quietly, with the cuff in  place, for about 5 minutes. Then press the power button to start a reading. Do not talk or move while the reading is taking place.  Record your readings on a sheet of paper. Although most cuffs have a memory, it is often easier to see a pattern developing when the numbers are all in front of you.  You can repeat the reading after 1-3 minutes if it is recommended.   Make sure your bladder is empty and you have not had caffeine or tobacco within the last 30 minutes   Always bring your blood pressure log with you to your appointments. If you have not brought your monitor in to be double checked for accuracy, please bring it to your next appointment.   You can find a list of validated (accurate) blood pressure cuffs at: validatebp.org

## 2022-12-28 DIAGNOSIS — I700 Atherosclerosis of aorta: Secondary | ICD-10-CM | POA: Diagnosis not present

## 2022-12-28 DIAGNOSIS — I951 Orthostatic hypotension: Secondary | ICD-10-CM | POA: Diagnosis not present

## 2022-12-28 DIAGNOSIS — Z23 Encounter for immunization: Secondary | ICD-10-CM | POA: Diagnosis not present

## 2022-12-28 DIAGNOSIS — I10 Essential (primary) hypertension: Secondary | ICD-10-CM | POA: Diagnosis not present

## 2022-12-28 DIAGNOSIS — M1712 Unilateral primary osteoarthritis, left knee: Secondary | ICD-10-CM | POA: Diagnosis not present

## 2022-12-28 DIAGNOSIS — Z79899 Other long term (current) drug therapy: Secondary | ICD-10-CM | POA: Diagnosis not present

## 2022-12-31 DIAGNOSIS — M1712 Unilateral primary osteoarthritis, left knee: Secondary | ICD-10-CM | POA: Diagnosis not present

## 2022-12-31 DIAGNOSIS — M6281 Muscle weakness (generalized): Secondary | ICD-10-CM | POA: Diagnosis not present

## 2022-12-31 DIAGNOSIS — R262 Difficulty in walking, not elsewhere classified: Secondary | ICD-10-CM | POA: Diagnosis not present

## 2023-01-13 DIAGNOSIS — M1712 Unilateral primary osteoarthritis, left knee: Secondary | ICD-10-CM | POA: Diagnosis not present

## 2023-01-13 DIAGNOSIS — R262 Difficulty in walking, not elsewhere classified: Secondary | ICD-10-CM | POA: Diagnosis not present

## 2023-01-13 DIAGNOSIS — M6281 Muscle weakness (generalized): Secondary | ICD-10-CM | POA: Diagnosis not present

## 2023-01-15 DIAGNOSIS — L821 Other seborrheic keratosis: Secondary | ICD-10-CM | POA: Diagnosis not present

## 2023-01-15 DIAGNOSIS — D225 Melanocytic nevi of trunk: Secondary | ICD-10-CM | POA: Diagnosis not present

## 2023-01-15 DIAGNOSIS — L578 Other skin changes due to chronic exposure to nonionizing radiation: Secondary | ICD-10-CM | POA: Diagnosis not present

## 2023-01-15 DIAGNOSIS — C44519 Basal cell carcinoma of skin of other part of trunk: Secondary | ICD-10-CM | POA: Diagnosis not present

## 2023-01-15 DIAGNOSIS — L570 Actinic keratosis: Secondary | ICD-10-CM | POA: Diagnosis not present

## 2023-01-15 DIAGNOSIS — L820 Inflamed seborrheic keratosis: Secondary | ICD-10-CM | POA: Diagnosis not present

## 2023-01-15 DIAGNOSIS — D485 Neoplasm of uncertain behavior of skin: Secondary | ICD-10-CM | POA: Diagnosis not present

## 2023-01-19 DIAGNOSIS — M6281 Muscle weakness (generalized): Secondary | ICD-10-CM | POA: Diagnosis not present

## 2023-01-19 DIAGNOSIS — R262 Difficulty in walking, not elsewhere classified: Secondary | ICD-10-CM | POA: Diagnosis not present

## 2023-01-19 DIAGNOSIS — M1712 Unilateral primary osteoarthritis, left knee: Secondary | ICD-10-CM | POA: Diagnosis not present

## 2023-01-22 DIAGNOSIS — R262 Difficulty in walking, not elsewhere classified: Secondary | ICD-10-CM | POA: Diagnosis not present

## 2023-01-22 DIAGNOSIS — M6281 Muscle weakness (generalized): Secondary | ICD-10-CM | POA: Diagnosis not present

## 2023-01-22 DIAGNOSIS — M1712 Unilateral primary osteoarthritis, left knee: Secondary | ICD-10-CM | POA: Diagnosis not present

## 2023-01-26 DIAGNOSIS — M6281 Muscle weakness (generalized): Secondary | ICD-10-CM | POA: Diagnosis not present

## 2023-01-26 DIAGNOSIS — R262 Difficulty in walking, not elsewhere classified: Secondary | ICD-10-CM | POA: Diagnosis not present

## 2023-01-26 DIAGNOSIS — M1712 Unilateral primary osteoarthritis, left knee: Secondary | ICD-10-CM | POA: Diagnosis not present

## 2023-01-29 DIAGNOSIS — M6281 Muscle weakness (generalized): Secondary | ICD-10-CM | POA: Diagnosis not present

## 2023-01-29 DIAGNOSIS — R262 Difficulty in walking, not elsewhere classified: Secondary | ICD-10-CM | POA: Diagnosis not present

## 2023-01-29 DIAGNOSIS — M1712 Unilateral primary osteoarthritis, left knee: Secondary | ICD-10-CM | POA: Diagnosis not present

## 2023-02-01 DIAGNOSIS — S51012A Laceration without foreign body of left elbow, initial encounter: Secondary | ICD-10-CM | POA: Diagnosis not present

## 2023-02-01 DIAGNOSIS — W19XXXA Unspecified fall, initial encounter: Secondary | ICD-10-CM | POA: Diagnosis not present

## 2023-02-01 DIAGNOSIS — Y92009 Unspecified place in unspecified non-institutional (private) residence as the place of occurrence of the external cause: Secondary | ICD-10-CM | POA: Diagnosis not present

## 2023-02-01 DIAGNOSIS — S8992XA Unspecified injury of left lower leg, initial encounter: Secondary | ICD-10-CM | POA: Diagnosis not present

## 2023-02-04 ENCOUNTER — Telehealth: Payer: Self-pay | Admitting: Orthopaedic Surgery

## 2023-02-04 ENCOUNTER — Ambulatory Visit (HOSPITAL_BASED_OUTPATIENT_CLINIC_OR_DEPARTMENT_OTHER): Payer: Medicare Other | Admitting: Physician Assistant

## 2023-02-04 ENCOUNTER — Emergency Department (HOSPITAL_BASED_OUTPATIENT_CLINIC_OR_DEPARTMENT_OTHER)
Admission: EM | Admit: 2023-02-04 | Discharge: 2023-02-04 | Disposition: A | Discharge status: Home / Self Care | Payer: Medicare Other | Attending: Emergency Medicine | Admitting: Emergency Medicine

## 2023-02-04 ENCOUNTER — Encounter (HOSPITAL_BASED_OUTPATIENT_CLINIC_OR_DEPARTMENT_OTHER): Payer: Self-pay

## 2023-02-04 ENCOUNTER — Encounter (HOSPITAL_BASED_OUTPATIENT_CLINIC_OR_DEPARTMENT_OTHER): Payer: Self-pay | Admitting: Physician Assistant

## 2023-02-04 ENCOUNTER — Other Ambulatory Visit: Payer: Self-pay

## 2023-02-04 DIAGNOSIS — M25562 Pain in left knee: Secondary | ICD-10-CM | POA: Insufficient documentation

## 2023-02-04 DIAGNOSIS — Z5321 Procedure and treatment not carried out due to patient leaving prior to being seen by health care provider: Secondary | ICD-10-CM | POA: Diagnosis not present

## 2023-02-04 DIAGNOSIS — G8929 Other chronic pain: Secondary | ICD-10-CM

## 2023-02-04 DIAGNOSIS — M25561 Pain in right knee: Secondary | ICD-10-CM | POA: Insufficient documentation

## 2023-02-04 DIAGNOSIS — W108XXA Fall (on) (from) other stairs and steps, initial encounter: Secondary | ICD-10-CM | POA: Insufficient documentation

## 2023-02-04 NOTE — Progress Notes (Signed)
Office Visit Note   Patient: Lindsey Barnett           Date of Birth: 02-Feb-1935           MRN: [id number] Visit Date: 02/04/2023              Requested by: Thana Ates, MD [address] [place],  [place] [address] PCP: Thana Ates, MD  No chief complaint on file.     HPI: Luanna is a pleasant 88 year old woman who is a patient of Dr. Magnus Ivan.  She has a history of advanced osteoarthritis of both of her knees.  Last summer she presented to him and underwent steroid injections into her knees which were not particularly helpful at all she was then authorized for viscosupplementation.  Unfortunately she did have a reaction to the medication and had swelling in the leg.  She comes in to my clinic today because of a fall down onto her left knee a few days ago.  She did present to the ER where no fracture was identified.  What she complains of mostly is that when she is sitting for a while to stand up is extremely difficult and painful for her.  She denies any fever or chills.  She is no worse than a few days ago  Assessment & Plan: Visit Diagnoses: Osteoarthritis bilateral lateral knees  Plan: She is accompanied by her husband and her son.  I do not see anything compelling that would support that she had a fracture.  She has no ecchymosis she does have a mild effusion on the right knee which was not injured but she admits that she has been using the right knee more because of the left knee.  She does have a knee sleeve that she is wearing on the left.  We did not go forward with a steroid injection but this has not helped her in the past.  Because this is only been a few days I recommended symptomatic treatment with Voltaren gel ice and Tylenol.  She would like to see Dr. Magnus Ivan.  I told her that I would like to have someone evaluate her in 2 weeks either myself or Kriste Basque which it should also make a follow-up with Dr. Magnus Ivan because she feels like her knee pain is just  got more progressive  Follow-Up Instructions: 2 weeks  Ortho Exam  Patient is alert, oriented, no adenopathy, well-dressed, normal affect, normal respiratory effort. Left knee she has a mild bruising which does not appear acute.  No tenderness over the medial lateral joint line no effusion compartments are soft and compressible negative Denna Haggard' sign she has quite a bit of grinding with range of motion.  Right knee she has a mild effusion but no redness no erythema compartments are soft and compressible she is neurovascular intact bilaterally in the lower extremities no tenderness over the tibial plateaus of either knee no tenderness over the patella  Imaging: No results found. No images are attached to the encounter.  Labs: No results found for: "HGBA1C", "ESRSEDRATE", "CRP", "LABURIC", "REPTSTATUS", "GRAMSTAIN", "CULT", "LABORGA"   Lab Results  Component Value Date   ALBUMIN 3.5 09/22/2015    No results found for: "MG" No results found for: "VD25OH"  No results found for: "PREALBUMIN"    Latest Ref Rng & Units 08/13/2022   12:16 PM 06/22/2019    3:26 PM 03/07/2016    4:41 AM  CBC EXTENDED  WBC 4.0 - 10.5 K/uL  7.8  7.6  7.2   RBC 3.87 - 5.11 MIL/uL 4.76  4.58  4.00   Hemoglobin 12.0 - 15.0 g/dL 53.6  64.4  03.4   HCT 36.0 - 46.0 % 42.9  42.0  34.8   Platelets 150 - 400 K/uL 302  247  227      There is no height or weight on file to calculate BMI.  Orders:  No orders of the defined types were placed in this encounter.  No orders of the defined types were placed in this encounter.    Procedures: No procedures performed  Clinical Data: No additional findings.  ROS:  All other systems negative, except as noted in the HPI. Review of Systems  Objective: Vital Signs: There were no vitals taken for this visit.  Specialty Comments:  No specialty comments available.  PMFS History: Patient Active Problem List   Diagnosis Date Noted   Dizziness 09/12/2021    Overweight (BMI 25.0-29.9) 09/12/2021   Chest pain 06/22/2019   First degree AV block 06/22/2019   Uncontrolled hypertension 06/22/2019   HLD (hyperlipidemia) 06/22/2019   PAD (peripheral artery disease) (HCC) 06/22/2019   Impingement syndrome of right shoulder 12/22/2017   Sciatica, left side 09/08/2017   Pain due to right hip joint prosthesis (HCC) 03/06/2016   Status post total replacement of right hip 03/06/2016   Hip fracture requiring operative repair (HCC) 09/22/2015   Hip injury    Subcapital fracture of hip (HCC) 09/21/2015   Hypokalemia 09/21/2015   HTN (hypertension) 09/21/2015   GERD (gastroesophageal reflux disease) 09/21/2015   Lateral meniscal tear 09/16/2011   Past Medical History:  Diagnosis Date   Acute meniscal tear, lateral RIGHT   Arthritis HIP   Cataract immature    GERD (gastroesophageal reflux disease) OCCASIONAL   History of TIA (transient ischemic attack) 2005--  NO RESIDUAL   Hyperlipidemia    Hypertension    MVP (mitral valve prolapse)    Peripheral vascular disease (HCC)    Stress incontinence    Swelling of right knee joint     Family History  Problem Relation Age of Onset   Breast cancer Neg Hx     Past Surgical History:  Procedure Laterality Date   ANTERIOR APPROACH HEMI HIP ARTHROPLASTY Right 09/22/2015   Procedure: ANTERIOR APPROACH HEMI HIP ARTHROPLASTY;  Surgeon: Jodi Geralds, MD;  Location: [facility];  Service: Orthopedics;  Laterality: Right;   BREAST CYST EXCISION Right    CHOLECYSTECTOMY  1975   KNEE ARTHROSCOPY  09/16/2011   Procedure: ARTHROSCOPY KNEE;  Surgeon: Loanne Drilling, MD;  Location: [facility];  Service: Orthopedics;  Laterality: Right;  WITH lateral meniscal debridement   TOTAL HIP ARTHROPLASTY Right 03/06/2016   Procedure: CONVERSION RIGHT HIP HEMIARTHROPLASTY TO RIGHT TOTAL HIP ARTHROPLASTY ANTERIOR APPROACH;  Surgeon: Kathryne Hitch, MD;  Location: [facility];  Service: Orthopedics;  Laterality: Right;    VAGINAL HYSTERECTOMY  1973   Social History   Occupational History   Not on file  Tobacco Use   Smoking status: Never   Smokeless tobacco: Never  Substance and Sexual Activity   Alcohol use: Not Currently    Comment: OCCASIONAL   Drug use: No   Sexual activity: Not on file

## 2023-02-04 NOTE — ED Triage Notes (Addendum)
Patient here POV from Home.  Endorses fall Monday. States she fell down approximately 5 Steps. States she didn't use the railing and lost her balance and fell down. Was seen for same at an UC and had negative imaging of left knee. Seeks evaluation for continued discomfort.   No Head Injury or LOC. Takes Plavix. Pain to Bilateral knees.   NAD Noted during triage. A&Ox4. GCS 15. BIB Wheelchair.

## 2023-02-04 NOTE — Telephone Encounter (Signed)
Called and scheduled pt to see maryann this afternoon at the [facility] : [facility] office

## 2023-02-04 NOTE — Telephone Encounter (Signed)
Patient called. Says she fell and needs to be worked in. Her cb# [phone]

## 2023-02-17 DIAGNOSIS — C44519 Basal cell carcinoma of skin of other part of trunk: Secondary | ICD-10-CM | POA: Diagnosis not present

## 2023-02-18 ENCOUNTER — Other Ambulatory Visit (INDEPENDENT_AMBULATORY_CARE_PROVIDER_SITE_OTHER): Payer: Medicare Other

## 2023-02-18 ENCOUNTER — Ambulatory Visit: Payer: Medicare Other | Admitting: Physician Assistant

## 2023-02-18 ENCOUNTER — Encounter: Payer: Self-pay | Admitting: Physician Assistant

## 2023-02-18 DIAGNOSIS — M25562 Pain in left knee: Secondary | ICD-10-CM

## 2023-02-18 DIAGNOSIS — G8929 Other chronic pain: Secondary | ICD-10-CM

## 2023-02-18 DIAGNOSIS — M25561 Pain in right knee: Secondary | ICD-10-CM

## 2023-02-18 MED ORDER — METHYLPREDNISOLONE ACETATE 40 MG/ML IJ SUSP
40.0000 mg | INTRAMUSCULAR | Status: AC | PRN
  Administered 2023-02-18: 40 mg via INTRA_ARTICULAR

## 2023-02-18 MED ORDER — LIDOCAINE HCL 1 % IJ SOLN
3.0000 mL | INTRAMUSCULAR | Status: AC | PRN
  Administered 2023-02-18: 3 mL

## 2023-02-18 NOTE — Progress Notes (Signed)
Office Visit Note   Patient: Lindsey Barnett           Date of Birth: [birth year]           MRN: [id number] Visit Date: 02/18/2023              Requested by: Thana Ates, MD [address] [place],  [place] [address] PCP: Thana Ates, MD  Chief Complaint  Patient presents with  . Left Knee - Pain, Follow-up  . Right Knee - Pain      HPI: Lindsey Barnett is a pleasant 88 year old woman who is a patient of Dr. Eliberto Ivory.  I last saw her 2 weeks ago at our walk-in clinic at [facility] as she is sustained a fall onto her left knee.  She comes in today for follow-up.  She is concerned because she still has significant pain in her left knee.  She has been able to walk a little bit more but has to use a cane which is not her norm.  She has also complaining of increased pain in her right knee.  She thinks this may be because of the reliance and not being able to place much weight on her left.  She denies any fever chills or falls since I last saw her.  She is very concerned that something more is wrong with her left knee  Assessment & Plan: Visit Diagnoses:  1. Chronic pain of left knee   2. Chronic pain of right knee     Plan: X-rays were taken today.  I compared them with a year ago do not see any changes.  However because of her concerns she has inquired about getting a CAT scan I think this is fine as she has had continued pain and has a history of osteoporosis to rule out an occult fracture.  I will go forward and inject her right knee with steroid today I think she has had an exacerbation of her arthritis because she has been relying on this right knee.  X-rays today of the right knee show some progression of her arthritis especially on the lateral compartment.  I encouraged her to make an appointment as she is wondering about knee replacement.  I encouraged her to make an appointment after her CT scan to discuss things with Dr. Magnus Ivan.  Certainly if the CT did not show a  fracture she could go forward and have her left knee injected  Follow-Up Instructions: Return in about 2 weeks (around 03/04/2023).   Ortho Exam  Patient is alert, oriented, no adenopathy, well-dressed, normal affect, normal respiratory effort. Bilateral knees no effusion no erythema compartments are soft and compressible she does have some chronic venous stasis disease of her lower extremities but no evidence of cellulitis.  She has good flexion and extension actively though it is painful.  Valgus alignment especially on her right knee  Imaging: No results found. No images are attached to the encounter.  Labs: No results found for: "HGBA1C", "ESRSEDRATE", "CRP", "LABURIC", "REPTSTATUS", "GRAMSTAIN", "CULT", "LABORGA"   Lab Results  Component Value Date   ALBUMIN 3.5 09/22/2015    No results found for: "MG" No results found for: "VD25OH"  No results found for: "PREALBUMIN"    Latest Ref Rng & Units 08/13/2022   12:16 PM 06/22/2019    3:26 PM 03/07/2016    4:41 AM  CBC EXTENDED  WBC 4.0 - 10.5 K/uL 7.8  7.6  7.2   RBC 3.87 -  5.11 MIL/uL 4.76  4.58  4.00   Hemoglobin 12.0 - 15.0 g/dL 11.9  14.7  82.9   HCT 36.0 - 46.0 % 42.9  42.0  34.8   Platelets 150 - 400 K/uL 302  247  227      There is no height or weight on file to calculate BMI.  Orders:  Orders Placed This Encounter  Procedures  . XR KNEE 3 VIEW LEFT  . XR Knee 1-2 Views Right  . CT KNEE LEFT WO CONTRAST   No orders of the defined types were placed in this encounter.    Procedures: Large Joint Inj: R knee on 02/18/2023 11:30 AM Indications: pain and diagnostic evaluation Details: 25 G 1.5 in needle, anteromedial approach  Arthrogram: No  Medications: 40 mg methylPREDNISolone acetate 40 MG/ML; 3 mL lidocaine 1 % Outcome: tolerated well, no immediate complications Procedure, treatment alternatives, risks and benefits explained, specific risks discussed. Consent was given by the patient.    Clinical  Data: No additional findings.  ROS:  All other systems negative, except as noted in the HPI. Review of Systems  Objective: Vital Signs: There were no vitals taken for this visit.  Specialty Comments:  No specialty comments available.  PMFS History: Patient Active Problem List   Diagnosis Date Noted  . Dizziness 09/12/2021  . Overweight (BMI 25.0-29.9) 09/12/2021  . Chest pain 06/22/2019  . First degree AV block 06/22/2019  . Uncontrolled hypertension 06/22/2019  . HLD (hyperlipidemia) 06/22/2019  . PAD (peripheral artery disease) (HCC) 06/22/2019  . Impingement syndrome of right shoulder 12/22/2017  . Sciatica, left side 09/08/2017  . Pain due to right hip joint prosthesis (HCC) 03/06/2016  . 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 Hip fracture requiring operative repair (HCC) 09/22/2015  . Hip injury   . Subcapital fracture of hip (HCC) 09/21/2015  . Hypokalemia 09/21/2015  . HTN (hypertension) 09/21/2015  . GERD (gastroesophageal reflux disease) 09/21/2015  . Lateral meniscal tear 09/16/2011   Past Medical History:  Diagnosis Date  . Acute meniscal tear, lateral RIGHT  . Arthritis HIP  . Cataract immature   . GERD (gastroesophageal reflux disease) OCCASIONAL  . History of TIA (transient ischemic attack) 2005--  NO RESIDUAL  . Hyperlipidemia   . Hypertension   . MVP (mitral valve prolapse)   . Peripheral vascular disease (HCC)   . Stress incontinence   . Swelling of right knee joint     Family History  Problem Relation Age of Onset  . Breast cancer Neg Hx     Past Surgical History:  Procedure Laterality Date  . ANTERIOR APPROACH HEMI HIP ARTHROPLASTY Right 09/22/2015   Procedure: ANTERIOR APPROACH HEMI HIP ARTHROPLASTY;  Surgeon: Jodi Geralds, MD;  Location: [facility];  Service: Orthopedics;  Laterality: Right;  . BREAST CYST EXCISION Right   . CHOLECYSTECTOMY  1975  . KNEE ARTHROSCOPY  09/16/2011   Procedure: ARTHROSCOPY KNEE;  Surgeon: Loanne Drilling, MD;  Location: [facility];  Service: Orthopedics;  Laterality: Right;  WITH lateral meniscal debridement  . TOTAL HIP ARTHROPLASTY Right 03/06/2016   Procedure: CONVERSION RIGHT HIP HEMIARTHROPLASTY TO RIGHT TOTAL HIP ARTHROPLASTY ANTERIOR APPROACH;  Surgeon: Kathryne Hitch, MD;  Location: [facility];  Service: Orthopedics;  Laterality: Right;  Marland Kitchen VAGINAL HYSTERECTOMY  1973   Social History   Occupational History  . Not on file  Tobacco Use  . Smoking status: Never  . Smokeless tobacco: Never  Substance and Sexual  Activity  . Alcohol use: Not Currently    Comment: OCCASIONAL  . Drug use: No  . Sexual activity: Not on file

## 2023-02-19 ENCOUNTER — Ambulatory Visit: Payer: Medicare Other | Admitting: Internal Medicine

## 2023-02-19 ENCOUNTER — Encounter: Payer: Self-pay | Admitting: Internal Medicine

## 2023-02-19 VITALS — BP 122/70 | HR 64 | Ht 67.0 in | Wt 155.0 lb

## 2023-02-19 DIAGNOSIS — M791 Myalgia, unspecified site: Secondary | ICD-10-CM | POA: Insufficient documentation

## 2023-02-19 DIAGNOSIS — I440 Atrioventricular block, first degree: Secondary | ICD-10-CM | POA: Diagnosis not present

## 2023-02-19 DIAGNOSIS — I471 Supraventricular tachycardia, unspecified: Secondary | ICD-10-CM

## 2023-02-19 DIAGNOSIS — T466X5A Adverse effect of antihyperlipidemic and antiarteriosclerotic drugs, initial encounter: Secondary | ICD-10-CM

## 2023-02-19 DIAGNOSIS — E782 Mixed hyperlipidemia: Secondary | ICD-10-CM

## 2023-02-19 DIAGNOSIS — I10 Essential (primary) hypertension: Secondary | ICD-10-CM

## 2023-02-19 DIAGNOSIS — I739 Peripheral vascular disease, unspecified: Secondary | ICD-10-CM

## 2023-02-19 NOTE — Progress Notes (Signed)
Cardiology Office Note:    Date:  02/19/2023   ID:  Lindsey Barnett [place], DOB 05-Sep-1934, MRN [id number]  PCP:  Thana Ates, MD   [facility] Providers Cardiologist:  Christell Constant, MD     Referring MD: Thana Ates, MD   CC:  BP follow up  History of Present Illness:    Lindsey Barnett is a 88 y.o. adult with a hx of Non cardiac dizziness (extensive work up with no cardiac etiology, decrease in BP meds did not resolve this), PAD (w/o claudication on ASA and statin, HLD and prior TIA, HTN, and P-SVT. 2024: CP control improved of CCB and on Avapro.  Was at goal at [facility] visit.  Patient notes that he is doing well except  Since last visit notes that since starting irbesartan BP has resolved . Had a fall 7/18 with no breaks.  Is getting her her strength up.  No chest pain or pressure .  No SOB/DOE and no PND/Orthopnea.  No weight gain or leg swelling.  No palpitations or syncope .  Past Medical History:  Diagnosis Date   Acute meniscal tear, lateral RIGHT   Arthritis HIP   Cataract immature    GERD (gastroesophageal reflux disease) OCCASIONAL   History of TIA (transient ischemic attack) 2005--  NO RESIDUAL   Hyperlipidemia    Hypertension    MVP (mitral valve prolapse)    Peripheral vascular disease (HCC)    Stress incontinence    Swelling of right knee joint     Past Surgical History:  Procedure Laterality Date   ANTERIOR APPROACH HEMI HIP ARTHROPLASTY Right 09/22/2015   Procedure: ANTERIOR APPROACH HEMI HIP ARTHROPLASTY;  Surgeon: Jodi Geralds, MD;  Location: [facility] OR;  Service: Orthopedics;  Laterality: Right;   BREAST CYST EXCISION Right    CHOLECYSTECTOMY  1975   KNEE ARTHROSCOPY  09/16/2011   Procedure: ARTHROSCOPY KNEE;  Surgeon: Loanne Drilling, MD;  Location: [facility];  Service: Orthopedics;  Laterality: Right;  WITH lateral meniscal debridement   TOTAL HIP ARTHROPLASTY Right 03/06/2016   Procedure: CONVERSION RIGHT HIP  HEMIARTHROPLASTY TO RIGHT TOTAL HIP ARTHROPLASTY ANTERIOR APPROACH;  Surgeon: Kathryne Hitch, MD;  Location: [facility];  Service: Orthopedics;  Laterality: Right;   VAGINAL HYSTERECTOMY  1973    Current Medications: Current Meds  Medication Sig   acetaminophen (TYLENOL) 500 MG tablet Take 1,000 mg by mouth every 6 (six) hours as needed for mild pain.   albuterol (VENTOLIN HFA) 108 (90 Base) MCG/ACT inhaler Inhale 1 puff into the lungs every 6 (six) hours as needed for wheezing or shortness of breath.   amoxicillin (AMOXIL) 500 MG capsule Take 2,000 mg by mouth daily as needed (1 hour prior to dental appointments).   celecoxib (CELEBREX) 100 MG capsule Take 100 mg by mouth as needed.   Cholecalciferol 125 MCG (5000 UT) TABS Take 5,000 Units by mouth daily.   clopidogrel (PLAVIX) 75 MG tablet Take 75 mg by mouth daily.   Coenzyme Q10 (COQ10) 200 MG CAPS Take 200 mg by mouth daily.    cyanocobalamin (VITAMIN B12) 1000 MCG tablet Take 1,000 mcg by mouth daily.   ezetimibe (ZETIA) 10 MG tablet Take 1 tablet (10 mg total) by mouth daily.   famotidine (PEPCID) 20 MG tablet Take 20 mg by mouth daily.   fluticasone (FLONASE) 50 MCG/ACT nasal spray Place 1 spray into both nostrils daily.   furosemide (LASIX) 20 MG tablet Take 20 mg  by mouth as needed. Uses about once a month   gabapentin (NEURONTIN) 100 MG capsule Take 100 mg by mouth at bedtime. Takes 1-3 capsules at bedtime   irbesartan (AVAPRO) 300 MG tablet Take 1 tablet (300 mg total) by mouth daily.   metoprolol succinate (TOPROL XL) 25 MG 24 hr tablet Take 0.5 tablets (12.5 mg total) by mouth daily.   Multiple Vitamin (MULTIVITAMIN) tablet Take 1 tablet by mouth at bedtime.    pantoprazole (PROTONIX) 40 MG tablet Take 1 tablet (40 mg total) by mouth daily. (Patient taking differently: Take 40 mg by mouth as needed (heartburn).)   rosuvastatin (CRESTOR) 10 MG tablet Take 10 mg by mouth 3 (three) times a week. MWF   traMADol (ULTRAM) 50 MG  tablet Take 50 mg by mouth as needed.     Allergies:   Codeine, Contrast media [iodinated contrast media], Iodine, and Sodium tetradecyl sulfate   Social History   Socioeconomic History   Marital status: Married    Spouse name: Not on file   Number of children: Not on file   Years of education: Not on file   Highest education level: Not on file  Occupational History   Not on file  Tobacco Use   Smoking status: Never   Smokeless tobacco: Never  Substance and Sexual Activity   Alcohol use: Not Currently    Comment: OCCASIONAL   Drug use: No   Sexual activity: Not on file  Other Topics Concern   Not on file  Social History Narrative   Not on file   Social Determinants of Health   Financial Resource Strain: Not on file  Food Insecurity: Not on file  Transportation Needs: Not on file  Physical Activity: Not on file  Stress: Not on file  Social Connections: Not on file    Family History: The patient's family history is negative for Breast cancer.  ROS:   Please see the history of present illness.     EKGs/Labs/Other Studies Reviewed:    The following studies were reviewed today: EKG:   08/27/2022: Sinus bradycardia 1st AV block  Cardiac Studies & Procedures     STRESS TESTS  MYOCARDIAL PERFUSION IMAGING 05/15/2021  Narrative   Findings are consistent with no ischemia. The study is low risk.   No ST deviation was noted.   LV perfusion is normal. There is no evidence of ischemia. There is no evidence of infarction.   Left ventricular function is normal. Nuclear stress EF: 64 %. The left ventricular ejection fraction is normal (55-65%). End diastolic cavity size is normal. End systolic cavity size is normal.   Prior study available for comparison from 10/17/2003. Prior images unable to be viewed, noted EF 75%, small reversible area at the inferior apex  Normal LVEF and wall motion. Small apical inferior/inferolateral perfusion defect at rest that improves with  stress, not consistent with ischemia.   ECHOCARDIOGRAM  ECHOCARDIOGRAM COMPLETE 06/23/2019  Narrative ECHOCARDIOGRAM REPORT    Patient Name:   Lindsey Barnett Date of Exam: 06/23/2019 Medical Rec #:  [id number]          Height:       64.0 in Accession #:    [id number]         Weight:       158.5 lb Date of Birth:  1935-06-01          BSA:          1.77 m Patient Age:    74 years  BP:           148/69 mmHg Patient Gender: F                  HR:           55 bpm. Exam Location:  Inpatient  Procedure: 2D Echo  Indications:    chest pain 786.50  History:        Patient has prior history of Echocardiogram examinations, most recent 10/16/2003. Arrythmias:first degree av block, Signs/Symptoms:Chest Pain; Risk Factors:Dyslipidemia and Hypertension.  Sonographer:    Delcie Roch Referring Phys: [id number] VASUNDHRA RATHORE  IMPRESSIONS   1. Left ventricular ejection fraction, by visual estimation, is 60 to 65%. The left ventricle has normal function. There is no left ventricular hypertrophy. 2. Global right ventricle has normal systolic function.The right ventricular size is normal. No increase in right ventricular wall thickness. 3. Left atrial size was normal. 4. Right atrial size was normal. 5. The mitral valve is normal in structure. No evidence of mitral valve regurgitation. No evidence of mitral stenosis. 6. The tricuspid valve is normal in structure. Tricuspid valve regurgitation is not demonstrated. 7. The aortic valve is normal in structure. Aortic valve regurgitation is not visualized. No evidence of aortic valve sclerosis or stenosis. 8. The pulmonic valve was normal in structure. Pulmonic valve regurgitation is trivial. 9. Normal pulmonary artery systolic pressure. 10. The atrial septum is grossly normal.  FINDINGS Left Ventricle: Left ventricular ejection fraction, by visual estimation, is 60 to 65%. The left ventricle has normal function. There is no left  ventricular hypertrophy.  Right Ventricle: The right ventricular size is normal. No increase in right ventricular wall thickness. Global RV systolic function is has normal systolic function. The tricuspid regurgitant velocity is 2.58 m/s, and with an assumed right atrial pressure of 3 mmHg, the estimated right ventricular systolic pressure is normal at 29.6 mmHg.  Left Atrium: Left atrial size was normal in size.  Right Atrium: Right atrial size was normal in size  Pericardium: There is no evidence of pericardial effusion.  Mitral Valve: The mitral valve is normal in structure. No evidence of mitral valve stenosis by observation. No evidence of mitral valve regurgitation.  Tricuspid Valve: The tricuspid valve is normal in structure. Tricuspid valve regurgitation is not demonstrated.  Aortic Valve: The aortic valve is normal in structure. Aortic valve regurgitation is not visualized. The aortic valve is structurally normal, with no evidence of sclerosis or stenosis.  Pulmonic Valve: The pulmonic valve was normal in structure. Pulmonic valve regurgitation is trivial.  Aorta: The aortic root and ascending aorta are structurally normal, with no evidence of dilitation.  IAS/Shunts: The atrial septum is grossly normal.   LEFT VENTRICLE PLAX 2D LVIDd:         4.50 cm  Diastology LVIDs:         3.00 cm  LV e' lateral:   5.98 cm/s LV PW:         0.90 cm  LV E/e' lateral: 12.5 LV IVS:        0.80 cm  LV e' medial:    6.09 cm/s LVOT diam:     1.60 cm  LV E/e' medial:  12.3 LV SV:         57 ml LV SV Index:   31.57 LVOT Area:     2.01 cm   RIGHT VENTRICLE RV S prime:     12.60 cm/s TAPSE (M-mode): 2.0 cm  LEFT ATRIUM  Index       RIGHT ATRIUM           Index LA diam:        3.30 cm 1.86 cm/m  RA Area:     10.40 cm LA Vol (A2C):   33.7 ml 19.02 ml/m RA Volume:   19.80 ml  11.17 ml/m LA Vol (A4C):   32.0 ml 18.06 ml/m LA Biplane Vol: 33.7 ml 19.02 ml/m AORTIC  VALVE LVOT Vmax:   97.30 cm/s LVOT Vmean:  69.400 cm/s LVOT VTI:    0.237 m  AORTA Ao Root diam: 2.60 cm  MITRAL VALVE                        TRICUSPID VALVE MV Area (PHT): 2.42 cm             TR Peak grad:   26.6 mmHg MV PHT:        90.77 msec           TR Vmax:        258.00 cm/s MV Decel Time: 313 msec MV E velocity: 75.00 cm/s 103 cm/s  SHUNTS MV A velocity: 85.70 cm/s 70.3 cm/s Systemic VTI:  0.24 m MV E/A ratio:  0.88       1.5       Systemic Diam: 1.60 cm   Kristeen Miss MD Electronically signed by Kristeen Miss MD Signature Date/Time: 06/23/2019/4:00:12 PM    Final    MONITORS  LONG TERM MONITOR (3-14 DAYS) 06/04/2021  Narrative Patch Wear Time:  13 days and 19 hours (2022-10-27T15:31:10-398 to 2022-11-10T09:36:27-0500)  Patient had a min HR of 53 bpm, max HR of 197 bpm, and avg HR of 77 bpm. Predominant underlying rhythm was Sinus Rhythm. First Degree AV Block was present. 60 Supraventricular Tachycardia runs occurred, the run with the fastest interval lasting 18 beats with a max rate of 197 bpm, the longest lasting 20 beats with an avg rate of 113 bpm. Isolated SVEs were rare (<1.0%), SVE Couplets were rare (<1.0%), and SVE Triplets were rare (<1.0%). Isolated VEs were rare (<1.0%), VE Couplets were rare (<1.0%), and no VE Triplets were present.   Conclusion: This study is remarkable for paroxysmal supraventricular tachycardia which is likely atrial tachycardia.             Recent Labs: 08/13/2022: B Natriuretic Peptide 142.5; BUN 17; Creatinine, Ser 0.77; Hemoglobin 13.4; Platelets 302; Potassium 3.7; Sodium 140  Recent Lipid Panel    Component Value Date/Time   CHOL 213 (H) 06/23/2019 0426   TRIG 88 06/23/2019 0426   HDL 67 06/23/2019 0426   CHOLHDL 3.2 06/23/2019 0426   VLDL 18 06/23/2019 0426   LDLCALC 128 (H) 06/23/2019 0426     Physical Exam:    VS:  BP 122/70   Pulse 64   Ht 5\' 7"  (1.702 m)   Wt 155 lb (70.3 kg)   SpO2 96%   BMI 24.28  kg/m     Wt Readings from Last 3 Encounters:  02/19/23 155 lb (70.3 kg)  08/27/22 153 lb (69.4 kg)  08/13/22 154 lb 5.2 oz (70 kg)    GEN:  Well nourished, well developed in no acute distress HEENT: Normal NECK: No JVD CARDIAC: RRR, no murmurs, rubs, gallops RESPIRATORY:  Clear to auscultation without rales, wheezing or rhonchi  ABDOMEN: Soft, non-tender, non-distended MUSCULOSKELETAL:  Minimal non pitting edema; No deformity  SKIN: Warm and dry NEUROLOGIC:  Alert and  oriented x 3 PSYCHIATRIC:  Normal affect   ASSESSMENT:    1. Primary hypertension   2. First degree AV block   3. PAD (peripheral artery disease) (HCC)   4. Mixed hyperlipidemia   5. Paroxysmal SVT (supraventricular tachycardia)   6. Myalgia due to statin     PLAN:     HTN - improved on Irbesartan   PAD HLD Statin myopathy - continue highest tolerate statin and zetia 10 mg; offered PCSK9i in discussion, will defer at this time  P-SVT with 1st HB - continue metoprolol   Non Cardiac Dizziness - planned f/u with PCP and ENT; resolved  One year with me      Medication Adjustments/Labs and Tests Ordered: Current medicines are reviewed at length with the patient today.  Concerns regarding medicines are outlined above.  No orders of the defined types were placed in this encounter.  No orders of the defined types were placed in this encounter.   Patient Instructions  Medication Instructions:  Your physician recommends that you continue on your current medications as directed. Please refer to the Current Medication list given to you today.  *If you need a refill on your cardiac medications before your next appointment, please call your pharmacy*  Lab Work: None ordered today.  Testing/Procedures: None ordered today.  Follow-Up: At [facility], you and your health needs are our priority.  As part of our continuing mission to provide you with exceptional heart care, we have created  designated Provider Care Teams.  These Care Teams include your primary Cardiologist (physician) and Advanced Practice Providers (APPs -  Physician Assistants and Nurse Practitioners) who all work together to provide you with the care you need, when you need it.  Your next appointment:   1 year(s)  The format for your next appointment:   In Person  Provider:   Christell Constant, MD {   Signed, Christell Constant, MD  02/19/2023 9:01 AM    [facility]

## 2023-02-19 NOTE — Patient Instructions (Addendum)
Medication Instructions:  Your physician recommends that you continue on your current medications as directed. Please refer to the Current Medication list given to you today.  *If you need a refill on your cardiac medications before your next appointment, please call your pharmacy*  Lab Work: None ordered today.  Testing/Procedures: None ordered today.  Follow-Up: At [facility], you and your health needs are our priority.  As part of our continuing mission to provide you with exceptional heart care, we have created designated Provider Care Teams.  These Care Teams include your primary Cardiologist (physician) and Advanced Practice Providers (APPs -  Physician Assistants and Nurse Practitioners) who all work together to provide you with the care you need, when you need it.  Your next appointment:   1 year(s)  The format for your next appointment:   In Person  Provider:   Mahesh A Chandrasekhar, MD { 

## 2023-02-24 ENCOUNTER — Ambulatory Visit
Admission: RE | Admit: 2023-02-24 | Discharge: 2023-02-24 | Disposition: A | Discharge status: Home / Self Care | Payer: Medicare Other | Source: Ambulatory Visit | Attending: Physician Assistant | Admitting: Physician Assistant

## 2023-02-24 DIAGNOSIS — M1712 Unilateral primary osteoarthritis, left knee: Secondary | ICD-10-CM | POA: Diagnosis not present

## 2023-02-24 DIAGNOSIS — M25462 Effusion, left knee: Secondary | ICD-10-CM | POA: Diagnosis not present

## 2023-02-24 DIAGNOSIS — M25562 Pain in left knee: Secondary | ICD-10-CM | POA: Diagnosis not present

## 2023-02-24 DIAGNOSIS — G8929 Other chronic pain: Secondary | ICD-10-CM

## 2023-03-03 ENCOUNTER — Ambulatory Visit: Payer: Medicare Other | Admitting: Orthopaedic Surgery

## 2023-03-03 ENCOUNTER — Encounter: Payer: Self-pay | Admitting: Orthopaedic Surgery

## 2023-03-03 ENCOUNTER — Telehealth: Payer: Self-pay

## 2023-03-03 ENCOUNTER — Other Ambulatory Visit: Payer: Self-pay

## 2023-03-03 DIAGNOSIS — G8929 Other chronic pain: Secondary | ICD-10-CM

## 2023-03-03 DIAGNOSIS — M1712 Unilateral primary osteoarthritis, left knee: Secondary | ICD-10-CM | POA: Diagnosis not present

## 2023-03-03 DIAGNOSIS — M1711 Unilateral primary osteoarthritis, right knee: Secondary | ICD-10-CM | POA: Diagnosis not present

## 2023-03-03 DIAGNOSIS — M25561 Pain in right knee: Secondary | ICD-10-CM

## 2023-03-03 DIAGNOSIS — M25562 Pain in left knee: Secondary | ICD-10-CM

## 2023-03-03 NOTE — Telephone Encounter (Signed)
Bilateral knee gel injections 

## 2023-03-03 NOTE — Progress Notes (Signed)
The patient comes in today to go over a CT scan of her left knee.  She had had a mechanical fall and in light of severe arthritis that she does have in both of her knees, her left knee has become worse.  The CT scan was to rule out any type of occult fracture.  Plain films were reviewed again showing valgus malalignment of her knees and severe lateral compartment wear.  The CT scan is reviewed today and it does show a small fracture line in the far medial and posterior medial tibial plateau with fortunately no depressed component of this fracture.  She does have severe arthritis in the knee regardless.  The only recommendation from a surgical standpoint would be knee replacement surgery.  She has had hyaluronic acid injections in the past for her knees and that did not really help her a whole lot but she would like to at least try that again.  This would be for both knees given the arthritis of both knees.  When we do see her back hopefully to have those injections placed in her knees, we would let her down and placed lidocaine prior to the hyaluronic acid injections.  All questions and concerns were answered addressed.  She agrees with the treatment plan.  She will continue to ambulate slowly and using a cane.  This patient is diagnosed with osteoarthritis of the knee(s).    Radiographs show evidence of joint space narrowing, osteophytes, subchondral sclerosis and/or subchondral cysts.  This patient has knee pain which interferes with functional and activities of daily living.    This patient has experienced inadequate response, adverse effects and/or intolerance with conservative treatments such as acetaminophen, NSAIDS, topical creams, physical therapy or regular exercise, knee bracing and/or weight loss.   This patient has experienced inadequate response or has a contraindication to intra articular steroid injections for at least 3 months.   This patient is not scheduled to have a total knee  replacement within 6 months of starting treatment with viscosupplementation.

## 2023-03-04 NOTE — Telephone Encounter (Signed)
VOB submitted for Durolane, bilateral knee  

## 2023-03-10 ENCOUNTER — Ambulatory Visit: Payer: Medicare Other | Admitting: Internal Medicine

## 2023-03-10 ENCOUNTER — Ambulatory Visit: Payer: Medicare Other | Admitting: Orthopaedic Surgery

## 2023-03-24 ENCOUNTER — Telehealth: Payer: Self-pay

## 2023-03-24 DIAGNOSIS — M1712 Unilateral primary osteoarthritis, left knee: Secondary | ICD-10-CM

## 2023-03-24 DIAGNOSIS — M1711 Unilateral primary osteoarthritis, right knee: Secondary | ICD-10-CM

## 2023-03-24 NOTE — Telephone Encounter (Addendum)
Patient would like to know if the crack on her left knee will heal on it's own.  CB# [phone].  Please advise.  Thank You.

## 2023-03-25 NOTE — Telephone Encounter (Signed)
Called and advised pt. She stated understanding  

## 2023-04-07 ENCOUNTER — Ambulatory Visit: Payer: Medicare Other | Admitting: Orthopaedic Surgery

## 2023-04-07 ENCOUNTER — Encounter: Payer: Self-pay | Admitting: Orthopaedic Surgery

## 2023-04-07 DIAGNOSIS — M1711 Unilateral primary osteoarthritis, right knee: Secondary | ICD-10-CM

## 2023-04-07 DIAGNOSIS — M170 Bilateral primary osteoarthritis of knee: Secondary | ICD-10-CM | POA: Diagnosis not present

## 2023-04-07 DIAGNOSIS — M1712 Unilateral primary osteoarthritis, left knee: Secondary | ICD-10-CM

## 2023-04-07 MED ORDER — SODIUM HYALURONATE 60 MG/3ML IX PRSY
60.0000 mg | PREFILLED_SYRINGE | INTRA_ARTICULAR | Status: AC | PRN
Start: 2023-04-07 — End: 2023-04-07
  Administered 2023-04-07: 60 mg via INTRA_ARTICULAR

## 2023-04-07 NOTE — Progress Notes (Signed)
Procedure Note  Patient: Tahni Keth             Date of Birth: 17-Jan-1935           MRN: [id number]             Visit Date: 04/07/2023  Procedures: Visit Diagnoses:  1. Unilateral primary osteoarthritis, left knee   2. Unilateral primary osteoarthritis, right knee     Large Joint Inj: R knee on 04/07/2023 8:40 AM Indications: diagnostic evaluation and pain Details: 22 G 1.5 in needle, superolateral approach  Arthrogram: No  Medications: 60 mg Sodium Hyaluronate 60 MG/3ML Outcome: tolerated well, no immediate complications Procedure, treatment alternatives, risks and benefits explained, specific risks discussed. Consent was given by the patient. Immediately prior to procedure a time out was called to verify the correct patient, procedure, equipment, support staff and site/side marked as required. Patient was prepped and draped in the usual sterile fashion.    Large Joint Inj: L knee on 04/07/2023 8:41 AM Indications: diagnostic evaluation and pain Details: 22 G 1.5 in needle, superolateral approach  Arthrogram: No  Outcome: tolerated well, no immediate complications Procedure, treatment alternatives, risks and benefits explained, specific risks discussed. Consent was given by the patient. Immediately prior to procedure a time out was called to verify the correct patient, procedure, equipment, support staff and site/side marked as required. Patient was prepped and draped in the usual sterile fashion.    The patient is an 88 year old female who is here today for bilateral knee hyaluronic acid injections with dural Lane to treat the pain from osteoarthritis.  She has tried and failed other conservative treatment measures for these knees including steroid injections.  She did let me know that she does get intermittent swelling in both her knees with the left worse than the right.  Both knees have slight valgus malalignment to them.  She has good range of motion of both knees.   There is no effusion in either knee today.  I did place Durolane in both knees today which she tolerated well.  She knows to wait at least 6 months between these types of injections.  If she is having pain in her knees in December or during the winter we can always place a steroid injection in both knees if needed.  Follow-up otherwise is as needed.  Lot number: [id number]

## 2023-04-27 DIAGNOSIS — Z23 Encounter for immunization: Secondary | ICD-10-CM | POA: Diagnosis not present

## 2023-05-25 DIAGNOSIS — Z0000 Encounter for general adult medical examination without abnormal findings: Secondary | ICD-10-CM | POA: Diagnosis not present

## 2023-05-25 DIAGNOSIS — Z8673 Personal history of transient ischemic attack (TIA), and cerebral infarction without residual deficits: Secondary | ICD-10-CM | POA: Diagnosis not present

## 2023-05-25 DIAGNOSIS — I10 Essential (primary) hypertension: Secondary | ICD-10-CM | POA: Diagnosis not present

## 2023-05-25 DIAGNOSIS — K529 Noninfective gastroenteritis and colitis, unspecified: Secondary | ICD-10-CM | POA: Diagnosis not present

## 2023-05-25 DIAGNOSIS — E559 Vitamin D deficiency, unspecified: Secondary | ICD-10-CM | POA: Diagnosis not present

## 2023-05-25 DIAGNOSIS — K219 Gastro-esophageal reflux disease without esophagitis: Secondary | ICD-10-CM | POA: Diagnosis not present

## 2023-05-25 DIAGNOSIS — Z79899 Other long term (current) drug therapy: Secondary | ICD-10-CM | POA: Diagnosis not present

## 2023-05-25 DIAGNOSIS — E782 Mixed hyperlipidemia: Secondary | ICD-10-CM | POA: Diagnosis not present

## 2023-05-25 DIAGNOSIS — M810 Age-related osteoporosis without current pathological fracture: Secondary | ICD-10-CM | POA: Diagnosis not present

## 2023-05-25 DIAGNOSIS — M170 Bilateral primary osteoarthritis of knee: Secondary | ICD-10-CM | POA: Diagnosis not present

## 2023-06-03 ENCOUNTER — Other Ambulatory Visit: Payer: Self-pay | Admitting: Internal Medicine

## 2023-06-30 DIAGNOSIS — M170 Bilateral primary osteoarthritis of knee: Secondary | ICD-10-CM | POA: Diagnosis not present

## 2023-06-30 DIAGNOSIS — S82002D Unspecified fracture of left patella, subsequent encounter for closed fracture with routine healing: Secondary | ICD-10-CM | POA: Diagnosis not present

## 2023-06-30 DIAGNOSIS — R262 Difficulty in walking, not elsewhere classified: Secondary | ICD-10-CM | POA: Diagnosis not present

## 2023-06-30 DIAGNOSIS — M6281 Muscle weakness (generalized): Secondary | ICD-10-CM | POA: Diagnosis not present

## 2023-07-02 DIAGNOSIS — M170 Bilateral primary osteoarthritis of knee: Secondary | ICD-10-CM | POA: Diagnosis not present

## 2023-07-02 DIAGNOSIS — M6281 Muscle weakness (generalized): Secondary | ICD-10-CM | POA: Diagnosis not present

## 2023-07-02 DIAGNOSIS — R262 Difficulty in walking, not elsewhere classified: Secondary | ICD-10-CM | POA: Diagnosis not present

## 2023-07-02 DIAGNOSIS — S82002D Unspecified fracture of left patella, subsequent encounter for closed fracture with routine healing: Secondary | ICD-10-CM | POA: Diagnosis not present

## 2023-07-05 ENCOUNTER — Telehealth: Payer: Self-pay | Admitting: Physician Assistant

## 2023-07-05 NOTE — Telephone Encounter (Signed)
Called and left message that provider will not be in office this week and need to reschedule with Persons first availability

## 2023-07-06 ENCOUNTER — Ambulatory Visit: Payer: Medicare Other | Admitting: Physician Assistant

## 2023-07-06 DIAGNOSIS — S82002D Unspecified fracture of left patella, subsequent encounter for closed fracture with routine healing: Secondary | ICD-10-CM | POA: Diagnosis not present

## 2023-07-06 DIAGNOSIS — M170 Bilateral primary osteoarthritis of knee: Secondary | ICD-10-CM | POA: Diagnosis not present

## 2023-07-06 DIAGNOSIS — M6281 Muscle weakness (generalized): Secondary | ICD-10-CM | POA: Diagnosis not present

## 2023-07-06 DIAGNOSIS — R262 Difficulty in walking, not elsewhere classified: Secondary | ICD-10-CM | POA: Diagnosis not present

## 2023-07-12 ENCOUNTER — Encounter: Payer: Self-pay | Admitting: Physician Assistant

## 2023-07-12 ENCOUNTER — Ambulatory Visit: Payer: Medicare Other | Admitting: Physician Assistant

## 2023-07-12 DIAGNOSIS — M1711 Unilateral primary osteoarthritis, right knee: Secondary | ICD-10-CM | POA: Diagnosis not present

## 2023-07-12 DIAGNOSIS — M1712 Unilateral primary osteoarthritis, left knee: Secondary | ICD-10-CM | POA: Diagnosis not present

## 2023-07-12 MED ORDER — METHYLPREDNISOLONE ACETATE 40 MG/ML IJ SUSP
40.0000 mg | INTRAMUSCULAR | Status: AC | PRN
Start: 2023-07-12 — End: 2023-07-12
  Administered 2023-07-12: 40 mg via INTRA_ARTICULAR

## 2023-07-12 MED ORDER — LIDOCAINE HCL 1 % IJ SOLN
3.0000 mL | INTRAMUSCULAR | Status: AC | PRN
Start: 2023-07-12 — End: 2023-07-12
  Administered 2023-07-12: 3 mL

## 2023-07-12 NOTE — Progress Notes (Signed)
Office Visit Note   Patient: Lindsey Barnett           Date of Birth: April 02, 1935           MRN: [id number] Visit Date: 07/12/2023              Requested by: Thana Ates, MD [address] [place],  [place] [address] PCP: Thana Ates, MD  No chief complaint on file.     HPI: Lindsey Barnett comes in today requesting bilateral cortisone injections into her knees.  She has had no particular injury just has noticed some soreness come back and her knees had duraline   injections in September  Assessment & Plan: Visit Diagnoses:  1. Unilateral primary osteoarthritis, left knee   2. Unilateral primary osteoarthritis, right knee     Plan: Went forward with bilateral steroid injections today may follow-up as needed she also showed me a small superficial burn from hitting her leg up against an outside Christmas light.  There is no evidence of any cellulitis or infection.  She said is actually getting better.  Advised her to wash it daily with an antibacterial soap and water follow-up if needed  Follow-Up Instructions: No follow-ups on file.   Ortho Exam  Patient is alert, oriented, no adenopathy, well-dressed, normal affect, normal respiratory effort. Bilateral knees no erythema compartments are soft and compressible no effusion.  Negative Denna Haggard' sign she is neurovascular intact she does have a small superficial burn with some blistering on her right anterior shin.  No surrounding cellulitis or drainage  Imaging: No results found. No images are attached to the encounter.  Labs: No results found for: "HGBA1C", "ESRSEDRATE", "CRP", "LABURIC", "REPTSTATUS", "GRAMSTAIN", "CULT", "LABORGA"   Lab Results  Component Value Date   ALBUMIN 3.5 09/22/2015    No results found for: "MG" No results found for: "VD25OH"  No results found for: "PREALBUMIN"    Latest Ref Rng & Units 08/13/2022   12:16 PM 06/22/2019    3:26 PM 03/07/2016    4:41 AM  CBC EXTENDED  WBC 4.0 - 10.5  K/uL 7.8  7.6  7.2   RBC 3.87 - 5.11 MIL/uL 4.76  4.58  4.00   Hemoglobin 12.0 - 15.0 g/dL 95.1  88.4  16.6   HCT 36.0 - 46.0 % 42.9  42.0  34.8   Platelets 150 - 400 K/uL 302  247  227      There is no height or weight on file to calculate BMI.  Orders:  No orders of the defined types were placed in this encounter.  No orders of the defined types were placed in this encounter.    Procedures: Large Joint Inj: bilateral knee on 07/12/2023 1:57 PM Indications: pain and diagnostic evaluation Details: 25 G 1.5 in needle, anteromedial approach  Arthrogram: No  Medications (Right): 3 mL lidocaine 1 %; 40 mg methylPREDNISolone acetate 40 MG/ML Medications (Left): 3 mL lidocaine 1 %; 40 mg methylPREDNISolone acetate 40 MG/ML Outcome: tolerated well, no immediate complications Procedure, treatment alternatives, risks and benefits explained, specific risks discussed. Consent was given by the patient. Immediately prior to procedure a time out was called to verify the correct patient, procedure, equipment, support staff and site/side marked as required. Patient was prepped and draped in the usual sterile fashion.    Clinical Data: No additional findings.  ROS:  All other systems negative, except as noted in the HPI. Review of Systems  Objective: Vital Signs: There were  no vitals taken for this visit.  Specialty Comments:  No specialty comments available.  PMFS History: Patient Active Problem List   Diagnosis Date Noted  . Paroxysmal SVT (supraventricular tachycardia) (HCC) 02/19/2023  . Myalgia due to statin 02/19/2023  . Dizziness 09/12/2021  . Overweight (BMI 25.0-29.9) 09/12/2021  . Chest pain 06/22/2019  . First degree AV block 06/22/2019  . HLD (hyperlipidemia) 06/22/2019  . PAD (peripheral artery disease) (HCC) 06/22/2019  . Impingement syndrome of right shoulder 12/22/2017  . Sciatica, left side 09/08/2017  . Pain due to right hip joint prosthesis (HCC) 03/06/2016   . 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 Hip fracture requiring operative repair (HCC) 09/22/2015  . Hip injury   . Subcapital fracture of hip (HCC) 09/21/2015  . Hypokalemia 09/21/2015  . HTN (hypertension) 09/21/2015  . GERD (gastroesophageal reflux disease) 09/21/2015  . Lateral meniscal tear 09/16/2011   Past Medical History:  Diagnosis Date  . Acute meniscal tear, lateral RIGHT  . Arthritis HIP  . Cataract immature   . GERD (gastroesophageal reflux disease) OCCASIONAL  . History of TIA (transient ischemic attack) 2005--  NO RESIDUAL  . Hyperlipidemia   . Hypertension   . MVP (mitral valve prolapse)   . Peripheral vascular disease (HCC)   . Stress incontinence   . Swelling of right knee joint     Family History  Problem Relation Age of Onset  . Breast cancer Neg Hx     Past Surgical History:  Procedure Laterality Date  . ANTERIOR APPROACH HEMI HIP ARTHROPLASTY Right 09/22/2015   Procedure: ANTERIOR APPROACH HEMI HIP ARTHROPLASTY;  Surgeon: Jodi Geralds, MD;  Location: [facility];  Service: Orthopedics;  Laterality: Right;  . BREAST CYST EXCISION Right   . CHOLECYSTECTOMY  1975  . KNEE ARTHROSCOPY  09/16/2011   Procedure: ARTHROSCOPY KNEE;  Surgeon: Loanne Drilling, MD;  Location: [facility];  Service: Orthopedics;  Laterality: Right;  WITH lateral meniscal debridement  . TOTAL HIP ARTHROPLASTY Right 03/06/2016   Procedure: CONVERSION RIGHT HIP HEMIARTHROPLASTY TO RIGHT TOTAL HIP ARTHROPLASTY ANTERIOR APPROACH;  Surgeon: Kathryne Hitch, MD;  Location: [facility];  Service: Orthopedics;  Laterality: Right;  Marland Kitchen VAGINAL HYSTERECTOMY  1973   Social History   Occupational History  . Not on file  Tobacco Use  . Smoking status: Never  . Smokeless tobacco: Never  Substance and Sexual Activity  . Alcohol use: Not Currently    Comment: OCCASIONAL  . Drug use: No  . Sexual activity: Not on file

## 2023-07-15 DIAGNOSIS — R262 Difficulty in walking, not elsewhere classified: Secondary | ICD-10-CM | POA: Diagnosis not present

## 2023-07-15 DIAGNOSIS — M6281 Muscle weakness (generalized): Secondary | ICD-10-CM | POA: Diagnosis not present

## 2023-07-15 DIAGNOSIS — M170 Bilateral primary osteoarthritis of knee: Secondary | ICD-10-CM | POA: Diagnosis not present

## 2023-07-15 DIAGNOSIS — S82002D Unspecified fracture of left patella, subsequent encounter for closed fracture with routine healing: Secondary | ICD-10-CM | POA: Diagnosis not present

## 2023-07-16 DIAGNOSIS — R262 Difficulty in walking, not elsewhere classified: Secondary | ICD-10-CM | POA: Diagnosis not present

## 2023-07-16 DIAGNOSIS — S82002D Unspecified fracture of left patella, subsequent encounter for closed fracture with routine healing: Secondary | ICD-10-CM | POA: Diagnosis not present

## 2023-07-16 DIAGNOSIS — M6281 Muscle weakness (generalized): Secondary | ICD-10-CM | POA: Diagnosis not present

## 2023-07-16 DIAGNOSIS — M170 Bilateral primary osteoarthritis of knee: Secondary | ICD-10-CM | POA: Diagnosis not present

## 2023-07-19 DIAGNOSIS — M6281 Muscle weakness (generalized): Secondary | ICD-10-CM | POA: Diagnosis not present

## 2023-07-19 DIAGNOSIS — S82002D Unspecified fracture of left patella, subsequent encounter for closed fracture with routine healing: Secondary | ICD-10-CM | POA: Diagnosis not present

## 2023-07-19 DIAGNOSIS — R262 Difficulty in walking, not elsewhere classified: Secondary | ICD-10-CM | POA: Diagnosis not present

## 2023-07-19 DIAGNOSIS — M170 Bilateral primary osteoarthritis of knee: Secondary | ICD-10-CM | POA: Diagnosis not present

## 2023-07-23 DIAGNOSIS — S82002D Unspecified fracture of left patella, subsequent encounter for closed fracture with routine healing: Secondary | ICD-10-CM | POA: Diagnosis not present

## 2023-07-23 DIAGNOSIS — M6281 Muscle weakness (generalized): Secondary | ICD-10-CM | POA: Diagnosis not present

## 2023-07-23 DIAGNOSIS — R262 Difficulty in walking, not elsewhere classified: Secondary | ICD-10-CM | POA: Diagnosis not present

## 2023-07-23 DIAGNOSIS — M170 Bilateral primary osteoarthritis of knee: Secondary | ICD-10-CM | POA: Diagnosis not present

## 2023-07-30 DIAGNOSIS — M6281 Muscle weakness (generalized): Secondary | ICD-10-CM | POA: Diagnosis not present

## 2023-07-30 DIAGNOSIS — R262 Difficulty in walking, not elsewhere classified: Secondary | ICD-10-CM | POA: Diagnosis not present

## 2023-07-30 DIAGNOSIS — M170 Bilateral primary osteoarthritis of knee: Secondary | ICD-10-CM | POA: Diagnosis not present

## 2023-07-30 DIAGNOSIS — S82002D Unspecified fracture of left patella, subsequent encounter for closed fracture with routine healing: Secondary | ICD-10-CM | POA: Diagnosis not present

## 2023-08-10 DIAGNOSIS — M6281 Muscle weakness (generalized): Secondary | ICD-10-CM | POA: Diagnosis not present

## 2023-08-10 DIAGNOSIS — M170 Bilateral primary osteoarthritis of knee: Secondary | ICD-10-CM | POA: Diagnosis not present

## 2023-08-10 DIAGNOSIS — R262 Difficulty in walking, not elsewhere classified: Secondary | ICD-10-CM | POA: Diagnosis not present

## 2023-08-10 DIAGNOSIS — S82002D Unspecified fracture of left patella, subsequent encounter for closed fracture with routine healing: Secondary | ICD-10-CM | POA: Diagnosis not present

## 2023-08-13 DIAGNOSIS — S82002D Unspecified fracture of left patella, subsequent encounter for closed fracture with routine healing: Secondary | ICD-10-CM | POA: Diagnosis not present

## 2023-08-13 DIAGNOSIS — M6281 Muscle weakness (generalized): Secondary | ICD-10-CM | POA: Diagnosis not present

## 2023-08-13 DIAGNOSIS — R262 Difficulty in walking, not elsewhere classified: Secondary | ICD-10-CM | POA: Diagnosis not present

## 2023-08-13 DIAGNOSIS — M170 Bilateral primary osteoarthritis of knee: Secondary | ICD-10-CM | POA: Diagnosis not present

## 2023-08-17 ENCOUNTER — Other Ambulatory Visit: Payer: Self-pay | Admitting: Internal Medicine

## 2023-08-17 DIAGNOSIS — Z1231 Encounter for screening mammogram for malignant neoplasm of breast: Secondary | ICD-10-CM

## 2023-08-31 DIAGNOSIS — R262 Difficulty in walking, not elsewhere classified: Secondary | ICD-10-CM | POA: Diagnosis not present

## 2023-08-31 DIAGNOSIS — M170 Bilateral primary osteoarthritis of knee: Secondary | ICD-10-CM | POA: Diagnosis not present

## 2023-08-31 DIAGNOSIS — S82002D Unspecified fracture of left patella, subsequent encounter for closed fracture with routine healing: Secondary | ICD-10-CM | POA: Diagnosis not present

## 2023-08-31 DIAGNOSIS — M6281 Muscle weakness (generalized): Secondary | ICD-10-CM | POA: Diagnosis not present

## 2023-09-03 DIAGNOSIS — M170 Bilateral primary osteoarthritis of knee: Secondary | ICD-10-CM | POA: Diagnosis not present

## 2023-09-03 DIAGNOSIS — S82002D Unspecified fracture of left patella, subsequent encounter for closed fracture with routine healing: Secondary | ICD-10-CM | POA: Diagnosis not present

## 2023-09-03 DIAGNOSIS — M6281 Muscle weakness (generalized): Secondary | ICD-10-CM | POA: Diagnosis not present

## 2023-09-03 DIAGNOSIS — R262 Difficulty in walking, not elsewhere classified: Secondary | ICD-10-CM | POA: Diagnosis not present

## 2023-09-27 ENCOUNTER — Ambulatory Visit
Admission: RE | Admit: 2023-09-27 | Discharge: 2023-09-27 | Disposition: A | Discharge status: Home / Self Care | Payer: Medicare Other | Source: Ambulatory Visit | Attending: Internal Medicine | Admitting: Internal Medicine

## 2023-09-27 DIAGNOSIS — Z1231 Encounter for screening mammogram for malignant neoplasm of breast: Secondary | ICD-10-CM | POA: Diagnosis not present

## 2023-09-29 DIAGNOSIS — I10 Essential (primary) hypertension: Secondary | ICD-10-CM | POA: Diagnosis not present

## 2023-09-29 DIAGNOSIS — H43393 Other vitreous opacities, bilateral: Secondary | ICD-10-CM | POA: Diagnosis not present

## 2023-09-29 DIAGNOSIS — H53143 Visual discomfort, bilateral: Secondary | ICD-10-CM | POA: Diagnosis not present

## 2023-09-29 DIAGNOSIS — H35033 Hypertensive retinopathy, bilateral: Secondary | ICD-10-CM | POA: Diagnosis not present

## 2023-10-04 DIAGNOSIS — K529 Noninfective gastroenteritis and colitis, unspecified: Secondary | ICD-10-CM | POA: Diagnosis not present

## 2023-10-04 DIAGNOSIS — M25531 Pain in right wrist: Secondary | ICD-10-CM | POA: Diagnosis not present

## 2023-10-04 DIAGNOSIS — S52501A Unspecified fracture of the lower end of right radius, initial encounter for closed fracture: Secondary | ICD-10-CM | POA: Diagnosis not present

## 2023-10-05 DIAGNOSIS — S52501A Unspecified fracture of the lower end of right radius, initial encounter for closed fracture: Secondary | ICD-10-CM | POA: Diagnosis not present

## 2023-10-08 ENCOUNTER — Telehealth: Payer: Self-pay | Admitting: Orthopaedic Surgery

## 2023-10-08 NOTE — Telephone Encounter (Signed)
 Patient called. Would like gel injection in her knee.

## 2023-10-11 ENCOUNTER — Telehealth: Payer: Self-pay | Admitting: Orthopaedic Surgery

## 2023-10-11 DIAGNOSIS — S52501A Unspecified fracture of the lower end of right radius, initial encounter for closed fracture: Secondary | ICD-10-CM | POA: Diagnosis not present

## 2023-10-11 NOTE — Telephone Encounter (Signed)
 Pt called about an update for gel injection. Please call pt at [phone].

## 2023-10-15 NOTE — Telephone Encounter (Signed)
VOB submitted for durolane, bilateral knee  

## 2023-10-15 NOTE — Telephone Encounter (Signed)
Patient scheduled for gel injection. 

## 2023-10-19 ENCOUNTER — Other Ambulatory Visit: Payer: Self-pay

## 2023-10-19 DIAGNOSIS — M1711 Unilateral primary osteoarthritis, right knee: Secondary | ICD-10-CM

## 2023-10-19 DIAGNOSIS — M1712 Unilateral primary osteoarthritis, left knee: Secondary | ICD-10-CM

## 2023-10-21 ENCOUNTER — Encounter: Payer: Self-pay | Admitting: Physician Assistant

## 2023-10-21 ENCOUNTER — Ambulatory Visit: Admitting: Physician Assistant

## 2023-10-21 DIAGNOSIS — G8929 Other chronic pain: Secondary | ICD-10-CM | POA: Diagnosis not present

## 2023-10-21 DIAGNOSIS — M170 Bilateral primary osteoarthritis of knee: Secondary | ICD-10-CM

## 2023-10-21 MED ORDER — SODIUM HYALURONATE 60 MG/3ML IX PRSY
60.0000 mg | PREFILLED_SYRINGE | Freq: Once | INTRA_ARTICULAR | Status: AC
Start: 2023-10-21 — End: 2023-10-21
  Administered 2023-10-21: 60 mg via INTRA_ARTICULAR

## 2023-10-21 MED ORDER — SODIUM HYALURONATE 60 MG/3ML IX PRSY
60.0000 mg | PREFILLED_SYRINGE | INTRA_ARTICULAR | Status: AC | PRN
Start: 2023-10-21 — End: 2023-10-21
  Administered 2023-10-21: 60 mg via INTRA_ARTICULAR

## 2023-10-21 NOTE — Progress Notes (Signed)
 Office Visit Note   Patient: Lindsey Barnett           Date of Birth: September 02, 1934           MRN: [id number] Visit Date: 10/21/2023              Requested by: Thana Ates, MD [address] [place],  [place] [address] PCP: Thana Ates, MD  Chief Complaint  Patient presents with  . Right Knee - Injections    Durolane injection    . Left Knee - Injections    Durolane injection       HPI: Lindsey Barnett comes in today for her Duralone injections into her bilateral knees.  She has done well with these in the past no new injuries  Assessment & Plan: Visit Diagnoses:  1. Chronic pain of right knee   2. Chronic pain of left knee     Plan: Went forward with Durolane today may follow-up as needed  Follow-Up Instructions: No follow-ups on file.   Ortho Exam  Patient is alert, oriented, no adenopathy, well-dressed, normal affect, normal respiratory effort. Examination bilateral knees no effusion or erythema compartments are soft and nontender she is neurovascular intact  Imaging: No results found. No images are attached to the encounter.  Labs: No results found for: "HGBA1C", "ESRSEDRATE", "CRP", "LABURIC", "REPTSTATUS", "GRAMSTAIN", "CULT", "LABORGA"   Lab Results  Component Value Date   ALBUMIN 3.5 09/22/2015    No results found for: "MG" No results found for: "VD25OH"  No results found for: "PREALBUMIN"    Latest Ref Rng & Units 08/13/2022   12:16 PM 06/22/2019    3:26 PM 03/07/2016    4:41 AM  CBC EXTENDED  WBC 4.0 - 10.5 K/uL 7.8  7.6  7.2   RBC 3.87 - 5.11 MIL/uL 4.76  4.58  4.00   Hemoglobin 12.0 - 15.0 g/dL 46.9  62.9  52.8   HCT 36.0 - 46.0 % 42.9  42.0  34.8   Platelets 150 - 400 K/uL 302  247  227      There is no height or weight on file to calculate BMI.  Orders:  No orders of the defined types were placed in this encounter.  Meds ordered this encounter  Medications  . Sodium Hyaluronate (DUROLANE) intra-articular injection 60 mg   . Sodium Hyaluronate (DUROLANE) intra-articular injection 60 mg     Procedures: Large Joint Inj: bilateral knee on 10/21/2023 11:21 AM Indications: pain and diagnostic evaluation Details: 22 G 1.5 in needle  Arthrogram: No  Medications (Right): 60 mg Sodium Hyaluronate 60 MG/3ML Medications (Left): 60 mg Sodium Hyaluronate 60 MG/3ML Outcome: tolerated well, no immediate complications Procedure, treatment alternatives, risks and benefits explained, specific risks discussed. Consent was given by the patient.    Clinical Data: No additional findings.  ROS:  All other systems negative, except as noted in the HPI. Review of Systems  Objective: Vital Signs: There were no vitals taken for this visit.  Specialty Comments:  No specialty comments available.  PMFS History: Patient Active Problem List   Diagnosis Date Noted  . Paroxysmal SVT (supraventricular tachycardia) (HCC) 02/19/2023  . Myalgia due to statin 02/19/2023  . Dizziness 09/12/2021  . Overweight (BMI 25.0-29.9) 09/12/2021  . Chest pain 06/22/2019  . First degree AV block 06/22/2019  . HLD (hyperlipidemia) 06/22/2019  . PAD (peripheral artery disease) (HCC) 06/22/2019  . Impingement syndrome of right shoulder 12/22/2017  . Sciatica, left side 09/08/2017  .  Pain due to right hip joint prosthesis (HCC) 03/06/2016  . 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 Hip fracture requiring operative repair (HCC) 09/22/2015  . Hip injury   . Subcapital fracture of hip (HCC) 09/21/2015  . Hypokalemia 09/21/2015  . HTN (hypertension) 09/21/2015  . GERD (gastroesophageal reflux disease) 09/21/2015  . Lateral meniscal tear 09/16/2011   Past Medical History:  Diagnosis Date  . Acute meniscal tear, lateral RIGHT  . Arthritis HIP  . Cataract immature   . GERD (gastroesophageal reflux disease) OCCASIONAL  . History of TIA (transient ischemic attack) 2005--  NO RESIDUAL  . Hyperlipidemia   . Hypertension   .  MVP (mitral valve prolapse)   . Peripheral vascular disease (HCC)   . Stress incontinence   . Swelling of right knee joint     Family History  Problem Relation Age of Onset  . Breast cancer Neg Hx     Past Surgical History:  Procedure Laterality Date  . ANTERIOR APPROACH HEMI HIP ARTHROPLASTY Right 09/22/2015   Procedure: ANTERIOR APPROACH HEMI HIP ARTHROPLASTY;  Surgeon: Jodi Geralds, MD;  Location: [facility];  Service: Orthopedics;  Laterality: Right;  . BREAST CYST EXCISION Right   . CHOLECYSTECTOMY  1975  . KNEE ARTHROSCOPY  09/16/2011   Procedure: ARTHROSCOPY KNEE;  Surgeon: Loanne Drilling, MD;  Location: [facility];  Service: Orthopedics;  Laterality: Right;  WITH lateral meniscal debridement  . TOTAL HIP ARTHROPLASTY Right 03/06/2016   Procedure: CONVERSION RIGHT HIP HEMIARTHROPLASTY TO RIGHT TOTAL HIP ARTHROPLASTY ANTERIOR APPROACH;  Surgeon: Kathryne Hitch, MD;  Location: [facility];  Service: Orthopedics;  Laterality: Right;  Marland Kitchen VAGINAL HYSTERECTOMY  1973   Social History   Occupational History  . Not on file  Tobacco Use  . Smoking status: Never  . Smokeless tobacco: Never  Substance and Sexual Activity  . Alcohol use: Not Currently    Comment: OCCASIONAL  . Drug use: No  . Sexual activity: Not on file

## 2023-10-22 ENCOUNTER — Encounter: Payer: Self-pay | Admitting: Physician Assistant

## 2023-10-22 ENCOUNTER — Ambulatory Visit: Admitting: Physician Assistant

## 2023-10-22 ENCOUNTER — Telehealth: Payer: Self-pay | Admitting: Physician Assistant

## 2023-10-22 DIAGNOSIS — M1711 Unilateral primary osteoarthritis, right knee: Secondary | ICD-10-CM

## 2023-10-22 NOTE — Telephone Encounter (Signed)
 Patient called and said after the shot she had a reaction. Her right knee swollen. CB#[id number]

## 2023-10-22 NOTE — Progress Notes (Signed)
 Office Visit Note   Patient: Lindsey Barnett           Date of Birth: 1935-01-15           MRN: [id number] Visit Date: 10/22/2023              Requested by: Thana Ates, MD [address] [place],  [place] [address] PCP: Thana Ates, MD  Chief Complaint  Patient presents with   Right Knee - Follow-up      HPI: Tammela is a pleasant 89 year old woman who underwent bilateral viscosupplementation's yesterday with myself.  She comes in today saying that she had significant pain and swelling in her right knee last night.  At the time of the injection it was more painful.  She denies any fever or chills.  She has been icing her knee but keeping it moving she does feel like a lot of the swelling is gone down still somewhat painful  Assessment & Plan: Visit Diagnoses: Right knee pain status post viscosupplementation.  Plan: I see no sign of infection today her compartments are soft and nontender she can bend and extend her knee now which she could not last night.  She does have a mild effusion I told her I could aspirate this however because it is getting better she would prefer not to.  Will ice take Tylenol over the weekend of course if given her return precautions she certainly can come back and see me next week  Follow-Up Instructions: Return if symptoms worsen or fail to improve.   Ortho Exam  Patient is alert, oriented, no adenopathy, well-dressed, normal affect, normal respiratory effort. Right knee no redness no erythema no induration she does have a mild effusion.  Compartments are soft and compressible negative Denna Haggard' sign she can straighten and flex her knee to about 95.  Minimally tender to palpation  Imaging: No results found. No images are attached to the encounter.  Labs: No results found for: "HGBA1C", "ESRSEDRATE", "CRP", "LABURIC", "REPTSTATUS", "GRAMSTAIN", "CULT", "LABORGA"   Lab Results  Component Value Date   ALBUMIN 3.5 09/22/2015     No results found for: "MG" No results found for: "VD25OH"  No results found for: "PREALBUMIN"    Latest Ref Rng & Units 08/13/2022   12:16 PM 06/22/2019    3:26 PM 03/07/2016    4:41 AM  CBC EXTENDED  WBC 4.0 - 10.5 K/uL 7.8  7.6  7.2   RBC 3.87 - 5.11 MIL/uL 4.76  4.58  4.00   Hemoglobin 12.0 - 15.0 g/dL 19.1  47.8  29.5   HCT 36.0 - 46.0 % 42.9  42.0  34.8   Platelets 150 - 400 K/uL 302  247  227      There is no height or weight on file to calculate BMI.  Orders:  No orders of the defined types were placed in this encounter.  No orders of the defined types were placed in this encounter.    Procedures: No procedures performed  Clinical Data: No additional findings.  ROS:  All other systems negative, except as noted in the HPI. Review of Systems  Objective: Vital Signs: There were no vitals taken for this visit.  Specialty Comments:  No specialty comments available.  PMFS History: Patient Active Problem List   Diagnosis Date Noted   Paroxysmal SVT (supraventricular tachycardia) (HCC) 02/19/2023   Myalgia due to statin 02/19/2023   Dizziness 09/12/2021   Overweight (BMI 25.0-29.9) 09/12/2021  Chest pain 06/22/2019   First degree AV block 06/22/2019   HLD (hyperlipidemia) 06/22/2019   PAD (peripheral artery disease) (HCC) 06/22/2019   Impingement syndrome of right shoulder 12/22/2017   Sciatica, left side 09/08/2017   Pain due to right hip joint prosthesis (HCC) 03/06/2016   Status post total replacement of right hip 03/06/2016   Hip fracture requiring operative repair (HCC) 09/22/2015   Hip injury    Subcapital fracture of hip (HCC) 09/21/2015   Hypokalemia 09/21/2015   HTN (hypertension) 09/21/2015   GERD (gastroesophageal reflux disease) 09/21/2015   Lateral meniscal tear 09/16/2011   Past Medical History:  Diagnosis Date   Acute meniscal tear, lateral RIGHT   Arthritis HIP   Cataract immature    GERD (gastroesophageal reflux disease)  OCCASIONAL   History of TIA (transient ischemic attack) 2005--  NO RESIDUAL   Hyperlipidemia    Hypertension    MVP (mitral valve prolapse)    Peripheral vascular disease (HCC)    Stress incontinence    Swelling of right knee joint     Family History  Problem Relation Age of Onset   Breast cancer Neg Hx     Past Surgical History:  Procedure Laterality Date   ANTERIOR APPROACH HEMI HIP ARTHROPLASTY Right 09/22/2015   Procedure: ANTERIOR APPROACH HEMI HIP ARTHROPLASTY;  Surgeon: Jodi Geralds, MD;  Location: [facility];  Service: Orthopedics;  Laterality: Right;   BREAST CYST EXCISION Right    CHOLECYSTECTOMY  1975   KNEE ARTHROSCOPY  09/16/2011   Procedure: ARTHROSCOPY KNEE;  Surgeon: Loanne Drilling, MD;  Location: [facility];  Service: Orthopedics;  Laterality: Right;  WITH lateral meniscal debridement   TOTAL HIP ARTHROPLASTY Right 03/06/2016   Procedure: CONVERSION RIGHT HIP HEMIARTHROPLASTY TO RIGHT TOTAL HIP ARTHROPLASTY ANTERIOR APPROACH;  Surgeon: Kathryne Hitch, MD;  Location: [facility];  Service: Orthopedics;  Laterality: Right;   VAGINAL HYSTERECTOMY  1973   Social History   Occupational History   Not on file  Tobacco Use   Smoking status: Never   Smokeless tobacco: Never  Substance and Sexual Activity   Alcohol use: Not Currently    Comment: OCCASIONAL   Drug use: No   Sexual activity: Not on file

## 2023-11-01 ENCOUNTER — Encounter: Payer: Self-pay | Admitting: Orthopaedic Surgery

## 2023-11-01 DIAGNOSIS — S52501A Unspecified fracture of the lower end of right radius, initial encounter for closed fracture: Secondary | ICD-10-CM | POA: Diagnosis not present

## 2023-11-09 DIAGNOSIS — J069 Acute upper respiratory infection, unspecified: Secondary | ICD-10-CM | POA: Diagnosis not present

## 2023-11-15 DIAGNOSIS — M25531 Pain in right wrist: Secondary | ICD-10-CM | POA: Diagnosis not present

## 2023-11-15 DIAGNOSIS — M1811 Unilateral primary osteoarthritis of first carpometacarpal joint, right hand: Secondary | ICD-10-CM | POA: Diagnosis not present

## 2023-11-24 DIAGNOSIS — I10 Essential (primary) hypertension: Secondary | ICD-10-CM | POA: Diagnosis not present

## 2023-11-24 DIAGNOSIS — K529 Noninfective gastroenteritis and colitis, unspecified: Secondary | ICD-10-CM | POA: Diagnosis not present

## 2023-11-24 DIAGNOSIS — Z8673 Personal history of transient ischemic attack (TIA), and cerebral infarction without residual deficits: Secondary | ICD-10-CM | POA: Diagnosis not present

## 2023-11-24 DIAGNOSIS — M170 Bilateral primary osteoarthritis of knee: Secondary | ICD-10-CM | POA: Diagnosis not present

## 2023-11-24 DIAGNOSIS — M810 Age-related osteoporosis without current pathological fracture: Secondary | ICD-10-CM | POA: Diagnosis not present

## 2023-12-15 DIAGNOSIS — R300 Dysuria: Secondary | ICD-10-CM | POA: Diagnosis not present

## 2024-01-17 DIAGNOSIS — M1712 Unilateral primary osteoarthritis, left knee: Secondary | ICD-10-CM | POA: Diagnosis not present

## 2024-01-17 DIAGNOSIS — M810 Age-related osteoporosis without current pathological fracture: Secondary | ICD-10-CM | POA: Diagnosis not present

## 2024-01-17 DIAGNOSIS — E782 Mixed hyperlipidemia: Secondary | ICD-10-CM | POA: Diagnosis not present

## 2024-01-17 DIAGNOSIS — M170 Bilateral primary osteoarthritis of knee: Secondary | ICD-10-CM | POA: Diagnosis not present

## 2024-01-31 ENCOUNTER — Telehealth: Payer: Self-pay | Admitting: Orthopaedic Surgery

## 2024-01-31 ENCOUNTER — Encounter: Payer: Self-pay | Admitting: Physician Assistant

## 2024-01-31 ENCOUNTER — Telehealth: Payer: Self-pay | Admitting: Physician Assistant

## 2024-01-31 ENCOUNTER — Ambulatory Visit: Admitting: Physician Assistant

## 2024-01-31 DIAGNOSIS — M170 Bilateral primary osteoarthritis of knee: Secondary | ICD-10-CM | POA: Diagnosis not present

## 2024-01-31 MED ORDER — LIDOCAINE HCL 1 % IJ SOLN
3.0000 mL | INTRAMUSCULAR | Status: AC | PRN
Start: 2024-01-31 — End: 2024-01-31
  Administered 2024-01-31: 3 mL

## 2024-01-31 MED ORDER — METHYLPREDNISOLONE ACETATE 40 MG/ML IJ SUSP
40.0000 mg | INTRAMUSCULAR | Status: AC | PRN
Start: 2024-01-31 — End: 2024-01-31
  Administered 2024-01-31: 40 mg via INTRA_ARTICULAR

## 2024-01-31 NOTE — Progress Notes (Signed)
 Office Visit Note   Patient: Lindsey Barnett           Date of Birth: 1934-08-27           MRN: [id number] Visit Date: 01/31/2024              Requested by: Dwight Trula SQUIBB, MD [address] [place],  [place] [address] PCP: Dwight Trula SQUIBB, MD  Chief Complaint  Patient presents with  . Right Knee - Follow-up  . Left Knee - Follow-up      HPI: Patient comes in today for bilateral steroid injections into her knees.  She is about 3 months status post bilateral Duoline injections.  She did have some problems with pain in the right knee after the injection.  She admits she did get some relief but it took a few weeks.  Would like steroid injections today  Assessment & Plan: Visit Diagnoses:  1. Primary osteoarthritis of both knees     Plan: \Went forward with bilateral steroid injections may follow-up as needed  Follow-Up Instructions: No follow-ups on file.   Ortho Exam  Patient is alert, oriented, no adenopathy, well-dressed, normal affect, normal respiratory effort. Bilateral knees no erythema no effusion compartments are soft and compressible she is neurovascular intact no evidence of significant swelling    Imaging: No results found. No images are attached to the encounter.  Labs: No results found for: HGBA1C, ESRSEDRATE, CRP, LABURIC, REPTSTATUS, GRAMSTAIN, CULT, LABORGA   Lab Results  Component Value Date   ALBUMIN 3.5 09/22/2015    No results found for: MG No results found for: VD25OH  No results found for: PREALBUMIN    Latest Ref Rng & Units 08/13/2022   12:16 PM 06/22/2019    3:26 PM 03/07/2016    4:41 AM  CBC EXTENDED  WBC 4.0 - 10.5 K/uL 7.8  7.6  7.2   RBC 3.87 - 5.11 MIL/uL 4.76  4.58  4.00   Hemoglobin 12.0 - 15.0 g/dL 86.5  86.2  88.6   HCT 36.0 - 46.0 % 42.9  42.0  34.8   Platelets 150 - 400 K/uL 302  247  227      There is no height or weight on file to calculate BMI.  Orders:  No orders of the  defined types were placed in this encounter.  No orders of the defined types were placed in this encounter.    Procedures: Large Joint Inj: bilateral knee on 01/31/2024 3:59 PM Indications: pain and diagnostic evaluation Details: 25 G 1.5 in needle, anteromedial approach  Arthrogram: No  Medications (Right): 3 mL lidocaine  1 %; 40 mg methylPREDNISolone  acetate 40 MG/ML Medications (Left): 3 mL lidocaine  1 %; 40 mg methylPREDNISolone  acetate 40 MG/ML Outcome: tolerated well, no immediate complications Procedure, treatment alternatives, risks and benefits explained, specific risks discussed. Consent was given by the patient.     Clinical Data: No additional findings.  ROS:  All other systems negative, except as noted in the HPI. Review of Systems  Objective: Vital Signs: There were no vitals taken for this visit.  Specialty Comments:  No specialty comments available.  PMFS History: Patient Active Problem List   Diagnosis Date Noted  . Osteoarthritis of knees, bilateral 01/31/2024  . Paroxysmal SVT (supraventricular tachycardia) (HCC) 02/19/2023  . Myalgia due to statin 02/19/2023  . Dizziness 09/12/2021  . Overweight (BMI 25.0-29.9) 09/12/2021  . Chest pain 06/22/2019  . First degree AV block 06/22/2019  . HLD (hyperlipidemia)  06/22/2019  . PAD (peripheral artery disease) (HCC) 06/22/2019  . Impingement syndrome of right shoulder 12/22/2017  . Sciatica, left side 09/08/2017  . Pain due to right hip joint prosthesis (HCC) 03/06/2016  . 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 Hip fracture requiring operative repair (HCC) 09/22/2015  . Hip injury   . Subcapital fracture of hip (HCC) 09/21/2015  . Hypokalemia 09/21/2015  . HTN (hypertension) 09/21/2015  . GERD (gastroesophageal reflux disease) 09/21/2015  . Lateral meniscal tear 09/16/2011   Past Medical History:  Diagnosis Date  . Acute meniscal tear, lateral RIGHT  . Arthritis HIP  . Cataract  immature   . GERD (gastroesophageal reflux disease) OCCASIONAL  . History of TIA (transient ischemic attack) 2005--  NO RESIDUAL  . Hyperlipidemia   . Hypertension   . MVP (mitral valve prolapse)   . Peripheral vascular disease (HCC)   . Stress incontinence   . Swelling of right knee joint     Family History  Problem Relation Age of Onset  . Breast cancer Neg Hx     Past Surgical History:  Procedure Laterality Date  . ANTERIOR APPROACH HEMI HIP ARTHROPLASTY Right 09/22/2015   Procedure: ANTERIOR APPROACH HEMI HIP ARTHROPLASTY;  Surgeon: Norleen Gavel, MD;  Location: [facility];  Service: Orthopedics;  Laterality: Right;  . BREAST CYST EXCISION Right   . CHOLECYSTECTOMY  1975  . KNEE ARTHROSCOPY  09/16/2011   Procedure: ARTHROSCOPY KNEE;  Surgeon: Dempsey LULLA Moan, MD;  Location: [facility];  Service: Orthopedics;  Laterality: Right;  WITH lateral meniscal debridement  . TOTAL HIP ARTHROPLASTY Right 03/06/2016   Procedure: CONVERSION RIGHT HIP HEMIARTHROPLASTY TO RIGHT TOTAL HIP ARTHROPLASTY ANTERIOR APPROACH;  Surgeon: Lonni CINDERELLA Poli, MD;  Location: [facility];  Service: Orthopedics;  Laterality: Right;  SABRA VAGINAL HYSTERECTOMY  1973   Social History   Occupational History  . Not on file  Tobacco Use  . Smoking status: Never  . Smokeless tobacco: Never  Substance and Sexual Activity  . Alcohol use: Not Currently    Comment: OCCASIONAL  . Drug use: No  . Sexual activity: Not on file

## 2024-01-31 NOTE — Telephone Encounter (Signed)
 Patient called and left message on triage phone that she wants injections in her knees.   She was here on 10/2023 and had bilateral durolane injections at that time.

## 2024-02-09 DIAGNOSIS — L570 Actinic keratosis: Secondary | ICD-10-CM | POA: Diagnosis not present

## 2024-02-09 DIAGNOSIS — L821 Other seborrheic keratosis: Secondary | ICD-10-CM | POA: Diagnosis not present

## 2024-02-09 DIAGNOSIS — L578 Other skin changes due to chronic exposure to nonionizing radiation: Secondary | ICD-10-CM | POA: Diagnosis not present

## 2024-02-09 DIAGNOSIS — Z85828 Personal history of other malignant neoplasm of skin: Secondary | ICD-10-CM | POA: Diagnosis not present

## 2024-02-09 DIAGNOSIS — D225 Melanocytic nevi of trunk: Secondary | ICD-10-CM | POA: Diagnosis not present

## 2024-02-17 DIAGNOSIS — M1712 Unilateral primary osteoarthritis, left knee: Secondary | ICD-10-CM | POA: Diagnosis not present

## 2024-02-17 DIAGNOSIS — M170 Bilateral primary osteoarthritis of knee: Secondary | ICD-10-CM | POA: Diagnosis not present

## 2024-02-17 DIAGNOSIS — M810 Age-related osteoporosis without current pathological fracture: Secondary | ICD-10-CM | POA: Diagnosis not present

## 2024-02-17 DIAGNOSIS — E782 Mixed hyperlipidemia: Secondary | ICD-10-CM | POA: Diagnosis not present

## 2024-02-27 ENCOUNTER — Other Ambulatory Visit: Payer: Self-pay | Admitting: Internal Medicine

## 2024-03-19 DIAGNOSIS — M810 Age-related osteoporosis without current pathological fracture: Secondary | ICD-10-CM | POA: Diagnosis not present

## 2024-03-19 DIAGNOSIS — M1712 Unilateral primary osteoarthritis, left knee: Secondary | ICD-10-CM | POA: Diagnosis not present

## 2024-03-19 DIAGNOSIS — E782 Mixed hyperlipidemia: Secondary | ICD-10-CM | POA: Diagnosis not present

## 2024-03-19 DIAGNOSIS — M170 Bilateral primary osteoarthritis of knee: Secondary | ICD-10-CM | POA: Diagnosis not present

## 2024-04-16 ENCOUNTER — Other Ambulatory Visit: Payer: Self-pay | Admitting: Internal Medicine

## 2024-04-18 ENCOUNTER — Telehealth: Payer: Self-pay | Admitting: Internal Medicine

## 2024-04-18 DIAGNOSIS — M810 Age-related osteoporosis without current pathological fracture: Secondary | ICD-10-CM | POA: Diagnosis not present

## 2024-04-18 DIAGNOSIS — M1712 Unilateral primary osteoarthritis, left knee: Secondary | ICD-10-CM | POA: Diagnosis not present

## 2024-04-18 DIAGNOSIS — M170 Bilateral primary osteoarthritis of knee: Secondary | ICD-10-CM | POA: Diagnosis not present

## 2024-04-18 DIAGNOSIS — E782 Mixed hyperlipidemia: Secondary | ICD-10-CM | POA: Diagnosis not present

## 2024-04-18 MED ORDER — EZETIMIBE 10 MG PO TABS: 10.0000 mg | ORAL_TABLET | Freq: Every day | ORAL | 1 refills | Status: DC

## 2024-04-18 MED ORDER — IRBESARTAN 300 MG PO TABS: 300.0000 mg | ORAL_TABLET | Freq: Every day | ORAL | 1 refills | Status: DC

## 2024-04-18 NOTE — Telephone Encounter (Signed)
*  STAT* If patient is at the pharmacy, call can be transferred to refill team.   1. Which medications need to be refilled? (please list name of each medication and dose if known)   ezetimibe  (ZETIA ) 10 MG tablet    irbesartan  (AVAPRO ) 300 MG tablet   2. Which pharmacy/location (including street and city if local pharmacy) is medication to be sent to? [facility] [place], [place] - [address] Phone: [phone]  Fax: [phone]     3. Do they need a 30 day or 90 day supply? Pt has made an appt for  05/25/24 please refill until then

## 2024-04-18 NOTE — Telephone Encounter (Signed)
 Pt's medications were sent to pt's pharmacy as requested. Confirmation received.

## 2024-04-20 ENCOUNTER — Other Ambulatory Visit: Payer: Self-pay

## 2024-04-20 DIAGNOSIS — M170 Bilateral primary osteoarthritis of knee: Secondary | ICD-10-CM

## 2024-04-24 ENCOUNTER — Encounter: Payer: Self-pay | Admitting: Orthopaedic Surgery

## 2024-04-24 ENCOUNTER — Ambulatory Visit (INDEPENDENT_AMBULATORY_CARE_PROVIDER_SITE_OTHER): Admitting: Orthopaedic Surgery

## 2024-04-24 DIAGNOSIS — M170 Bilateral primary osteoarthritis of knee: Secondary | ICD-10-CM

## 2024-04-24 MED ORDER — SODIUM HYALURONATE 60 MG/3ML IX PRSY
60.0000 mg | PREFILLED_SYRINGE | INTRA_ARTICULAR | Status: AC | PRN
Start: 2024-04-24 — End: 2024-04-24
  Administered 2024-04-24: 60 mg via INTRA_ARTICULAR

## 2024-04-24 NOTE — Progress Notes (Signed)
   Procedure Note  Patient: Lindsey Barnett             Date of Birth: Nov 21, 1934           MRN: [id number]             Visit Date: 04/24/2024  Procedures: Visit Diagnoses:  1. Primary osteoarthritis of both knees     Large Joint Inj: bilateral knee on 04/24/2024 10:57 AM Indications: pain and diagnostic evaluation Details: 22 G 1.5 in needle, superolateral approach  Arthrogram: No  Medications (Right): 60 mg Sodium Hyaluronate 60 MG/3ML Medications (Left): 60 mg Sodium Hyaluronate 60 MG/3ML Outcome: tolerated well, no immediate complications Procedure, treatment alternatives, risks and benefits explained, specific risks discussed. Consent was given by the patient. Immediately prior to procedure a time out was called to verify the correct patient, procedure, equipment, support staff and site/side marked as required. Patient was prepped and draped in the usual sterile fashion.    The patient comes in today for scheduled hyaluronic acid injections in both knees to treat the pain from osteoarthritis.  These are being performed with Durolane today.  She does report that she is continuing to be a fall risk.  She has been through physical therapy.  She is not ambulating with any type of assistive device in spite of us  encouraging her to use some type of cane or walker.  Her family has done that as well.  Both knees showed no effusion with just slight varus malalignment and pain throughout the arc of motion of both knees.  We did place hyaluronic acid in both knees today.  She would like to come again in 3 months for steroid injection in between.  I once again encouraged her as much as I could to do some type of assistive device given her fall risk.

## 2024-05-22 ENCOUNTER — Encounter: Payer: Self-pay | Admitting: Radiology

## 2024-05-25 ENCOUNTER — Telehealth: Payer: Self-pay

## 2024-05-25 ENCOUNTER — Other Ambulatory Visit (HOSPITAL_COMMUNITY): Payer: Self-pay

## 2024-05-25 ENCOUNTER — Ambulatory Visit: Attending: Internal Medicine | Admitting: Internal Medicine

## 2024-05-25 VITALS — BP 130/70 | HR 59 | Ht 69.0 in | Wt 143.0 lb

## 2024-05-25 DIAGNOSIS — I10 Essential (primary) hypertension: Secondary | ICD-10-CM | POA: Diagnosis not present

## 2024-05-25 DIAGNOSIS — T466X5A Adverse effect of antihyperlipidemic and antiarteriosclerotic drugs, initial encounter: Secondary | ICD-10-CM

## 2024-05-25 DIAGNOSIS — I471 Supraventricular tachycardia, unspecified: Secondary | ICD-10-CM

## 2024-05-25 DIAGNOSIS — M791 Myalgia, unspecified site: Secondary | ICD-10-CM

## 2024-05-25 DIAGNOSIS — T466X5D Adverse effect of antihyperlipidemic and antiarteriosclerotic drugs, subsequent encounter: Secondary | ICD-10-CM

## 2024-05-25 DIAGNOSIS — E782 Mixed hyperlipidemia: Secondary | ICD-10-CM

## 2024-05-25 DIAGNOSIS — I440 Atrioventricular block, first degree: Secondary | ICD-10-CM | POA: Diagnosis not present

## 2024-05-25 DIAGNOSIS — I739 Peripheral vascular disease, unspecified: Secondary | ICD-10-CM | POA: Diagnosis not present

## 2024-05-25 NOTE — Telephone Encounter (Signed)
 Hi Dr. Santo,    Per test claim it needs a PA. Would you like for me to proceed with PA submission?    I can get you a price once the PA determination comes back.

## 2024-05-25 NOTE — Patient Instructions (Signed)
 Medication Instructions:  Your physician recommends that you continue on your current medications as directed. Please refer to the Current Medication list given to you today.  *If you need a refill on your cardiac medications before your next appointment, please call your pharmacy*   Follow-Up: At [facility], you and your health needs are our priority.  As part of our continuing mission to provide you with exceptional heart care, our providers are all part of one team.  This team includes your primary Cardiologist (physician) and Advanced Practice Providers or APPs (Physician Assistants and Nurse Practitioners) who all work together to provide you with the care you need, when you need it.  Your next appointment:   1 year  Provider:   Stanly DELENA Leavens, MD

## 2024-05-25 NOTE — Telephone Encounter (Signed)
-----   Message from Stanly DELENA Leavens sent at 05/25/2024 11:56 AM EST ----- Would you price check [id number]? Thanks, MAC

## 2024-05-25 NOTE — Progress Notes (Signed)
 Cardiology Office Note:    Date:  05/25/2024   ID:  Lindsey Barnett, DOB 30-Jan-1935, MRN [id number]  PCP:  Dwight Trula SQUIBB, MD   [facility] Providers Cardiologist:  Stanly DELENA Leavens, MD     Referring MD: Dwight Trula SQUIBB, MD   CC:  BP follow up  History of Present Illness:    Lindsey Barnett is a 89 y.o. female with a hx of Non cardiac dizziness (extensive work up with no cardiac etiology, decrease in BP meds did not resolve this), PAD (w/o claudication on ASA and statin, HLD and prior TIA, HTN, and P-SVT. 2024: CP control improved of CCB and on Avapro .  Was at goal at [facility] D visit.  Lindsey Barnett is an 89 year old female who presents with gastrointestinal issues.  She has been experiencing gastrointestinal issues, including new-onset difficulty controlling her bowels. She also has nocturia, initially urinating up to five times per night, which has improved to twice per night with dietary changes. She is scheduled to see a gastroenterologist soon (07/2024)   She has been taking medication for her symptoms but is uncertain about its effectiveness. She recalls being given two large tablets.  Her cardiac history includes a comprehensive workup for evaluation of her falls. She has known bradycardia with first-degree heart block, paroxysmal supraventricular tachycardia, and sinus rhythm with first-degree heart block. She is on a low dose of metoprolol  to manage her heart rate.  No high grade AV block.  She has peripheral arterial disease and is on appropriate medications, although she does not tolerate higher doses of statins due to muscle aches and pains. Her last LDL measurement was from the previous year. She has a history of a transient ischemic attack (TIA).  Discussed the use of AI scribe software for clinical note transcription with the patient, who gave verbal consent to proceed.    Past Medical History:  Diagnosis Date   Acute meniscal tear, lateral RIGHT    Arthritis HIP   Cataract immature    GERD (gastroesophageal reflux disease) OCCASIONAL   History of TIA (transient ischemic attack) 2005--  NO RESIDUAL   Hyperlipidemia    Hypertension    MVP (mitral valve prolapse)    Peripheral vascular disease    Stress incontinence    Swelling of right knee joint     Past Surgical History:  Procedure Laterality Date   ANTERIOR APPROACH HEMI HIP ARTHROPLASTY Right 09/22/2015   Procedure: ANTERIOR APPROACH HEMI HIP ARTHROPLASTY;  Surgeon: Norleen Gavel, MD;  Location: [facility] OR;  Service: Orthopedics;  Laterality: Right;   BREAST CYST EXCISION Right    CHOLECYSTECTOMY  1975   KNEE ARTHROSCOPY  09/16/2011   Procedure: ARTHROSCOPY KNEE;  Surgeon: Dempsey LULLA Moan, MD;  Location: [facility];  Service: Orthopedics;  Laterality: Right;  WITH lateral meniscal debridement   TOTAL HIP ARTHROPLASTY Right 03/06/2016   Procedure: CONVERSION RIGHT HIP HEMIARTHROPLASTY TO RIGHT TOTAL HIP ARTHROPLASTY ANTERIOR APPROACH;  Surgeon: Lonni CINDERELLA Poli, MD;  Location: [facility] ORS;  Service: Orthopedics;  Laterality: Right;   VAGINAL HYSTERECTOMY  1973    Current Medications: Current Meds  Medication Sig   acetaminophen  (TYLENOL ) 500 MG tablet Take 1,000 mg by mouth every 6 (six) hours as needed for mild pain.   albuterol (VENTOLIN HFA) 108 (90 Base) MCG/ACT inhaler Inhale 1 puff into the lungs every 6 (six) hours as needed for wheezing or shortness of breath.   amoxicillin (AMOXIL) 500 MG capsule Take  2,000 mg by mouth daily as needed (1 hour prior to dental appointments).   BACTRIM DS 800-160 MG tablet daily.   celecoxib  (CELEBREX ) 100 MG capsule Take 100 mg by mouth as needed.   Cholecalciferol  125 MCG (5000 UT) TABS Take 5,000 Units by mouth daily.   cholestyramine (QUESTRAN) 4 g packet Take 1 packet by mouth daily.   clopidogrel  (PLAVIX ) 75 MG tablet Take 75 mg by mouth daily.   Coenzyme Q10 (COQ10) 200 MG CAPS Take 200 mg by mouth daily.     colesevelam (WELCHOL) 625 MG tablet 2 (two) times daily with a meal.   cyanocobalamin (VITAMIN B12) 1000 MCG tablet Take 1,000 mcg by mouth daily.   ezetimibe  (ZETIA ) 10 MG tablet Take 1 tablet (10 mg total) by mouth daily.   famotidine (PEPCID) 20 MG tablet Take 20 mg by mouth daily.   fluticasone (FLONASE) 50 MCG/ACT nasal spray Place 1 spray into both nostrils daily.   furosemide  (LASIX ) 20 MG tablet Take 20 mg by mouth as needed. Uses about once a month   gabapentin (NEURONTIN) 100 MG capsule Take 100 mg by mouth at bedtime. Takes 1-3 capsules at bedtime   irbesartan  (AVAPRO ) 300 MG tablet Take 1 tablet (300 mg total) by mouth daily.   loperamide (IMODIUM A-D) 2 MG tablet as needed for diarrhea or loose stools.   metoprolol  succinate (TOPROL  XL) 25 MG 24 hr tablet Take 0.5 tablets (12.5 mg total) by mouth daily.   Multiple Vitamin (MULTIVITAMIN) tablet Take 1 tablet by mouth at bedtime.    oxybutynin (DITROPAN-XL) 5 MG 24 hr tablet at bedtime.   pantoprazole  (PROTONIX ) 40 MG tablet Take 1 tablet (40 mg total) by mouth daily. (Patient taking differently: Take 40 mg by mouth as needed (heartburn).)   rosuvastatin  (CRESTOR ) 10 MG tablet Take 10 mg by mouth 3 (three) times a week. MWF   traMADol  (ULTRAM ) 50 MG tablet Take 50 mg by mouth as needed.     Allergies:   Codeine, Contrast media [iodinated contrast media], Iodine, and Sodium tetradecyl sulfate   Social History   Socioeconomic History   Marital status: Married    Spouse name: Not on file   Number of children: Not on file   Years of education: Not on file   Highest education level: Not on file  Occupational History   Not on file  Tobacco Use   Smoking status: Never   Smokeless tobacco: Never  Substance and Sexual Activity   Alcohol use: Not Currently    Comment: OCCASIONAL   Drug use: No   Sexual activity: Not on file  Other Topics Concern   Not on file  Social History Narrative   Not on file   Social Drivers of  Health   Financial Resource Strain: Not on file  Food Insecurity: Not on file  Transportation Needs: Not on file  Physical Activity: Not on file  Stress: Not on file  Social Connections: Not on file    Family History: The patient's family history is negative for Breast cancer.  ROS:   Please see the history of present illness.     EKGs/Labs/Other Studies Reviewed:    The following studies were reviewed today: EKG:   08/27/2022: Sinus bradycardia 1st AV block  Cardiac Studies & Procedures   ______________________________________________________________________________________________   STRESS TESTS  MYOCARDIAL PERFUSION IMAGING 05/15/2021  Interpretation Summary   Findings are consistent with no ischemia. The study is low risk.   No ST deviation was noted.  LV perfusion is normal. There is no evidence of ischemia. There is no evidence of infarction.   Left ventricular function is normal. Nuclear stress EF: 64 %. The left ventricular ejection fraction is normal (55-65%). End diastolic cavity size is normal. End systolic cavity size is normal.   Prior study available for comparison from 10/17/2003. Prior images unable to be viewed, noted EF 75%, small reversible area at the inferior apex  Normal LVEF and wall motion. Small apical inferior/inferolateral perfusion defect at rest that improves with stress, not consistent with ischemia.   ECHOCARDIOGRAM  ECHOCARDIOGRAM COMPLETE 06/23/2019  Narrative ECHOCARDIOGRAM REPORT    Patient Name:   ERZA MOTHERSHEAD Date of Exam: 06/23/2019 Medical Rec #:  [id number]          Height:       64.0 in Accession #:    [id number]         Weight:       158.5 lb Date of Birth:  14-Jul-1935          BSA:          1.77 m Patient Age:    84 years           BP:           148/69 mmHg Patient Gender: F                  HR:           55 bpm. Exam Location:  Inpatient  Procedure: 2D Echo  Indications:    chest pain 786.50  History:         Patient has prior history of Echocardiogram examinations, most recent 10/16/2003. Arrythmias:first degree av block, Signs/Symptoms:Chest Pain; Risk Factors:Dyslipidemia and Hypertension.  Sonographer:    Tinnie Barefoot Referring Phys: [id number] VASUNDHRA RATHORE  IMPRESSIONS   1. Left ventricular ejection fraction, by visual estimation, is 60 to 65%. The left ventricle has normal function. There is no left ventricular hypertrophy. 2. Global right ventricle has normal systolic function.The right ventricular size is normal. No increase in right ventricular wall thickness. 3. Left atrial size was normal. 4. Right atrial size was normal. 5. The mitral valve is normal in structure. No evidence of mitral valve regurgitation. No evidence of mitral stenosis. 6. The tricuspid valve is normal in structure. Tricuspid valve regurgitation is not demonstrated. 7. The aortic valve is normal in structure. Aortic valve regurgitation is not visualized. No evidence of aortic valve sclerosis or stenosis. 8. The pulmonic valve was normal in structure. Pulmonic valve regurgitation is trivial. 9. Normal pulmonary artery systolic pressure. 10. The atrial septum is grossly normal.  FINDINGS Left Ventricle: Left ventricular ejection fraction, by visual estimation, is 60 to 65%. The left ventricle has normal function. There is no left ventricular hypertrophy.  Right Ventricle: The right ventricular size is normal. No increase in right ventricular wall thickness. Global RV systolic function is has normal systolic function. The tricuspid regurgitant velocity is 2.58 m/s, and with an assumed right atrial pressure of 3 mmHg, the estimated right ventricular systolic pressure is normal at 29.6 mmHg.  Left Atrium: Left atrial size was normal in size.  Right Atrium: Right atrial size was normal in size  Pericardium: There is no evidence of pericardial effusion.  Mitral Valve: The mitral valve is normal in structure.  No evidence of mitral valve stenosis by observation. No evidence of mitral valve regurgitation.  Tricuspid Valve: The tricuspid valve is normal in structure. Tricuspid valve regurgitation  is not demonstrated.  Aortic Valve: The aortic valve is normal in structure. Aortic valve regurgitation is not visualized. The aortic valve is structurally normal, with no evidence of sclerosis or stenosis.  Pulmonic Valve: The pulmonic valve was normal in structure. Pulmonic valve regurgitation is trivial.  Aorta: The aortic root and ascending aorta are structurally normal, with no evidence of dilitation.  IAS/Shunts: The atrial septum is grossly normal.   LEFT VENTRICLE PLAX 2D LVIDd:         4.50 cm  Diastology LVIDs:         3.00 cm  LV e' lateral:   5.98 cm/s LV PW:         0.90 cm  LV E/e' lateral: 12.5 LV IVS:        0.80 cm  LV e' medial:    6.09 cm/s LVOT diam:     1.60 cm  LV E/e' medial:  12.3 LV SV:         57 ml LV SV Index:   31.57 LVOT Area:     2.01 cm   RIGHT VENTRICLE RV S prime:     12.60 cm/s TAPSE (M-mode): 2.0 cm  LEFT ATRIUM             Index       RIGHT ATRIUM           Index LA diam:        3.30 cm 1.86 cm/m  RA Area:     10.40 cm LA Vol (A2C):   33.7 ml 19.02 ml/m RA Volume:   19.80 ml  11.17 ml/m LA Vol (A4C):   32.0 ml 18.06 ml/m LA Biplane Vol: 33.7 ml 19.02 ml/m AORTIC VALVE LVOT Vmax:   97.30 cm/s LVOT Vmean:  69.400 cm/s LVOT VTI:    0.237 m  AORTA Ao Root diam: 2.60 cm  MITRAL VALVE                        TRICUSPID VALVE MV Area (PHT): 2.42 cm             TR Peak grad:   26.6 mmHg MV PHT:        90.77 msec           TR Vmax:        258.00 cm/s MV Decel Time: 313 msec MV E velocity: 75.00 cm/s 103 cm/s  SHUNTS MV A velocity: 85.70 cm/s 70.3 cm/s Systemic VTI:  0.24 m MV E/A ratio:  0.88       1.5       Systemic Diam: 1.60 cm   Aleene Passe MD Electronically signed by Aleene Passe MD Signature Date/Time: 06/23/2019/4:00:12 PM    Final     MONITORS  LONG TERM MONITOR (3-14 DAYS) 06/04/2021  Narrative Patch Wear Time:  13 days and 19 hours (2022-10-27T15:31:10-398 to 2022-11-10T09:36:27-0500)  Patient had a min HR of 53 bpm, max HR of 197 bpm, and avg HR of 77 bpm. Predominant underlying rhythm was Sinus Rhythm. First Degree AV Block was present. 60 Supraventricular Tachycardia runs occurred, the run with the fastest interval lasting 18 beats with a max rate of 197 bpm, the longest lasting 20 beats with an avg rate of 113 bpm. Isolated SVEs were rare (<1.0%), SVE Couplets were rare (<1.0%), and SVE Triplets were rare (<1.0%). Isolated VEs were rare (<1.0%), VE Couplets were rare (<1.0%), and no VE Triplets were present.   Conclusion: This study  is remarkable for paroxysmal supraventricular tachycardia which is likely atrial tachycardia.       ______________________________________________________________________________________________        Recent Labs: No results found for requested labs within last 365 days.  Recent Lipid Panel    Component Value Date/Time   CHOL 213 (H) 06/23/2019 0426   TRIG 88 06/23/2019 0426   HDL 67 06/23/2019 0426   CHOLHDL 3.2 06/23/2019 0426   VLDL 18 06/23/2019 0426   LDLCALC 128 (H) 06/23/2019 0426     Physical Exam:    VS:  BP 130/70   Pulse (!) 59   Ht 5' 9 (1.753 m)   Wt 143 lb (64.9 kg)   SpO2 99%   BMI 21.12 kg/m     Wt Readings from Last 3 Encounters:  05/25/24 143 lb (64.9 kg)  02/19/23 155 lb (70.3 kg)  08/27/22 153 lb (69.4 kg)    GEN:  Well nourished, well developed in no acute distress HEENT: Normal NECK: No JVD CARDIAC: Regular, no murmurs, rubs, gallops RESPIRATORY:  Clear to auscultation without rales, wheezing or rhonchi  ABDOMEN: Soft, non-tender, non-distended MUSCULOSKELETAL:  no edema; No deformity but painful for patient on left ankle assessment  SKIN: Warm and dry NEUROLOGIC:  Alert and oriented x 3 PSYCHIATRIC:  Normal affect     ASSESSMENT/PLAN:    Peripheral arterial disease HLD Statin Myopathy Peripheral arterial disease with appropriate medication management. No new cardiac issues identified in recent workup. - Continue current medications for peripheral arterial disease. - Statin-induced myopathy with intolerance to higher doses of statins due to muscle aches and pains. Previous discussion about PCSK9 inhibitors as an alternative was deferred due to other health priorities. - Will price check PCSK9 inhibitor for affordability and potential use.   First degree atrioventricular block and paroxysmal supraventricular tachycardia Bradycardia  - First degree atrioventricular block and paroxysmal supraventricular tachycardia. No evidence of high-grade AV block or atrial fibrillation. Currently on a low dose of metoprolol , which is deemed reasonable for her condition. - Continue current dose of metoprolol .  Essential hypertension - at goal on current therapy  History of transient ischemic attack (TIA) History of transient ischemic attack. Consideration of PCSK9 inhibitors for cholesterol management due to TIA history and statin intolerance.  Pre-op - low risk on the cardiac side issues with cardiac testing given her high functional status.  My team is sending a copy of our notes to her new GI MD prior to potential colonoscopy  One year with me or my team unless new issues  Longitudinal care: The evaluation and management services provided today reflect the complexity inherent in caring for this patient, including the ongoing longitudinal relationship and management of multiple chronic conditions and/or the need for care coordination. The visit required a comprehensive assessment and management plan tailored to the patient's unique needs Time was spent addressing not only the acute concerns but also the broader context of the patient's health, including preventive care, chronic disease management, and care  coordination as appropriate.  Complex longitudinal is necessary for conditions including: secondary prevention in the setting of significant non-cardiac symptoms.       Stanly Leavens, MD FASE [facility] Cardiologist [facility]  [address] [place], [place] [address] [phone]  11:59 AM

## 2024-05-25 NOTE — Telephone Encounter (Signed)
 Pharmacy Patient Advocate Encounter   Received notification from Physician's Office that prior authorization for REPATHA is required/requested.   Insurance verification completed.   The patient is insured through [facility].   Per test claim: PA required; PA submitted to above mentioned insurance via Latent Key/confirmation #/EOC [facility] Status is pending

## 2024-05-30 ENCOUNTER — Other Ambulatory Visit (HOSPITAL_COMMUNITY): Payer: Self-pay

## 2024-05-30 NOTE — Telephone Encounter (Signed)
 Pharmacy Patient Advocate Encounter  Received notification from [employer] that Prior Authorization for REPATHA has been APPROVED from 05/26/24 to 11/23/24. Ran test claim, Copay is $238.22 (DEDUCTIBLE APPLIED TO COPAY). This test claim was processed through [facility]- copay amounts may vary at other pharmacies due to pharmacy/plan contracts, or as the patient moves through the different stages of their insurance plan.

## 2024-05-31 ENCOUNTER — Other Ambulatory Visit (HOSPITAL_COMMUNITY): Payer: Self-pay

## 2024-05-31 NOTE — Telephone Encounter (Signed)
 Happy to assist. Please reach out if you need any additional support or have any other questions/concerns.

## 2024-05-31 NOTE — Telephone Encounter (Addendum)
 My apologies if I did not explain that well. I had to complete the prior authorization on the plan first to get a cost on the insurance before we would be able to do any sort of cost assistance for patient. The Medicare access grants run secondary to the primary medicare drug coverage so the insurance has to be paying for medication before the grant funds can be applied for. I hope that makes sense. Since she does have medicare, we are able to get her a healthwell grant to cover the copay cost on Repatha bringing it to $0.   Nursing team, please send in Repatha prescription to [facility] including grant billing information in RX comment [phone], ERW:[id number], Group: [id number], PI:[id number]).   Please let us  know if you have any additional questions or concerns.

## 2024-07-07 ENCOUNTER — Encounter: Payer: Self-pay | Admitting: Obstetrics

## 2024-07-07 ENCOUNTER — Other Ambulatory Visit (HOSPITAL_COMMUNITY)
Admission: RE | Admit: 2024-07-07 | Discharge: 2024-07-07 | Disposition: A | Discharge status: Home / Self Care | Source: Other Acute Inpatient Hospital | Attending: Obstetrics | Admitting: Obstetrics

## 2024-07-07 ENCOUNTER — Ambulatory Visit: Admitting: Obstetrics

## 2024-07-07 VITALS — BP 143/64 | HR 64 | Ht 64.57 in | Wt 140.0 lb

## 2024-07-07 DIAGNOSIS — R319 Hematuria, unspecified: Secondary | ICD-10-CM | POA: Insufficient documentation

## 2024-07-07 DIAGNOSIS — N952 Postmenopausal atrophic vaginitis: Secondary | ICD-10-CM | POA: Insufficient documentation

## 2024-07-07 DIAGNOSIS — R351 Nocturia: Secondary | ICD-10-CM | POA: Insufficient documentation

## 2024-07-07 DIAGNOSIS — Z90710 Acquired absence of both cervix and uterus: Secondary | ICD-10-CM

## 2024-07-07 DIAGNOSIS — R8290 Unspecified abnormal findings in urine: Secondary | ICD-10-CM | POA: Insufficient documentation

## 2024-07-07 DIAGNOSIS — N3946 Mixed incontinence: Secondary | ICD-10-CM | POA: Diagnosis not present

## 2024-07-07 DIAGNOSIS — R159 Full incontinence of feces: Secondary | ICD-10-CM | POA: Diagnosis not present

## 2024-07-07 DIAGNOSIS — R82998 Other abnormal findings in urine: Secondary | ICD-10-CM | POA: Diagnosis present

## 2024-07-07 DIAGNOSIS — Z87440 Personal history of urinary (tract) infections: Secondary | ICD-10-CM | POA: Insufficient documentation

## 2024-07-07 LAB — POCT URINALYSIS DIP (CLINITEK)
Bilirubin, UA: NEGATIVE
Glucose, UA: NEGATIVE mg/dL
Ketones, POC UA: NEGATIVE mg/dL
Nitrite, UA: NEGATIVE
POC PROTEIN,UA: NEGATIVE
Spec Grav, UA: 1.02
Urobilinogen, UA: 0.2 U/dL
pH, UA: 5.5

## 2024-07-07 LAB — URINALYSIS, COMPLETE (UACMP) WITH MICROSCOPIC
Bilirubin Urine: NEGATIVE
Glucose, UA: NEGATIVE mg/dL
Ketones, ur: NEGATIVE mg/dL
Nitrite: NEGATIVE
Protein, ur: NEGATIVE mg/dL
Specific Gravity, Urine: 1.025 (ref 1.005–1.030)
pH: 5.5 (ref 5.0–8.0)

## 2024-07-07 MED ORDER — TROSPIUM CHLORIDE 20 MG PO TABS: 20.0000 mg | ORAL_TABLET | Freq: Two times a day (BID) | ORAL | 2 refills | Status: AC

## 2024-07-07 MED ORDER — ESTRADIOL 0.01 % VA CREA: TOPICAL_CREAM | VAGINAL | 3 refills | Status: AC

## 2024-07-07 NOTE — Progress Notes (Signed)
 " New Patient Evaluation and Consultation  Referring Provider: Cleotilde, [place]  E, PA PCP: Dwight Trula SQUIBB, MD Date of Service: 07/07/2024  SUBJECTIVE Chief Complaint: New Patient (Initial Visit) Lindsey Barnett is a 89 y.o. female here today for urinary stress incontinence.)  History of Present Illness: Lindsey Barnett is a 89 y.o. White or Caucasian female seen in consultation at the request of PA Cleotilde for evaluation of mixed urinary incontinence.    Leakage with full bladder if she delays urge 1x/week started maybe after childbirth prior to falling.  Symptoms worsened after falling at church 09/24/23 when she was getting out of the car, car started rolling backwards.  Landed left sided arm and shoulder on sidewalk. Presented to the ED the next day and with closed fracture of right radius. Denies numbness when she wipes Pad use started after fall and reports diarrhea. Tried oxybutynin 5mg  daily without relief Reports diarrhea resulted in recurrent UTIs. First fell 6-8 years ago when someone fell on her from stairs and she landed on her left side. 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 Vernetta in 2017, Right hemiarthroplasty from an anterior approach by Dr. Yvone  Urinary Symptoms: Leaks urine with cough/ sneeze, laughing, going from sitting to standing, with movement to the bathroom, with urgency, without sensation, and continuously Leaks 10 time(s) per days. Leaks 6-7x/night Reports minimal leakage with activity Pad use: 6-10 pads per day.   Patient is bothered by UI symptoms.  Day time voids  6-10.  Nocturia: 7-8 times per night to void. Denies snoring or sleep apnea Reports leg swelling, denies compression sock Drinks water  until bedtime Voiding dysfunction:  empties bladder well.  Patient does not use a catheter to empty bladder.  When urinating, patient feels dribbling after finishing and to push on her belly or vagina to empty bladder Drinks: 16oz water  per day, orange juice   UTIs: 8 UTI's in the last year.  UTI symptoms: dysuria.  Denies relief with Azo Denies history of blood in urine, kidney or bladder stones, pyelonephritis, bladder cancer, and kidney cancer Denies H/o bladder or kidney cancer No results found for the last 90 days.   Pelvic Organ Prolapse Symptoms:                  Patient Denies a feeling of a bulge the vaginal area.   Bowel Symptom: Bowel movements: 1-2 time(s) per day Stool consistency: loose/firm Straining: no.  Splinting: no.  Incomplete evacuation: no.  Patient Admits to accidental bowel leakage / fecal incontinence since gallbladder surgery in 1975  Occurs: 1 time(s) per week  Consistency with leakage: liquid Bowel regimen: diet and cholestyramine Last colonoscopy: Date unknown [facility] Colonoscopy   This patient has no relevant Health Maintenance data.     Sexual Function Sexually active: no.  Sexual orientation: Straight Pain with sex: Yes, at the vaginal opening, has discomfort due to dryness  Pelvic Pain Admits to pelvic pain Location: at the bottom Pain occurs: prolonged standing or walking Prior pain treatment: exercises Improved by: rest, elevating her leges Worsened by: standing or walking   Past Medical History:  Past Medical History:  Diagnosis  Date   Acute meniscal tear, lateral RIGHT   Arthritis HIP   Cataract immature    GERD (gastroesophageal reflux disease) OCCASIONAL   History of TIA (transient ischemic attack) 2005--  NO RESIDUAL   Hyperlipidemia    Hypertension    IBS (irritable colon syndrome)     MVP (mitral valve prolapse)    Peripheral vascular disease    Stress incontinence    Swelling of right knee joint      Past Surgical History:   Past Surgical History:  Procedure Laterality Date   ANTERIOR APPROACH HEMI HIP ARTHROPLASTY Right 09/22/2015   Procedure: ANTERIOR APPROACH HEMI HIP ARTHROPLASTY;  Surgeon: Norleen Gavel, MD;  Location: [facility];  Service: Orthopedics;  Laterality: Right;   BREAST CYST EXCISION Right    CHOLECYSTECTOMY  1975   KNEE ARTHROSCOPY  09/16/2011   Procedure: ARTHROSCOPY KNEE;  Surgeon: Dempsey LULLA Moan, MD;  Location: [facility];  Service: Orthopedics;  Laterality: Right;  WITH lateral meniscal debridement   TOTAL HIP ARTHROPLASTY Right 03/06/2016   Procedure: CONVERSION RIGHT HIP HEMIARTHROPLASTY TO RIGHT TOTAL HIP ARTHROPLASTY ANTERIOR APPROACH;  Surgeon: Lonni CINDERELLA Poli, MD;  Location: [facility];  Service: Orthopedics;  Laterality: Right;   VAGINAL HYSTERECTOMY  1973     Past OB/GYN History: OB History  Gravida Para Term Preterm AB Living  4 3 3  1 3   SAB IAB Ectopic Multiple Live Births  1    3    # Outcome Date GA Lbr Len/2nd Weight Sex Type Anes PTL Lv  4 Term     F Vag-Spont   LIV  3 Term     M Vag-Spont   LIV  2 SAB         LIV  1 Term     M Vag-Spont   LIV    Vaginal deliveries: largest infant, 7-8lbs Forceps/ Vacuum deliveries: 0, Cesarean section: 0 Menopausal: Yes, at age unsure, Denies vaginal bleeding since menopause Contraception: s/p vaginal hysterectomy due to pelvic pain with intercourse and bleeding in 1970s, and menopause. Last pap smear was unsure.  Any history of abnormal pap smears: no. No results found for: DIAGPAP, HPVHIGH, ADEQPAP  Medications: Patient has a current medication list which includes the following prescription(s): acetaminophen , albuterol, amoxicillin, celecoxib , cholecalciferol , cholestyramine, clopidogrel , coq10, colesevelam, cyanocobalamin, [START ON 07/10/2024] estradiol, ezetimibe ,  famotidine, fluticasone, furosemide , gabapentin, irbesartan , loperamide, metoprolol  succinate, multivitamin, pantoprazole , rosuvastatin , tramadol , and trospium.   Allergies: Patient is allergic to codeine, contrast media [iodinated contrast media], iodine, and sodium tetradecyl sulfate.   Social History: Social History[1]  Relationship status: married Patient lives with her husband.   Patient is not employed. Regular exercise: No History of abuse: No  Family History:   Family History  Problem Relation Age of Onset   Breast cancer Neg Hx    Bladder Cancer Neg Hx    Uterine cancer Neg Hx    Renal cancer Neg Hx      Review of Systems: Review of Systems  Constitutional:  Positive for malaise/fatigue and weight loss. Negative for fever.  Respiratory:  Positive for cough and shortness of breath. Negative for wheezing.   Cardiovascular:  Positive for leg swelling. Negative for chest pain and palpitations.  Gastrointestinal:  Negative for abdominal pain, blood in stool and constipation.       Leakage  Genitourinary:  Positive for dysuria, frequency and urgency. Negative for hematuria.       Leakage  Skin:  Negative for rash.  Neurological:  Positive for dizziness and weakness. Negative for headaches.  Endo/Heme/Allergies:  Bruises/bleeds easily.       Hot flashes  Psychiatric/Behavioral:  Positive for depression. The patient is nervous/anxious.      OBJECTIVE Physical Exam: Vitals:   07/07/24 1311  BP: (!) 143/64  Pulse: 64  Weight: 140 lb (63.5 kg)  Height: 5' 4.57 (1.64 m)   Physical Exam Constitutional:      General: She is not in acute distress.    Appearance: Normal appearance.  Genitourinary:     Bladder and urethral meatus normal.     No lesions in the vagina.     Right Labia: No rash, tenderness, lesions, skin changes or Bartholin's cyst.    Left Labia: No tenderness, lesions, skin changes, Bartholin's cyst or rash.    No vaginal discharge, erythema,  tenderness, bleeding, ulceration or granulation tissue.     Posterior vaginal prolapse present.    Severe vaginal atrophy present.     Right Adnexa: not tender, not full and no mass present.    Left Adnexa: not tender, not full and no mass present.    Cervix is absent.     Uterus is absent.     Urethral meatus caruncle not present.    No urethral prolapse, tenderness, mass, hypermobility, discharge or stress urinary incontinence with cough stress test present.     Bladder is not tender, urgency on palpation not present and masses not present.      Pelvic Floor: Levator muscle strength is 3/5.    Levator ani not tender, obturator internus not tender, no asymmetrical contractions present and no pelvic spasms present.    Symmetrical pelvic sensation, anal wink present and BC reflex present. Rectum:     Abnormal anal tone present.     No rectal mass, tenderness, external hemorrhoid or rectovaginal septum nodularity.  Cardiovascular:     Rate and Rhythm: Normal rate.  Pulmonary:     Effort: Pulmonary effort is normal. No respiratory distress.  Abdominal:     General: There is no distension.     Palpations: Abdomen is soft. There is no mass.     Tenderness: There is no abdominal tenderness.     Hernia: No hernia is present.   Neurological:     Mental Status: She is alert.  Vitals reviewed. Exam conducted with a chaperone present.      POP-Q:   POP-Q  -3                                            Aa   -3                                           Ba  -7                                              C   1  Gh  2                                            Pb  7                                            tvl   -2                                            Ap  -2                                            Bp                                                 D      Rectal Exam:  Decreased sphincter tone, small distal rectocele,  enterocoele not present, no rectal masses, no sign of dyssynergia when asking the patient to bear down.  Post-Void Residual (PVR) by Bladder Scan: In order to evaluate bladder emptying, we discussed obtaining a postvoid residual and patient agreed to this procedure.  Procedure: The ultrasound unit was placed on the patient's abdomen in the suprapubic region after the patient had voided.    Post Void Residual - 07/07/24 1336       Post Void Residual   Post Void Residual 66 mL          Straight Catheterization Procedure for PVR: After verbal consent was obtained from the patient for catheterization to assess bladder emptying and residual volume the urethra and surrounding tissues were prepped with betadine and an in and out catheterization was performed.  PVR was 60mL.  Urine appeared clear yellow. The patient tolerated the procedure well.   Laboratory Results: Lab Results  Component Value Date   COLORU yellow 07/07/2024   CLARITYU clear 07/07/2024   GLUCOSEUR negative 07/07/2024   BILIRUBINUR NEGATIVE 07/07/2024   SPECGRAV 1.020 07/07/2024   RBCUR trace-lysed (A) 07/07/2024   PHUR 5.5 07/07/2024   PROTEINUR NEGATIVE 07/07/2024   UROBILINOGEN 0.2 07/07/2024   LEUKOCYTESUR SMALL (A) 07/07/2024    Lab Results  Component Value Date   CREATININE 0.77 08/13/2022   CREATININE 0.80 06/22/2019   CREATININE 0.72 03/07/2016    No results found for: HGBA1C  Lab Results  Component Value Date   HGB 13.4 08/13/2022     ASSESSMENT AND PLAN Ms. Mcgann is a 89 y.o. with:  1. Urinary incontinence, mixed   2. History of recurrent UTI (urinary tract infection)   3. Vaginal atrophy   4. Incontinence of feces, unspecified fecal incontinence type   5. Abnormal urinalysis   6. Nocturia     Urinary incontinence, mixed Assessment & Plan: - POCT UA + leuk/heme, pending catheterized UA microscopy and culture. PVR 66mL - worsened after fall in 09/2023 with prior right radial arm  fracture, history of hip fracture, TIA  with increased risk of neurogenic bladder - urgency > stress - started cane use due to frequent falls  - consider bedside commode due to possible functional incontinence - failed oxybutynin 5mg  without relief with dry mouth - We discussed the symptoms of overactive bladder (OAB), which include urinary urgency, urinary frequency, nocturia, with or without urge incontinence.  While we do not know the exact etiology of OAB, several treatment options exist. We discussed management including behavioral therapy (decreasing bladder irritants, urge suppression strategies, timed voids, bladder retraining), physical therapy, medication; for refractory cases posterior tibial nerve stimulation, sacral neuromodulation, and intravesical botulinum toxin injection.  For anticholinergic medications, we discussed the potential side effects of anticholinergics including dry eyes, dry mouth, constipation, cognitive impairment and urinary retention. For Beta-3 agonist medication, we discussed the potential side effect of elevated blood pressure which is more likely to occur in individuals with uncontrolled hypertension. - trial of sanctura, discussed [facility] if cost prohibitive - referral to pelvic floor PT - For treatment of stress urinary incontinence,  non-surgical options include expectant management, weight loss, physical therapy, as well as a pessary.  Surgical options include a midurethral sling, Burch urethropexy, and transurethral injection of a bulking agent. - Rx to start low dose vaginal estrogen - consider urodynamics if refractory symptoms - Cr 0.77 in 08/13/22  Orders: -     Trospium Chloride; Take 1 tablet (20 mg total) by mouth 2 (two) times daily.  Dispense: 60 tablet; Refill: 2 -     AMB referral to rehabilitation -     POCT URINALYSIS DIP (CLINITEK)  History of recurrent UTI (urinary tract infection) Assessment & Plan: - attributed to IBS and  diarrhea - For treatment of recurrent urinary tract infections, we discussed management of recurrent UTIs including prophylaxis with a daily low dose antibiotic, transvaginal estrogen therapy, D-mannose, and cranberry supplements.  We discussed the role of diagnostic testing such as cystoscopy and upper tract imaging.   - Rx to start low dose vaginal estrogen - encouraged to present to the office for bladder testing if change in urinary symptoms to assess urine cultures - consider methenamine for suppression if she continues to have UTIs    Vaginal atrophy Assessment & Plan: - For symptomatic vaginal atrophy options include lubrication with a water -based lubricant, personal hygiene measures and barrier protection against wetness, and estrogen replacement in the form of vaginal cream, vaginal tablets, or a time-released vaginal ring.   - Rx to start low dose vaginal estrogen   Incontinence of feces, unspecified fecal incontinence type Assessment & Plan: - history of diarrhea since cholecystectomy in 1975, uses cholestyramine - Treatment options include anti-diarrhea medication (loperamide/ Imodium OTC or prescription lomotil), fiber supplements, physical therapy, and possible sacral neuromodulation or surgery.  - encouraged Imodium PRN - referral to pelvic floor PT - trial of Trospium   Orders: -     AMB referral to rehabilitation  Abnormal urinalysis Assessment & Plan: - POCT UA + leuk/heme, pending catheterized urine culture. = For management of microscopic hematuria, we discussed the importance of work-up including assessing the upper and lower GU tract with CT urogram and cystoscopy if testing is positive - Rx low dose vaginal estrogen for atrophy  - Cr 0.77 in 08/13/22   Orders: -     Urine Culture; Future -     Urinalysis, Complete w Microscopic; Future  Nocturia Assessment & Plan: - avoid fluid intake 3 hours before bedtime - elevated feet during the day or use  compression socks to reduce lower extremity swelling - switch your diuretic (e.g. furosemide ) dosing to 2pm if needed - trial of sanctura at bedtime    Other orders -     Estradiol; Place 0.5g nightly for two weeks then twice a week after  Dispense: 30 g; Refill: 3  Time spent: I spent 62 minutes dedicated to the care of this patient on the date of this encounter to include pre-visit review of records, face-to-face time with the patient discussing mixed urinary incontinence, recurrent UTI, vaginal atrophy, fecal incontinence, nocturia, abnormal urinalysis, and post visit documentation and ordering medication/ testing.   Lianne ONEIDA Gillis, MD         [1]  Social History Tobacco Use   Smoking status: Never   Smokeless tobacco: Never  Substance Use Topics   Alcohol use: Yes    Comment: OCCASIONAL   Drug use: No   "

## 2024-07-07 NOTE — Assessment & Plan Note (Signed)
-   POCT UA + leuk/heme, pending catheterized urine culture. = For management of microscopic hematuria, we discussed the importance of work-up including assessing the upper and lower GU tract with CT urogram and cystoscopy if testing is positive - Rx low dose vaginal estrogen for atrophy  - Cr 0.77 in 08/13/22

## 2024-07-07 NOTE — Assessment & Plan Note (Signed)
-   For symptomatic vaginal atrophy options include lubrication with a water-based lubricant, personal hygiene measures and barrier protection against wetness, and estrogen replacement in the form of vaginal cream, vaginal tablets, or a time-released vaginal ring.   - Rx to start low dose vaginal estrogen

## 2024-07-07 NOTE — Patient Instructions (Addendum)
 We discussed the symptoms of overactive bladder (OAB), which include urinary urgency, urinary frequency, night-time urination, with or without urge incontinence.  We discussed management including behavioral therapy (decreasing bladder irritants by following a bladder diet, urge suppression strategies, timed voids, bladder retraining), physical therapy, medication; and for refractory cases posterior tibial nerve stimulation, sacral neuromodulation, and intravesical botulinum toxin injection.   For anticholinergic medications, we discussed the potential side effects of anticholinergics including dry eyes, dry mouth, constipation, rare risks of cognitive impairment and urinary retention. You were given prescrition for Trospium.  It can take a month to start working so give it time, but if you have bothersome side effects call sooner and we can try a different medication.  Call us  if you have trouble filling the prescription or if it's not covered by your insurance.  For vaginal atrophy (thinning of the vaginal tissue that can cause dryness and burning) and UTI prevention we discussed estrogen replacement in the form of vaginal cream.   Start vaginal estrogen therapy nightly for two weeks then 2 times weekly at night. This can be placed with your finger or an applicator inside the vagina and around the urethra.  Please let us  know if the prescription is too expensive and we can look for alternative options.   Is vaginal estrogen therapy safe for me? Vaginal estrogen preparations act on the vaginal skin, and only a very tiny amount is absorbed into the bloodstream (0.01%).  They work in a similar way to hand or face cream.  There is minimal absorption and they are therefore perfectly safe. If you have had breast cancer and have persistent troublesome symptoms which aren't settling with vaginal moisturisers and lubricants, local estrogen treatment may be a possibility, but consultation with your oncologist  should take place first.   For treatment of recurrent urinary tract infections, we discussed management of recurrent UTIs including prophylaxis with a daily low dose antibiotic, transvaginal estrogen therapy, D-mannose, and cranberry supplements.  We discussed the role of diagnostic testing such as cystoscopy and upper tract imaging.     Accidental Bowel Leakage:  - Treatment options include anti-diarrhea medication (loperamide/ Imodium OTC or prescription lomotil), fiber supplements, physical therapy, and possible sacral neuromodulation or surgery.     Please call [phone] to schedule the earliest appointment for pelvic floor PT.

## 2024-07-07 NOTE — Assessment & Plan Note (Signed)
-   history of diarrhea since cholecystectomy in 1975, uses cholestyramine - Treatment options include anti-diarrhea medication (loperamide/ Imodium OTC or prescription lomotil), fiber supplements, physical therapy, and possible sacral neuromodulation or surgery.  - encouraged Imodium PRN - referral to pelvic floor PT - trial of Trospium

## 2024-07-07 NOTE — Assessment & Plan Note (Addendum)
-   avoid fluid intake 3 hours before bedtime - elevated feet during the day or use compression socks to reduce lower extremity swelling - switch your diuretic (e.g. furosemide ) dosing to 2pm if needed - trial of sanctura at bedtime

## 2024-07-07 NOTE — Assessment & Plan Note (Signed)
-   attributed to IBS and diarrhea - For treatment of recurrent urinary tract infections, we discussed management of recurrent UTIs including prophylaxis with a daily low dose antibiotic, transvaginal estrogen therapy, D-mannose, and cranberry supplements.  We discussed the role of diagnostic testing such as cystoscopy and upper tract imaging.   - Rx to start low dose vaginal estrogen - encouraged to present to the office for bladder testing if change in urinary symptoms to assess urine cultures - consider methenamine for suppression if she continues to have UTIs

## 2024-07-07 NOTE — Assessment & Plan Note (Addendum)
-   POCT UA + leuk/heme, pending catheterized UA microscopy and culture. PVR 66mL - worsened after fall in 09/2023 with prior right radial arm fracture, history of hip fracture, TIA with increased risk of neurogenic bladder - urgency > stress - started cane use due to frequent falls  - consider bedside commode due to possible functional incontinence - failed oxybutynin 5mg  without relief with dry mouth - We discussed the symptoms of overactive bladder (OAB), which include urinary urgency, urinary frequency, nocturia, with or without urge incontinence.  While we do not know the exact etiology of OAB, several treatment options exist. We discussed management including behavioral therapy (decreasing bladder irritants, urge suppression strategies, timed voids, bladder retraining), physical therapy, medication; for refractory cases posterior tibial nerve stimulation, sacral neuromodulation, and intravesical botulinum toxin injection.  For anticholinergic medications, we discussed the potential side effects of anticholinergics including dry eyes, dry mouth, constipation, cognitive impairment and urinary retention. For Beta-3 agonist medication, we discussed the potential side effect of elevated blood pressure which is more likely to occur in individuals with uncontrolled hypertension. - trial of sanctura, discussed [facility] if cost prohibitive - referral to pelvic floor PT - For treatment of stress urinary incontinence,  non-surgical options include expectant management, weight loss, physical therapy, as well as a pessary.  Surgical options include a midurethral sling, Burch urethropexy, and transurethral injection of a bulking agent. - Rx to start low dose vaginal estrogen - consider urodynamics if refractory symptoms - Cr 0.77 in 08/13/22

## 2024-07-09 LAB — URINE CULTURE: Culture: >=100000 — AB

## 2024-07-10 ENCOUNTER — Ambulatory Visit: Payer: Self-pay | Admitting: Obstetrics

## 2024-07-10 NOTE — Telephone Encounter (Signed)
 Called pt to f/u Repatha start.  Pt expresses unsure about taking an injectable medication.  Would like to know if there is another alternative.  Asked pt would it be beneficial to meet with our pharmacy team to show how to give injection.  Pt expresses it may but would rather not take an injection. Advised pt will send a message to our pharmacy team to reach out for further assistance.

## 2024-07-10 NOTE — Addendum Note (Signed)
 Addended by: Rolly Magri D on: 07/10/2024 05:22 PM   Modules accepted: Orders

## 2024-07-20 ENCOUNTER — Encounter (HOSPITAL_COMMUNITY): Payer: Self-pay

## 2024-07-20 ENCOUNTER — Other Ambulatory Visit: Payer: Self-pay

## 2024-07-20 ENCOUNTER — Emergency Department (HOSPITAL_COMMUNITY)
Admission: EM | Admit: 2024-07-20 | Discharge: 2024-07-20 | Disposition: A | Discharge status: Home / Self Care | Source: Home / Self Care | Attending: Emergency Medicine | Admitting: Emergency Medicine

## 2024-07-20 DIAGNOSIS — Z7902 Long term (current) use of antithrombotics/antiplatelets: Secondary | ICD-10-CM | POA: Diagnosis not present

## 2024-07-20 DIAGNOSIS — S81812A Laceration without foreign body, left lower leg, initial encounter: Secondary | ICD-10-CM | POA: Insufficient documentation

## 2024-07-20 DIAGNOSIS — W268XXA Contact with other sharp object(s), not elsewhere classified, initial encounter: Secondary | ICD-10-CM | POA: Diagnosis not present

## 2024-07-20 DIAGNOSIS — Z79899 Other long term (current) drug therapy: Secondary | ICD-10-CM | POA: Diagnosis not present

## 2024-07-20 DIAGNOSIS — Z23 Encounter for immunization: Secondary | ICD-10-CM | POA: Diagnosis not present

## 2024-07-20 DIAGNOSIS — S8992XA Unspecified injury of left lower leg, initial encounter: Secondary | ICD-10-CM | POA: Diagnosis present

## 2024-07-20 MED ORDER — TETANUS-DIPHTH-ACELL PERTUSSIS 5-2-15.5 LF-MCG/0.5 IM SUSP
0.5000 mL | Freq: Once | INTRAMUSCULAR | Status: AC
  Administered 2024-07-20: 0.5 mL via INTRAMUSCULAR
  Filled 2024-07-20: qty 0.5

## 2024-07-20 NOTE — ED Notes (Signed)
 Discharge instructions reviewed.   Opportunity for questions and concerns provided.   Alert, oriented and ambulatory.   Displays no signs of distress.   Encouraged to leave combat guaze in place for 48 hours.

## 2024-07-20 NOTE — Discharge Instructions (Signed)
 Please leave the dressing on for 48 hours.  After that you can take it off and apply an ointment to the area twice a day.  This could be an antibiotic ointment or Vaseline.  Please return for redness drainage or fever.  Please follow-up with your family doctor in the office.

## 2024-07-20 NOTE — ED Triage Notes (Signed)
 Left lower leg injury. Says she was loading the dish washer and the door struck her shin.   Lots of bleeding. Evaluated and wound care provided by EMS.   After wards bleeding returned. NO bleeding currently.   Compliant on Plavix .

## 2024-07-20 NOTE — ED Provider Notes (Signed)
 " [facility] AT [facility] Provider Note   CSN: [id number] Arrival date & time: 07/20/24  9447     Patient presents with: Leg Injury   Lindsey Barnett is a 89 y.o. female.   89 yo F with a chief complaints of an injury to her left shin.  The patient struck it on the dishwasher.  Had trouble controlling the bleeding.  Is on Plavix .  Had improvement with EMS.  Wound redressed here.  Denies other injury.  Unsure of last tetanus.  Able to ambulate without issue.        Prior to Admission medications  Medication Sig Start Date End Date Taking? Authorizing Provider  acetaminophen  (TYLENOL ) 500 MG tablet Take 1,000 mg by mouth every 6 (six) hours as needed for mild pain.    [provider]  albuterol (VENTOLIN HFA) 108 (90 Base) MCG/ACT inhaler Inhale 1 puff into the lungs every 6 (six) hours as needed for wheezing or shortness of breath.    [provider]  amoxicillin (AMOXIL) 500 MG capsule Take 2,000 mg by mouth daily as needed (1 hour prior to dental appointments).    [provider]  celecoxib  (CELEBREX ) 100 MG capsule Take 100 mg by mouth as needed.    [provider]  Cholecalciferol  125 MCG (5000 UT) TABS Take 5,000 Units by mouth daily.    [provider]  cholestyramine (QUESTRAN) 4 g packet Take 1 packet by mouth daily.    [provider]  clopidogrel  (PLAVIX ) 75 MG tablet Take 75 mg by mouth daily.    [provider]  Coenzyme Q10 (COQ10) 200 MG CAPS Take 200 mg by mouth daily.     [provider]  colesevelam (WELCHOL) 625 MG tablet 2 (two) times daily with a meal. 11/24/23   [provider]  cyanocobalamin (VITAMIN B12) 1000 MCG tablet Take 1,000 mcg by mouth daily.    [provider]  estradiol  (ESTRACE ) 0.01 % CREA vaginal cream Place 0.5g nightly for two weeks then twice a week after 07/10/24   Guadlupe Dull T, MD  ezetimibe  (ZETIA ) 10 MG tablet Take 1  tablet (10 mg total) by mouth daily. 04/18/24   Chandrasekhar, Stanly LABOR, MD  famotidine (PEPCID) 20 MG tablet Take 20 mg by mouth daily.    [provider]  fluticasone (FLONASE) 50 MCG/ACT nasal spray Place 1 spray into both nostrils daily.    [provider]  furosemide  (LASIX ) 20 MG tablet Take 20 mg by mouth as needed. Uses about once a month    [provider]  gabapentin (NEURONTIN) 100 MG capsule Take 100 mg by mouth at bedtime. Takes 1-3 capsules at bedtime    [provider]  irbesartan  (AVAPRO ) 300 MG tablet Take 1 tablet (300 mg total) by mouth daily. 04/18/24   Santo Stanly LABOR, MD  loperamide (IMODIUM A-D) 2 MG tablet as needed for diarrhea or loose stools.    [provider]  metoprolol  succinate (TOPROL  XL) 25 MG 24 hr tablet Take 0.5 tablets (12.5 mg total) by mouth daily. 06/06/21   Tobb, Kardie, DO  Multiple Vitamin (MULTIVITAMIN) tablet Take 1 tablet by mouth at bedtime.     [provider]  pantoprazole  (PROTONIX ) 40 MG tablet Take 1 tablet (40 mg total) by mouth daily. Patient taking differently: Take 40 mg by mouth as needed (heartburn). 06/23/19   Vann, Jessica U, DO  rosuvastatin  (CRESTOR ) 10 MG tablet Take 10 mg  by mouth 3 (three) times a week. MWF    [provider]  traMADol  (ULTRAM ) 50 MG tablet Take 50 mg by mouth as needed.    [provider]  trospium  (SANCTURA ) 20 MG tablet Take 1 tablet (20 mg total) by mouth 2 (two) times daily. 07/07/24   Guadlupe Lianne DASEN, MD    Allergies: Codeine, Contrast media [iodinated contrast media], Iodine, and Sodium tetradecyl sulfate    Review of Systems  Updated Vital Signs BP (!) 167/81   Pulse 69   Temp 98 F (36.7 C) (Oral)   Resp 14   SpO2 100%   Physical Exam Vitals and nursing note reviewed.  Constitutional:      General: She is not in acute distress.    Appearance: She is well-developed. She is not diaphoretic.  HENT:     Head: Normocephalic  and atraumatic.  Eyes:     Pupils: Pupils are equal, round, and reactive to light.  Cardiovascular:     Rate and Rhythm: Normal rate and regular rhythm.     Heart sounds: No murmur heard.    No friction rub. No gallop.  Pulmonary:     Effort: Pulmonary effort is normal.     Breath sounds: No wheezing or rales.  Abdominal:     General: There is no distension.     Palpations: Abdomen is soft.     Tenderness: There is no abdominal tenderness.  Musculoskeletal:        General: No tenderness.     Cervical back: Normal range of motion and neck supple.     Comments: Patient with a skin tear to the left shin.  No obvious crepitus.  There is some venous oozing at a point just left of the tibia.  Most of the skin has been avulsed.  Skin:    General: Skin is warm and dry.  Neurological:     Mental Status: She is alert and oriented to person, place, and time.  Psychiatric:        Behavior: Behavior normal.     (all labs ordered are listed, but only abnormal results are displayed) Labs Reviewed - No data to display  EKG: None  Radiology: No results found.   Procedures   Medications Ordered in the ED  Tdap (ADACEL) injection 0.5 mL (has no administration in time range)                                    Medical Decision Making Risk Prescription drug management.   89 yo F with a chief complaints of a skin tear and difficulty controlling bleeding.  Patient is still having some venous oozing.  Will apply combat gauze.  PCP follow-up.  On record review patient's last tetanus was in 07.  Will update.  6:32 AM:  I have discussed the diagnosis/risks/treatment options with the patient and family.  Evaluation and diagnostic testing in the emergency department does not suggest an emergent condition requiring admission or immediate intervention beyond what has been performed at this time.  They will follow up with PCP. We also discussed returning to the ED immediately if new or  worsening sx occur. We discussed the sx which are most concerning (e.g., sudden worsening pain, fever, inability to tolerate by mouth, redness, drainage) that necessitate immediate return. Medications administered to the patient during their visit and any new prescriptions provided to the patient are listed  below.  Medications given during this visit Medications  Tdap (ADACEL) injection 0.5 mL (has no administration in time range)     The patient appears reasonably screen and/or stabilized for discharge and I doubt any other medical condition or other [facility] requiring further screening, evaluation, or treatment in the ED at this time prior to discharge.       Final diagnoses:  Skin tear of left lower leg without complication, initial encounter    ED Discharge Orders     None          Emil Share, DO 07/20/24 9367  "

## 2024-07-21 ENCOUNTER — Other Ambulatory Visit: Payer: Self-pay | Admitting: Internal Medicine

## 2024-07-24 ENCOUNTER — Encounter: Payer: Self-pay | Admitting: Orthopaedic Surgery

## 2024-07-24 ENCOUNTER — Ambulatory Visit: Admitting: Orthopaedic Surgery

## 2024-07-24 DIAGNOSIS — M25562 Pain in left knee: Secondary | ICD-10-CM | POA: Diagnosis not present

## 2024-07-24 DIAGNOSIS — S81802D Unspecified open wound, left lower leg, subsequent encounter: Secondary | ICD-10-CM

## 2024-07-24 DIAGNOSIS — G8929 Other chronic pain: Secondary | ICD-10-CM | POA: Diagnosis not present

## 2024-07-24 DIAGNOSIS — M25561 Pain in right knee: Secondary | ICD-10-CM

## 2024-07-24 MED ORDER — METHYLPREDNISOLONE ACETATE 40 MG/ML IJ SUSP
40.0000 mg | INTRAMUSCULAR | Status: AC | PRN
  Administered 2024-07-24: 40 mg via INTRA_ARTICULAR

## 2024-07-24 MED ORDER — LIDOCAINE HCL 1 % IJ SOLN
3.0000 mL | INTRAMUSCULAR | Status: AC | PRN
  Administered 2024-07-24: 3 mL

## 2024-07-24 NOTE — Progress Notes (Signed)
 The patient is well-known to me.  She is an 89 year old active female who comes in today for steroid injections in both of her knees to treat the pain from osteoarthritis.  It has been 3 months since she had hyaluronic acid in her knees.  On December 31 she did have a dishwasher door fall on her left leg and this did create a skin tear.  She went to the emergency room on January 1 twice and they were able to do some wound treatment for this area.  She is on Plavix  chronically.  She does report that she would like to have steroid injections of both knees today and I did place a steroid injection of both knees that she tolerated well.  I did have my partner Dr. Harden come around to see her left leg wound and he did make some recommendations for wound care and we will see her back for this left leg wound in a week.  We did take a picture of this area.  She does have a palpable pulse in her left foot.  He did give her instructions for elevation as well.     Procedure Note  Patient: Lindsey Barnett             Date of Birth: 12/12/1934           MRN: [id number]             Visit Date: 07/24/2024  Procedures: Visit Diagnoses:  1. Chronic pain of right knee   2. Chronic pain of left knee     Large Joint Inj: R knee on 07/24/2024 11:01 AM Indications: diagnostic evaluation and pain Details: 22 G 1.5 in needle, superolateral approach  Arthrogram: No  Medications: 3 mL lidocaine  1 %; 40 mg methylPREDNISolone  acetate 40 MG/ML Outcome: tolerated well, no immediate complications Procedure, treatment alternatives, risks and benefits explained, specific risks discussed. Consent was given by the patient. Immediately prior to procedure a time out was called to verify the correct patient, procedure, equipment, support staff and site/side marked as required. Patient was prepped and draped in the usual sterile fashion.    Large Joint Inj: L knee on 07/24/2024 11:01 AM Indications: diagnostic evaluation and  pain Details: 22 G 1.5 in needle, superolateral approach  Arthrogram: No  Medications: 3 mL lidocaine  1 %; 40 mg methylPREDNISolone  acetate 40 MG/ML Outcome: tolerated well, no immediate complications Procedure, treatment alternatives, risks and benefits explained, specific risks discussed. Consent was given by the patient. Immediately prior to procedure a time out was called to verify the correct patient, procedure, equipment, support staff and site/side marked as required. Patient was prepped and draped in the usual sterile fashion.

## 2024-07-31 ENCOUNTER — Encounter: Payer: Self-pay | Admitting: Orthopedic Surgery

## 2024-07-31 ENCOUNTER — Ambulatory Visit: Admitting: Orthopedic Surgery

## 2024-07-31 ENCOUNTER — Encounter: Payer: Self-pay | Admitting: *Deleted

## 2024-07-31 DIAGNOSIS — L97921 Non-pressure chronic ulcer of unspecified part of left lower leg limited to breakdown of skin: Secondary | ICD-10-CM | POA: Diagnosis not present

## 2024-07-31 NOTE — Progress Notes (Signed)
 "  Office Visit Note   Patient: Lindsey Barnett           Date of Birth: 10/10/1934           MRN: [id number] Visit Date: 07/31/2024              Requested by: Dwight Trula SQUIBB, MD [address] [place],  [place] [address] PCP: Dwight Trula SQUIBB, MD  Chief Complaint  Patient presents with   Left Leg - Follow-up      HPI: Discussed the use of AI scribe software for clinical note transcription with the patient, who gave verbal consent to proceed.  History of Present Illness Lindsey Barnett is an 89 year old female with varicose veins of the left lower extremity complicated by a traumatic venous ulcer who presents for ongoing wound care and pain management.  She reports severe, throbbing pain localized to the left leg at the site of the venous ulcer. The pain is aggravated by attempts to apply compression socks, which she finds excessively tight and uncomfortable over the wound.  She notes persistent swelling and dark blue discoloration around the wound, consistent with underlying venous insufficiency and prior dressings. She observes a scab upon dressing changes and reports that the skin is wrinkling. She expresses concern regarding the healing trajectory and reports generalized fatigue.     Assessment & Plan: Visit Diagnoses:  1. Traumatic ulcer of left lower leg, limited to breakdown of skin (HCC)     Plan: Assessment and Plan Assessment & Plan Varicose veins of left lower extremity with ulcer Chronic varicose veins with a healing traumatic venous ulcer. Compression therapy is essential for healing and edema control. - Reapplied compression wrap with Vashe 4x4 gauze and multilayer compression. - Scheduled follow-up in one week to reassess wound healing.      Follow-Up Instructions: Return in about 1 week (around 08/07/2024).   Ortho Exam  Patient is alert, oriented, no adenopathy, well-dressed, normal affect, normal respiratory effort. Physical Exam SKIN:  Traumatic venous ulcer on left leg under multilayer compression. Healthy granulation tissue at wound base, 2x2.5 cm. Good wrinkling of the skin. Presence of varicose veins.  No cellulitis no drainage no signs of infection      Imaging: No results found. No images are attached to the encounter.  Labs: Lab Results  Component Value Date   REPTSTATUS 07/09/2024 FINAL 07/07/2024   CULT >=100,000 COLONIES/mL ESCHERICHIA COLI (A) 07/07/2024   LABORGA ESCHERICHIA COLI (A) 07/07/2024     Lab Results  Component Value Date   ALBUMIN 3.5 09/22/2015    No results found for: MG No results found for: VD25OH  No results found for: PREALBUMIN    Latest Ref Rng & Units 08/13/2022   12:16 PM 06/22/2019    3:26 PM 03/07/2016    4:41 AM  CBC EXTENDED  WBC 4.0 - 10.5 K/uL 7.8  7.6  7.2   RBC 3.87 - 5.11 MIL/uL 4.76  4.58  4.00   Hemoglobin 12.0 - 15.0 g/dL 86.5  86.2  88.6   HCT 36.0 - 46.0 % 42.9  42.0  34.8   Platelets 150 - 400 K/uL 302  247  227      There is no height or weight on file to calculate BMI.  Orders:  No orders of the defined types were placed in this encounter.  No orders of the defined types were placed in this encounter.    Procedures: No procedures performed  Clinical Data: No additional findings.  ROS:  All other systems negative, except as noted in the HPI. Review of Systems  Objective: Vital Signs: There were no vitals taken for this visit.  Specialty Comments:  No specialty comments available.  PMFS History: Patient Active Problem List   Diagnosis Date Noted   Urinary incontinence, mixed 07/07/2024   History of recurrent UTI (urinary tract infection) 07/07/2024   Vaginal atrophy 07/07/2024   Incontinence of feces 07/07/2024   Abnormal urinalysis 07/07/2024   Nocturia 07/07/2024   Osteoarthritis of knees, bilateral 01/31/2024   Paroxysmal SVT (supraventricular tachycardia) 02/19/2023   Myalgia due to statin 02/19/2023   Dizziness  09/12/2021   Overweight (BMI 25.0-29.9) 09/12/2021   Chest pain 06/22/2019   First degree AV block 06/22/2019   HLD (hyperlipidemia) 06/22/2019   PAD (peripheral artery disease) 06/22/2019   Impingement syndrome of right shoulder 12/22/2017   Sciatica, left side 09/08/2017   Pain due to right hip joint prosthesis (HCC) 03/06/2016   Status post total replacement of right hip 03/06/2016   Hip fracture requiring operative repair (HCC) 09/22/2015   Hip injury    Subcapital fracture of hip (HCC) 09/21/2015   Hypokalemia 09/21/2015   HTN (hypertension) 09/21/2015   GERD (gastroesophageal reflux disease) 09/21/2015   Lateral meniscal tear 09/16/2011   Past Medical History:  Diagnosis Date   Acute meniscal tear, lateral RIGHT   Arthritis HIP   Cataract immature    GERD (gastroesophageal reflux disease) OCCASIONAL   History of TIA (transient ischemic attack) 2005--  NO RESIDUAL   Hyperlipidemia    Hypertension    IBS (irritable colon syndrome)    MVP (mitral valve prolapse)    Peripheral vascular disease    Stress incontinence    Swelling of right knee joint     Family History  Problem Relation Age of Onset   Breast cancer Neg Hx    Bladder Cancer Neg Hx    Uterine cancer Neg Hx    Renal cancer Neg Hx     Past Surgical History:  Procedure Laterality Date   ANTERIOR APPROACH HEMI HIP ARTHROPLASTY Right 09/22/2015   Procedure: ANTERIOR APPROACH HEMI HIP ARTHROPLASTY;  Surgeon: Norleen Gavel, MD;  Location: [facility] OR;  Service: Orthopedics;  Laterality: Right;   BREAST CYST EXCISION Right    CHOLECYSTECTOMY  1975   KNEE ARTHROSCOPY  09/16/2011   Procedure: ARTHROSCOPY KNEE;  Surgeon: Dempsey LULLA Moan, MD;  Location[facility];  Service: Orthopedics;  Laterality: Right;  WITH lateral meniscal debridement   TOTAL HIP ARTHROPLASTY Right 03/06/2016   Procedure: CONVERSION RIGHT HIP HEMIARTHROPLASTY TO RIGHT TOTAL HIP ARTHROPLASTY ANTERIOR APPROACH;  Surgeon: Lonni CINDERELLA Poli, MD;  Location: [facility];  Service: Orthopedics;  Laterality: Right;   VAGINAL HYSTERECTOMY  1973   Social History   Occupational History   Not on file  Tobacco Use   Smoking status: Never   Smokeless tobacco: Never  Substance and Sexual Activity   Alcohol use: Yes    Comment: OCCASIONAL   Drug use: No   Sexual activity: Not Currently    Partners: Male    Birth control/protection: None         "

## 2024-08-01 ENCOUNTER — Ambulatory Visit: Admitting: Obstetrics

## 2024-08-07 ENCOUNTER — Encounter: Payer: Self-pay | Admitting: Orthopedic Surgery

## 2024-08-07 ENCOUNTER — Ambulatory Visit: Admitting: Orthopedic Surgery

## 2024-08-07 DIAGNOSIS — L97921 Non-pressure chronic ulcer of unspecified part of left lower leg limited to breakdown of skin: Secondary | ICD-10-CM

## 2024-08-07 NOTE — Progress Notes (Signed)
 "  Office Visit Note   Patient: Lindsey Barnett           Date of Birth: 1934/10/05           MRN: [id number] Visit Date: 08/07/2024              Requested by: Dwight Trula SQUIBB, MD [address] [place],  [place] [address] PCP: Dwight Trula SQUIBB, MD  Chief Complaint  Patient presents with   Left Leg - Wound Check      HPI: Discussed the use of AI scribe software for clinical note transcription with the patient, who gave verbal consent to proceed.  History of Present Illness Lindsey Barnett is an 89 year old female with a chronic left lower leg ulcer who presents for follow-up of wound healing.  She has a chronic ulcer of the left lower leg. The wound has decreased in size from 2.5 x 2 cm at the last visit to 2.5 x 1.5 cm currently, per prior and current measurements. She notes the wound appears smaller but expresses concern regarding the prolonged healing time and inquires if this duration is typical.  Wound care has consisted of Vashe dressing and Dynaflex wrap, with continued efforts to keep the dressing dry and avoid moisture exposure.     Assessment & Plan: Visit Diagnoses:  1. Traumatic ulcer of left lower leg, limited to breakdown of skin (HCC)     Plan: Assessment and Plan Assessment & Plan Chronic ulcer of left lower leg Chronic ulcer improving with decreased size and improved granulation. Mild maceration from Vashe dressing. No cellulitis. - Measured wound dimensions and assessed for infection or cellulitis. - Reapplied Vashe 4x4 dressing and Dynaflex wrap. - Instructed her to keep the dressing dry. - Scheduled follow-up in one week.      Follow-Up Instructions: No follow-ups on file.   Ortho Exam  Patient is alert, oriented, no adenopathy, well-dressed, normal affect, normal respiratory effort. Physical Exam SKIN: Wound improved, measures 2.5x1.5 cm, with improved granulation tissue. Mild maceration from Vashe dressing. No surrounding  cellulitis.   Wound measured 2.5 x 2 cm last week   Imaging: No results found. No images are attached to the encounter.  Labs: Lab Results  Component Value Date   REPTSTATUS 07/09/2024 FINAL 07/07/2024   CULT >=100,000 COLONIES/mL ESCHERICHIA COLI (A) 07/07/2024   LABORGA ESCHERICHIA COLI (A) 07/07/2024     Lab Results  Component Value Date   ALBUMIN 3.5 09/22/2015    No results found for: MG No results found for: VD25OH  No results found for: PREALBUMIN    Latest Ref Rng & Units 08/13/2022   12:16 PM 06/22/2019    3:26 PM 03/07/2016    4:41 AM  CBC EXTENDED  WBC 4.0 - 10.5 K/uL 7.8  7.6  7.2   RBC 3.87 - 5.11 MIL/uL 4.76  4.58  4.00   Hemoglobin 12.0 - 15.0 g/dL 86.5  86.2  88.6   HCT 36.0 - 46.0 % 42.9  42.0  34.8   Platelets 150 - 400 K/uL 302  247  227      There is no height or weight on file to calculate BMI.  Orders:  No orders of the defined types were placed in this encounter.  No orders of the defined types were placed in this encounter.    Procedures: No procedures performed  Clinical Data: No additional findings.  ROS:  All other systems negative, except as noted in  the HPI. Review of Systems  Objective: Vital Signs: There were no vitals taken for this visit.  Specialty Comments:  No specialty comments available.  PMFS History: Patient Active Problem List   Diagnosis Date Noted   Urinary incontinence, mixed 07/07/2024   History of recurrent UTI (urinary tract infection) 07/07/2024   Vaginal atrophy 07/07/2024   Incontinence of feces 07/07/2024   Abnormal urinalysis 07/07/2024   Nocturia 07/07/2024   Osteoarthritis of knees, bilateral 01/31/2024   Paroxysmal SVT (supraventricular tachycardia) 02/19/2023   Myalgia due to statin 02/19/2023   Dizziness 09/12/2021   Overweight (BMI 25.0-29.9) 09/12/2021   Chest pain 06/22/2019   First degree AV block 06/22/2019   HLD (hyperlipidemia) 06/22/2019   PAD (peripheral artery  disease) 06/22/2019   Impingement syndrome of right shoulder 12/22/2017   Sciatica, left side 09/08/2017   Pain due to right hip joint prosthesis (HCC) 03/06/2016   Status post total replacement of right hip 03/06/2016   Hip fracture requiring operative repair (HCC) 09/22/2015   Hip injury    Subcapital fracture of hip (HCC) 09/21/2015   Hypokalemia 09/21/2015   HTN (hypertension) 09/21/2015   GERD (gastroesophageal reflux disease) 09/21/2015   Lateral meniscal tear 09/16/2011   Past Medical History:  Diagnosis Date   Acute meniscal tear, lateral RIGHT   Arthritis HIP   Cataract immature    GERD (gastroesophageal reflux disease) OCCASIONAL   History of TIA (transient ischemic attack) 2005--  NO RESIDUAL   Hyperlipidemia    Hypertension    IBS (irritable colon syndrome)    MVP (mitral valve prolapse)    Peripheral vascular disease    Stress incontinence    Swelling of right knee joint     Family History  Problem Relation Age of Onset   Breast cancer Neg Hx    Bladder Cancer Neg Hx    Uterine cancer Neg Hx    Renal cancer Neg Hx     Past Surgical History:  Procedure Laterality Date   ANTERIOR APPROACH HEMI HIP ARTHROPLASTY Right 09/22/2015   Procedure: ANTERIOR APPROACH HEMI HIP ARTHROPLASTY;  Surgeon: Norleen Gavel, MD;  Location: [facility];  Service: Orthopedics;  Laterality: Right;   BREAST CYST EXCISION Right    CHOLECYSTECTOMY  1975   KNEE ARTHROSCOPY  09/16/2011   Procedure: ARTHROSCOPY KNEE;  Surgeon: Dempsey LULLA Moan, MD;  Location: [facility];  Service: Orthopedics;  Laterality: Right;  WITH lateral meniscal debridement   TOTAL HIP ARTHROPLASTY Right 03/06/2016   Procedure: CONVERSION RIGHT HIP HEMIARTHROPLASTY TO RIGHT TOTAL HIP ARTHROPLASTY ANTERIOR APPROACH;  Surgeon: Lonni CINDERELLA Poli, MD;  Location: [facility];  Service: Orthopedics;  Laterality: Right;   VAGINAL HYSTERECTOMY  1973   Social History   Occupational History   Not on file  Tobacco Use    Smoking status: Never   Smokeless tobacco: Never  Substance and Sexual Activity   Alcohol use: Yes    Comment: OCCASIONAL   Drug use: No   Sexual activity: Not Currently    Partners: Male    Birth control/protection: None         "

## 2024-08-14 ENCOUNTER — Ambulatory Visit: Admitting: Orthopedic Surgery

## 2024-08-15 ENCOUNTER — Ambulatory Visit: Admitting: Orthopedic Surgery

## 2024-08-16 ENCOUNTER — Ambulatory Visit: Admitting: Family

## 2024-08-16 ENCOUNTER — Telehealth: Payer: Self-pay | Admitting: Orthopedic Surgery

## 2024-08-16 NOTE — Telephone Encounter (Signed)
 Pt called saying she can't come to her apt because her driveway ins iced over and is reluctant to reschedule with either Duda or Zamora. She wants to speak to Autumn. Call back number is [phone].

## 2024-08-16 NOTE — Telephone Encounter (Signed)
 I called and sw pt and she said that ice has her blocked in her driveway. I asked the pt if she felt comfortable removing the profore dressing and she said that she did. I advised that she could wear a compression sock or use an ace to wrap the leg daily and we would see her in the office on 08/22/2024. She voiced understanding and will call me if she has any questions.

## 2024-08-22 ENCOUNTER — Ambulatory Visit: Admitting: Orthopedic Surgery

## 2024-08-29 ENCOUNTER — Ambulatory Visit: Admitting: Orthopedic Surgery

## 2024-09-19 ENCOUNTER — Ambulatory Visit: Admitting: Pharmacist Clinician (PhC)/ Clinical Pharmacy Specialist

## 2024-09-28 ENCOUNTER — Encounter: Admitting: Physical Therapy

## 2024-10-05 ENCOUNTER — Encounter: Admitting: Physical Therapy

## 2024-10-09 ENCOUNTER — Ambulatory Visit: Admitting: Obstetrics

## 2024-10-12 ENCOUNTER — Encounter: Admitting: Physical Therapy

## 2024-10-19 ENCOUNTER — Encounter: Admitting: Physical Therapy

## 2024-10-23 ENCOUNTER — Ambulatory Visit: Admitting: Orthopaedic Surgery

## 2024-10-26 ENCOUNTER — Encounter: Admitting: Physical Therapy
